# Patient Record
Sex: Male | Born: 1945 | Hispanic: No | Marital: Single | Smoking: Never smoker
Health system: Southern US, Community
[De-identification: ages and names within clinical notes are randomized; demographics above are authoritative.]

## PROBLEM LIST (undated history)

## (undated) DIAGNOSIS — D332 Benign neoplasm of brain, unspecified: Secondary | ICD-10-CM

---

## 2017-08-26 ENCOUNTER — Other Ambulatory Visit: Payer: Self-pay

## 2017-08-26 ENCOUNTER — Inpatient Hospital Stay
Admission: EM | Admit: 2017-08-26 | Discharge: 2017-09-09 | DRG: 100 | Disposition: A | Discharge status: Skilled Nursing Facility | Payer: Medicare Other | Attending: Internal Medicine | Admitting: Internal Medicine

## 2017-08-26 ENCOUNTER — Emergency Department: Payer: Medicare Other

## 2017-08-26 ENCOUNTER — Encounter: Payer: Self-pay | Admitting: *Deleted

## 2017-08-26 DIAGNOSIS — R569 Unspecified convulsions: Secondary | ICD-10-CM

## 2017-08-26 DIAGNOSIS — R0902 Hypoxemia: Secondary | ICD-10-CM

## 2017-08-26 DIAGNOSIS — G40901 Epilepsy, unspecified, not intractable, with status epilepticus: Principal | ICD-10-CM

## 2017-08-26 DIAGNOSIS — G6281 Critical illness polyneuropathy: Secondary | ICD-10-CM | POA: Diagnosis present

## 2017-08-26 DIAGNOSIS — R45851 Suicidal ideations: Secondary | ICD-10-CM | POA: Diagnosis not present

## 2017-08-26 DIAGNOSIS — F4323 Adjustment disorder with mixed anxiety and depressed mood: Secondary | ICD-10-CM

## 2017-08-26 DIAGNOSIS — G7281 Critical illness myopathy: Secondary | ICD-10-CM | POA: Diagnosis present

## 2017-08-26 DIAGNOSIS — F341 Dysthymic disorder: Secondary | ICD-10-CM

## 2017-08-26 DIAGNOSIS — R1310 Dysphagia, unspecified: Secondary | ICD-10-CM | POA: Diagnosis present

## 2017-08-26 DIAGNOSIS — I959 Hypotension, unspecified: Secondary | ICD-10-CM | POA: Diagnosis present

## 2017-08-26 DIAGNOSIS — Z931 Gastrostomy status: Secondary | ICD-10-CM

## 2017-08-26 DIAGNOSIS — N390 Urinary tract infection, site not specified: Secondary | ICD-10-CM | POA: Diagnosis present

## 2017-08-26 DIAGNOSIS — N179 Acute kidney failure, unspecified: Secondary | ICD-10-CM | POA: Diagnosis present

## 2017-08-26 DIAGNOSIS — Z4659 Encounter for fitting and adjustment of other gastrointestinal appliance and device: Secondary | ICD-10-CM

## 2017-08-26 DIAGNOSIS — Z7902 Long term (current) use of antithrombotics/antiplatelets: Secondary | ICD-10-CM

## 2017-08-26 DIAGNOSIS — G9341 Metabolic encephalopathy: Secondary | ICD-10-CM | POA: Diagnosis present

## 2017-08-26 DIAGNOSIS — Z0189 Encounter for other specified special examinations: Secondary | ICD-10-CM

## 2017-08-26 DIAGNOSIS — Z01818 Encounter for other preprocedural examination: Secondary | ICD-10-CM

## 2017-08-26 DIAGNOSIS — Z1389 Encounter for screening for other disorder: Secondary | ICD-10-CM

## 2017-08-26 DIAGNOSIS — T17908A Unspecified foreign body in respiratory tract, part unspecified causing other injury, initial encounter: Secondary | ICD-10-CM

## 2017-08-26 DIAGNOSIS — Z79899 Other long term (current) drug therapy: Secondary | ICD-10-CM

## 2017-08-26 DIAGNOSIS — Z9114 Patient's other noncompliance with medication regimen: Secondary | ICD-10-CM

## 2017-08-26 DIAGNOSIS — Z7984 Long term (current) use of oral hypoglycemic drugs: Secondary | ICD-10-CM

## 2017-08-26 DIAGNOSIS — E1165 Type 2 diabetes mellitus with hyperglycemia: Secondary | ICD-10-CM | POA: Diagnosis present

## 2017-08-26 DIAGNOSIS — R531 Weakness: Secondary | ICD-10-CM | POA: Diagnosis not present

## 2017-08-26 DIAGNOSIS — Z882 Allergy status to sulfonamides status: Secondary | ICD-10-CM

## 2017-08-26 DIAGNOSIS — F0280 Dementia in other diseases classified elsewhere without behavioral disturbance: Secondary | ICD-10-CM

## 2017-08-26 DIAGNOSIS — R0600 Dyspnea, unspecified: Secondary | ICD-10-CM

## 2017-08-26 DIAGNOSIS — J9690 Respiratory failure, unspecified, unspecified whether with hypoxia or hypercapnia: Secondary | ICD-10-CM

## 2017-08-26 DIAGNOSIS — Z452 Encounter for adjustment and management of vascular access device: Secondary | ICD-10-CM

## 2017-08-26 DIAGNOSIS — J9600 Acute respiratory failure, unspecified whether with hypoxia or hypercapnia: Secondary | ICD-10-CM

## 2017-08-26 DIAGNOSIS — R0989 Other specified symptoms and signs involving the circulatory and respiratory systems: Secondary | ICD-10-CM

## 2017-08-26 DIAGNOSIS — D496 Neoplasm of unspecified behavior of brain: Secondary | ICD-10-CM | POA: Diagnosis present

## 2017-08-26 DIAGNOSIS — I10 Essential (primary) hypertension: Secondary | ICD-10-CM | POA: Diagnosis present

## 2017-08-26 DIAGNOSIS — F05 Delirium due to known physiological condition: Secondary | ICD-10-CM | POA: Diagnosis not present

## 2017-08-26 DIAGNOSIS — R739 Hyperglycemia, unspecified: Secondary | ICD-10-CM

## 2017-08-26 DIAGNOSIS — T17908D Unspecified foreign body in respiratory tract, part unspecified causing other injury, subsequent encounter: Secondary | ICD-10-CM

## 2017-08-26 DIAGNOSIS — R0602 Shortness of breath: Secondary | ICD-10-CM

## 2017-08-26 DIAGNOSIS — B952 Enterococcus as the cause of diseases classified elsewhere: Secondary | ICD-10-CM | POA: Diagnosis present

## 2017-08-26 DIAGNOSIS — J9601 Acute respiratory failure with hypoxia: Secondary | ICD-10-CM | POA: Diagnosis present

## 2017-08-26 DIAGNOSIS — E785 Hyperlipidemia, unspecified: Secondary | ICD-10-CM | POA: Diagnosis present

## 2017-08-26 HISTORY — DX: Benign neoplasm of brain, unspecified: D33.2

## 2017-08-26 LAB — BASIC METABOLIC PANEL
ANION GAP: 20 — AB (ref 5–15)
BUN: 17 mg/dL (ref 6–20)
CO2: 16 mmol/L — AB (ref 22–32)
Calcium: 9.1 mg/dL (ref 8.9–10.3)
Chloride: 102 mmol/L (ref 101–111)
Creatinine, Ser: 1.34 mg/dL — ABNORMAL HIGH (ref 0.61–1.24)
GFR calc Af Amer: 60 mL/min — ABNORMAL LOW (ref >60)
GFR calc non Af Amer: 52 mL/min — ABNORMAL LOW (ref >60)
GLUCOSE: 445 mg/dL — AB (ref 65–99)
POTASSIUM: 3.8 mmol/L (ref 3.5–5.1)
Sodium: 138 mmol/L (ref 135–145)

## 2017-08-26 LAB — CBC WITH DIFFERENTIAL/PLATELET
Basophils Absolute: 0.1 10*3/uL (ref 0–0.1)
Basophils Relative: 1 %
EOS ABS: 0.2 10*3/uL (ref 0–0.7)
Eosinophils Relative: 2 %
HCT: 48.8 % (ref 40.0–52.0)
HEMOGLOBIN: 16.2 g/dL (ref 13.0–18.0)
LYMPHS PCT: 30 %
Lymphs Abs: 3.4 10*3/uL (ref 1.0–3.6)
MCH: 29.8 pg (ref 26.0–34.0)
MCHC: 33.2 g/dL (ref 32.0–36.0)
MCV: 89.8 fL (ref 80.0–100.0)
MONO ABS: 0.8 10*3/uL (ref 0.2–1.0)
MONOS PCT: 7 %
NEUTROS ABS: 6.8 10*3/uL — AB (ref 1.4–6.5)
Neutrophils Relative %: 60 %
Platelets: 212 10*3/uL (ref 150–440)
RBC: 5.43 MIL/uL (ref 4.40–5.90)
RDW: 13.4 % (ref 11.5–14.5)
WBC: 11.3 10*3/uL — ABNORMAL HIGH (ref 3.8–10.6)

## 2017-08-26 LAB — PROTIME-INR
INR: 1
PROTHROMBIN TIME: 13.1 s (ref 11.4–15.2)

## 2017-08-26 LAB — GLUCOSE, CAPILLARY: GLUCOSE-CAPILLARY: 297 mg/dL — AB (ref 65–99)

## 2017-08-26 LAB — APTT: aPTT: 26 seconds (ref 24–36)

## 2017-08-26 MED ORDER — LORAZEPAM 2 MG/ML IJ SOLN
INTRAMUSCULAR | Status: AC
  Administered 2017-08-26: 2 mg via INTRAVENOUS
  Filled 2017-08-26: qty 1

## 2017-08-26 MED ORDER — LORAZEPAM 2 MG/ML IJ SOLN
INTRAMUSCULAR | Status: AC
  Administered 2017-08-26: 2 mg
  Filled 2017-08-26: qty 1

## 2017-08-26 MED ORDER — SODIUM CHLORIDE 0.9 % IV SOLN
2000.0000 mg | Freq: Once | INTRAVENOUS | Status: AC
  Administered 2017-08-26: 2000 mg via INTRAVENOUS
  Filled 2017-08-26: qty 40

## 2017-08-26 MED ORDER — ROCURONIUM BROMIDE 50 MG/5ML IV SOLN: 1.0000 mg/kg | Freq: Once | INTRAVENOUS | Status: DC

## 2017-08-26 MED ORDER — PROPOFOL 10 MG/ML IV BOLUS
200.0000 mg | Freq: Once | INTRAVENOUS | Status: AC
  Administered 2017-08-26: 200 mg via INTRAVENOUS

## 2017-08-26 MED ORDER — ROCURONIUM BROMIDE 50 MG/5ML IV SOLN
100.0000 mg | Freq: Once | INTRAVENOUS | Status: AC
  Administered 2017-08-26: 100 mg via INTRAVENOUS

## 2017-08-26 MED ORDER — SODIUM CHLORIDE 0.9 % IV BOLUS (SEPSIS)
1000.0000 mL | Freq: Once | INTRAVENOUS | Status: AC
  Administered 2017-08-26: 1000 mL via INTRAVENOUS

## 2017-08-26 MED ORDER — PROPOFOL 1000 MG/100ML IV EMUL
5.0000 ug/kg/min | Freq: Once | INTRAVENOUS | Status: AC
  Administered 2017-08-26: 20 ug/kg/min via INTRAVENOUS

## 2017-08-26 MED ORDER — LORAZEPAM 2 MG/ML IJ SOLN
2.0000 mg | Freq: Once | INTRAMUSCULAR | Status: AC
  Administered 2017-08-26: 2 mg via INTRAVENOUS

## 2017-08-26 MED ORDER — PROPOFOL 1000 MG/100ML IV EMUL
INTRAVENOUS | Status: AC
Start: 2017-08-26 — End: 2017-08-26
  Administered 2017-08-26: 20 ug/kg/min via INTRAVENOUS
  Filled 2017-08-26: qty 100

## 2017-08-26 MED ORDER — SODIUM CHLORIDE 0.9 % IV SOLN
Freq: Once | INTRAVENOUS | Status: DC
Start: 2017-08-27 — End: 2017-08-27

## 2017-08-26 NOTE — ED Triage Notes (Signed)
Per EMS Pt was at [employer] in rest room and bystander heard him fall and opened stall to see pt having seizure like activity. 911 was called and they arrived to pt postictal. Pt now states no seizure or stoke history. EMS reports he is improving each minute with memory and behavior. Stroke screen in field WNL. Pt is diabetic glucose was 384.

## 2017-08-26 NOTE — ED Notes (Signed)
Rocuronium Bromide administered/given at 2332

## 2017-08-26 NOTE — ED Provider Notes (Signed)
[facility] Emergency Department Provider Note  ____________________________________________  Time seen: Approximately 10:22 PM  I have reviewed the triage vital signs and the nursing notes.   HISTORY  Chief Complaint Seizures    HPI Andrew Horton is a 71 y.o. male with a reported history of brain tumor and diabetes who was in his usual state of health when he is at a gas station bathroom and fell and was observed to be having seizure-like activity. Patient reports he's never had a seizure before. Denies recent trauma fevers chills or neck pain or stiffness.  Further history obtained by a friend who knows the patient well reports that he is often noncompliant with his medications. No family in the area.     Past Medical History:  Diagnosis Date  . Brain tumor (benign) ([place])      There are no active problems to display for this patient.    No past surgical history on file. None  Prior to Admission medications   Not on File  Not available, waiting for records faxed from [facility].   Allergies Sulfa antibiotics   History reviewed. No pertinent family history.  Social History Social History   Tobacco Use  . Smoking status: Never Smoker  . Smokeless tobacco: Never Used  Substance Use Topics  . Alcohol use: No    Frequency: Never  . Drug use: No    Review of Systems  Constitutional:   No fever or chills.  ENT:   No sore throat. No rhinorrhea. Cardiovascular:   No chest pain or syncope. Respiratory:   No dyspnea or cough. Gastrointestinal:   Negative for abdominal pain, vomiting and diarrhea.  Musculoskeletal:   Negative for focal pain or swelling All other systems reviewed and are negative except as documented above in ROS and HPI.  ____________________________________________   PHYSICAL EXAM:  VITAL SIGNS: ED Triage Vitals [08/26/17 2117]  Enc Vitals Group     BP      Pulse      Resp      Temp      Temp src      SpO2       Weight 235 lb (106.6 kg)     Height 5\' 9"  (1.753 m)     Head Circumference      Peak Flow      Pain Score      Pain Loc      Pain Edu?      Excl. in [place]?     Vital signs reviewed, nursing assessments reviewed.   Constitutional:   Alert and oriented. Well appearing and in no distress. Eyes:   No scleral icterus.  EOMI. No nystagmus. No conjunctival pallor. PERRL. ENT   Head:   Normocephalic and atraumatic.   Nose:   No congestion/rhinnorhea.    Mouth/Throat:   MMM, no pharyngeal erythema. No peritonsillar mass.    Neck:   No meningismus. Full ROM. Hematological/Lymphatic/Immunilogical:   No cervical lymphadenopathy. Cardiovascular:   RRR. Symmetric bilateral radial and DP pulses.  No murmurs.  Respiratory:   Normal respiratory effort without tachypnea/retractions. Breath sounds are clear and equal bilaterally. No wheezes/rales/rhonchi. Gastrointestinal:   Soft and nontender. Non distended. There is no CVA tenderness.  No rebound, rigidity, or guarding. Genitourinary:   deferred Musculoskeletal:   Normal range of motion in all extremities. No joint effusions.  No lower extremity tenderness.  No edema. Neurologic:   Normal speech and language.  Motor grossly intact. No gross focal  neurologic deficits are appreciated.  Skin:    Skin is warm, dry and intact. No rash noted.  No petechiae, purpura, or bullae.  ____________________________________________    LABS (pertinent positives/negatives) (all labs ordered are listed, but only abnormal results are displayed) Labs Reviewed  BASIC METABOLIC PANEL - Abnormal; Notable for the following components:      Result Value   CO2 16 (*)    Glucose, Bld 445 (*)    Creatinine, Ser 1.34 (*)    GFR calc non Af Amer 52 (*)    GFR calc Af Amer 60 (*)    Anion gap 20 (*)    All other components within normal limits  CBC WITH DIFFERENTIAL/PLATELET - Abnormal; Notable for the following components:   WBC 11.3 (*)    Neutro Abs  6.8 (*)    All other components within normal limits  GLUCOSE, CAPILLARY - Abnormal; Notable for the following components:   Glucose-Capillary 297 (*)    All other components within normal limits  BLOOD GAS, ARTERIAL - Abnormal; Notable for the following components:   pH, Arterial 7.33 (*)    pO2, Arterial 73 (*)    Acid-base deficit 3.3 (*)    All other components within normal limits  PROTIME-INR  APTT   ____________________________________________   EKG    ____________________________________________    RADIOLOGY  Ct Head Wo Contrast  Result Date: 08/26/2017 CLINICAL DATA:  Seizures. Patient was uncooperative despite sedation. History of diabetes. EXAM: CT HEAD WITHOUT CONTRAST TECHNIQUE: Contiguous axial images were obtained from the base of the skull through the vertex without intravenous contrast. COMPARISON:  None. FINDINGS: Brain: Examination is technically limited due to patient motion. There is severe limitation of visualization in some areas. As visualized, there is no gross evidence of acute intracranial hemorrhage or mass effect. There is bilateral cerebral atrophy with ventricular dilatation likely due to central atrophy. No abnormal extra-axial fluid collections are appreciated. Vascular: Vascular calcifications are present. Skull: Visualize calvarium appears intact. Sinuses/Orbits: Visualized paranasal sinuses and mastoid air cells are clear. Other: None. IMPRESSION: Examination is limited due to motion artifact. No gross evidence of acute intracranial abnormality. Chronic atrophy. Electronically Signed   By: Lucienne Capers M.D.   On: 08/26/2017 23:23    ____________________________________________   PROCEDURES Procedure Name: Intubation Date/Time: 08/27/2017 12:17 AM Performed by: Carrie Mew, MD Pre-anesthesia Checklist: Patient identified, Emergency Drugs available, Suction available, Patient being monitored and Timeout performed Oxygen Delivery  Method: Non-rebreather mask Preoxygenation: Pre-oxygenation with 100% oxygen Induction Type: IV induction and Rapid sequence Ventilation: Mask ventilation without difficulty Laryngoscope Size: Glidescope and 3 Grade View: Grade I Tube size: 7.5 mm Number of attempts: 1 Airway Equipment and Method: Video-laryngoscopy Placement Confirmation: ETT inserted through vocal cords under direct vision,  CO2 detector and Breath sounds checked- equal and bilateral Tube secured with: ETT holder Dental Injury: Teeth and Oropharynx as per pre-operative assessment       CRITICAL CARE Performed by: Joni Fears, Asami Lambright   Total critical care time: 35 minutes  Critical care time was exclusive of separately billable procedures and treating other patients.  Critical care was necessary to treat or prevent imminent or life-threatening deterioration.  Critical care was time spent personally by me on the following activities: development of treatment plan with patient and/or surrogate as well as nursing, discussions with consultants, evaluation of patient's response to treatment, examination of patient, obtaining history from patient or surrogate, ordering and performing treatments and interventions, ordering and review of laboratory studies,  ordering and review of radiographic studies, pulse oximetry and re-evaluation of patient's condition.  ____________________________________________   DIFFERENTIAL DIAGNOSIS  Seizure, intracranial mass effect, intracranial hemorrhage, cerebral edema  CLINICAL IMPRESSION / ASSESSMENT AND PLAN / ED COURSE  Pertinent labs & imaging results that were available during my care of the patient were reviewed by me and considered in my medical decision making (see chart for details).   Patient presents with a seizure at a gas station. Reports a history of brain tumor not sure of any current or prior treatment. Concern for complication of tumor, plan a CT head, check  labs.  Clinical Course as of Aug 27 21  Mon Aug 26, 2017  2146 Seizure in ED, partial in upper extremity and then generalized. Bit tongue, urinary incontinence. Hospital doesn't carry fosphenytoin, so i'll start phenytoin infusion. Ativan 2mg  iv given. So2 dropped to 83% during seizure. If he has recurrence, will need to intubate for airway protection.   [PS]  2208 Still post-ictal / unresponsive. Maintaining airway, so2 100% on nrb  [PS]  2208 Phenytoin arrived, starting infusion  [PS]  2252 Pt mental status improving, but still not back to baseline. Will proceed with CT. Defer intubation at this time.   [PS]  7106 Pt intubated for airway protection due to continued AMS, unable to obtain CT.   [PS]    Clinical Course User Index [PS] Carrie Mew, MD     ----------------------------------------- 12:22 AM on 08/27/2017 -----------------------------------------  Awaiting repeat head CT to evaluate for ICH to determine necessity of transfer versus local admission to ICU for new onset status epilepticus.  ____________________________________________   FINAL CLINICAL IMPRESSION(S) / ED DIAGNOSES    Final diagnoses:  Seizure ([place])  Status epilepticus ([place])  Hyperglycemia      This SmartLink is deprecated. Use AVSMEDLIST instead to display the medication list for a patient.   Portions of this note were generated with dragon dictation software. Dictation errors may occur despite best attempts at proofreading.    Carrie Mew, MD 08/27/17 Benancio Deeds

## 2017-08-27 ENCOUNTER — Inpatient Hospital Stay (HOSPITAL_COMMUNITY): Payer: Medicare Other

## 2017-08-27 DIAGNOSIS — G40901 Epilepsy, unspecified, not intractable, with status epilepticus: Secondary | ICD-10-CM | POA: Diagnosis present

## 2017-08-27 DIAGNOSIS — G6281 Critical illness polyneuropathy: Secondary | ICD-10-CM | POA: Diagnosis present

## 2017-08-27 DIAGNOSIS — R1310 Dysphagia, unspecified: Secondary | ICD-10-CM | POA: Diagnosis present

## 2017-08-27 DIAGNOSIS — N179 Acute kidney failure, unspecified: Secondary | ICD-10-CM | POA: Diagnosis present

## 2017-08-27 DIAGNOSIS — Z7902 Long term (current) use of antithrombotics/antiplatelets: Secondary | ICD-10-CM | POA: Diagnosis not present

## 2017-08-27 DIAGNOSIS — E785 Hyperlipidemia, unspecified: Secondary | ICD-10-CM | POA: Diagnosis present

## 2017-08-27 DIAGNOSIS — F05 Delirium due to known physiological condition: Secondary | ICD-10-CM | POA: Diagnosis not present

## 2017-08-27 DIAGNOSIS — J9601 Acute respiratory failure with hypoxia: Secondary | ICD-10-CM | POA: Diagnosis present

## 2017-08-27 DIAGNOSIS — E1165 Type 2 diabetes mellitus with hyperglycemia: Secondary | ICD-10-CM | POA: Diagnosis present

## 2017-08-27 DIAGNOSIS — I959 Hypotension, unspecified: Secondary | ICD-10-CM | POA: Diagnosis present

## 2017-08-27 DIAGNOSIS — J9600 Acute respiratory failure, unspecified whether with hypoxia or hypercapnia: Secondary | ICD-10-CM

## 2017-08-27 DIAGNOSIS — F329 Major depressive disorder, single episode, unspecified: Secondary | ICD-10-CM | POA: Diagnosis present

## 2017-08-27 DIAGNOSIS — G9341 Metabolic encephalopathy: Secondary | ICD-10-CM | POA: Diagnosis present

## 2017-08-27 DIAGNOSIS — B952 Enterococcus as the cause of diseases classified elsewhere: Secondary | ICD-10-CM | POA: Diagnosis present

## 2017-08-27 DIAGNOSIS — Z7984 Long term (current) use of oral hypoglycemic drugs: Secondary | ICD-10-CM | POA: Diagnosis not present

## 2017-08-27 DIAGNOSIS — Z9114 Patient's other noncompliance with medication regimen: Secondary | ICD-10-CM | POA: Diagnosis not present

## 2017-08-27 DIAGNOSIS — N390 Urinary tract infection, site not specified: Secondary | ICD-10-CM | POA: Diagnosis present

## 2017-08-27 DIAGNOSIS — R569 Unspecified convulsions: Secondary | ICD-10-CM | POA: Diagnosis not present

## 2017-08-27 DIAGNOSIS — I10 Essential (primary) hypertension: Secondary | ICD-10-CM | POA: Diagnosis present

## 2017-08-27 DIAGNOSIS — R45851 Suicidal ideations: Secondary | ICD-10-CM | POA: Diagnosis not present

## 2017-08-27 DIAGNOSIS — R531 Weakness: Secondary | ICD-10-CM | POA: Diagnosis not present

## 2017-08-27 DIAGNOSIS — Z79899 Other long term (current) drug therapy: Secondary | ICD-10-CM | POA: Diagnosis not present

## 2017-08-27 DIAGNOSIS — F4323 Adjustment disorder with mixed anxiety and depressed mood: Secondary | ICD-10-CM | POA: Diagnosis present

## 2017-08-27 DIAGNOSIS — G9340 Encephalopathy, unspecified: Secondary | ICD-10-CM

## 2017-08-27 DIAGNOSIS — Z882 Allergy status to sulfonamides status: Secondary | ICD-10-CM | POA: Diagnosis not present

## 2017-08-27 DIAGNOSIS — D496 Neoplasm of unspecified behavior of brain: Secondary | ICD-10-CM | POA: Diagnosis present

## 2017-08-27 DIAGNOSIS — G7281 Critical illness myopathy: Secondary | ICD-10-CM | POA: Diagnosis present

## 2017-08-27 LAB — BLOOD GAS, ARTERIAL
Acid-base deficit: 3.3 mmol/L — ABNORMAL HIGH (ref 0.0–2.0)
BICARBONATE: 22.7 mmol/L (ref 20.0–28.0)
FIO2: 0.5
LHR: 14 {breaths}/min
O2 SAT: 93.3 %
PATIENT TEMPERATURE: 37
PEEP/CPAP: 5 cmH2O
PO2 ART: 73 mmHg — AB (ref 83.0–108.0)
VT: 500 mL
pCO2 arterial: 43 mmHg (ref 32.0–48.0)
pH, Arterial: 7.33 — ABNORMAL LOW (ref 7.350–7.450)

## 2017-08-27 LAB — HEPATIC FUNCTION PANEL
ALT: 25 U/L (ref 17–63)
AST: 27 U/L (ref 15–41)
Albumin: 3.7 g/dL (ref 3.5–5.0)
Alkaline Phosphatase: 71 U/L (ref 38–126)
BILIRUBIN INDIRECT: 1 mg/dL — AB (ref 0.3–0.9)
Bilirubin, Direct: 0.2 mg/dL (ref 0.1–0.5)
TOTAL PROTEIN: 7 g/dL (ref 6.5–8.1)
Total Bilirubin: 1.2 mg/dL (ref 0.3–1.2)

## 2017-08-27 LAB — GLUCOSE, CAPILLARY
Glucose-Capillary: 128 mg/dL — ABNORMAL HIGH (ref 65–99)
Glucose-Capillary: 135 mg/dL — ABNORMAL HIGH (ref 65–99)
Glucose-Capillary: 143 mg/dL — ABNORMAL HIGH (ref 65–99)
Glucose-Capillary: 148 mg/dL — ABNORMAL HIGH (ref 65–99)
Glucose-Capillary: 151 mg/dL — ABNORMAL HIGH (ref 65–99)
Glucose-Capillary: 160 mg/dL — ABNORMAL HIGH (ref 65–99)
Glucose-Capillary: 165 mg/dL — ABNORMAL HIGH (ref 65–99)
Glucose-Capillary: 170 mg/dL — ABNORMAL HIGH (ref 65–99)
Glucose-Capillary: 174 mg/dL — ABNORMAL HIGH (ref 65–99)
Glucose-Capillary: 205 mg/dL — ABNORMAL HIGH (ref 65–99)
Glucose-Capillary: 249 mg/dL — ABNORMAL HIGH (ref 65–99)
Glucose-Capillary: 358 mg/dL — ABNORMAL HIGH (ref 65–99)
Glucose-Capillary: 434 mg/dL — ABNORMAL HIGH (ref 65–99)
Glucose-Capillary: 438 mg/dL — ABNORMAL HIGH (ref 65–99)
Glucose-Capillary: 460 mg/dL — ABNORMAL HIGH (ref 65–99)
Glucose-Capillary: 469 mg/dL — ABNORMAL HIGH (ref 65–99)
Glucose-Capillary: 486 mg/dL — ABNORMAL HIGH (ref 65–99)
Glucose-Capillary: 523 mg/dL (ref 65–99)
Glucose-Capillary: 533 mg/dL (ref 65–99)

## 2017-08-27 LAB — BASIC METABOLIC PANEL
ANION GAP: 13 (ref 5–15)
BUN: 19 mg/dL (ref 6–20)
CHLORIDE: 104 mmol/L (ref 101–111)
CO2: 18 mmol/L — AB (ref 22–32)
Calcium: 8.5 mg/dL — ABNORMAL LOW (ref 8.9–10.3)
Creatinine, Ser: 1.88 mg/dL — ABNORMAL HIGH (ref 0.61–1.24)
GFR calc Af Amer: 40 mL/min — ABNORMAL LOW (ref >60)
GFR calc non Af Amer: 34 mL/min — ABNORMAL LOW (ref >60)
GLUCOSE: 506 mg/dL — AB (ref 65–99)
POTASSIUM: 3.4 mmol/L — AB (ref 3.5–5.1)
Sodium: 135 mmol/L (ref 135–145)

## 2017-08-27 LAB — MRSA PCR SCREENING: MRSA by PCR: NEGATIVE

## 2017-08-27 LAB — PROCALCITONIN: Procalcitonin: 0.16 ng/mL

## 2017-08-27 LAB — PHOSPHORUS: PHOSPHORUS: 2.8 mg/dL (ref 2.5–4.6)

## 2017-08-27 LAB — TSH: TSH: 6.838 u[IU]/mL — ABNORMAL HIGH (ref 0.350–4.500)

## 2017-08-27 LAB — MAGNESIUM: MAGNESIUM: 2.3 mg/dL (ref 1.7–2.4)

## 2017-08-27 LAB — HEMOGLOBIN A1C
Hgb A1c MFr Bld: 8.4 % — ABNORMAL HIGH (ref 4.8–5.6)
Mean Plasma Glucose: 194.38 mg/dL

## 2017-08-27 MED ORDER — LISINOPRIL 5 MG PO TABS: 5.0000 mg | ORAL_TABLET | Freq: Every day | ORAL | Status: DC

## 2017-08-27 MED ORDER — CHLORHEXIDINE GLUCONATE 0.12 % MT SOLN
15.0000 mL | Freq: Two times a day (BID) | OROMUCOSAL | Status: DC
  Administered 2017-08-27 – 2017-08-28 (×2): 15 mL via OROMUCOSAL
  Filled 2017-08-27: qty 15

## 2017-08-27 MED ORDER — LACTATED RINGERS IV SOLN
INTRAVENOUS | Status: DC
  Administered 2017-08-27: 04:00:00 via INTRAVENOUS

## 2017-08-27 MED ORDER — SODIUM CHLORIDE 0.9 % IV SOLN
100.0000 mg | Freq: Three times a day (TID) | INTRAVENOUS | Status: DC
  Administered 2017-08-27: 100 mg via INTRAVENOUS
  Filled 2017-08-27 (×3): qty 50

## 2017-08-27 MED ORDER — FAMOTIDINE IN NACL 20-0.9 MG/50ML-% IV SOLN: 20.0000 mg | Freq: Two times a day (BID) | INTRAVENOUS | Status: DC

## 2017-08-27 MED ORDER — HYDRALAZINE HCL 20 MG/ML IJ SOLN
10.0000 mg | INTRAMUSCULAR | Status: DC | PRN
  Administered 2017-08-27: 20 mg via INTRAVENOUS
  Filled 2017-08-27: qty 1

## 2017-08-27 MED ORDER — SODIUM CHLORIDE 0.9 % IV SOLN
INTRAVENOUS | Status: DC
  Administered 2017-08-27: 4.7 [IU]/h via INTRAVENOUS
  Administered 2017-08-27: 8.7 [IU]/h via INTRAVENOUS
  Filled 2017-08-27 (×2): qty 1

## 2017-08-27 MED ORDER — ACETAMINOPHEN 325 MG PO TABS: 650.0000 mg | ORAL_TABLET | Freq: Four times a day (QID) | ORAL | Status: DC | PRN

## 2017-08-27 MED ORDER — INSULIN GLARGINE 100 UNIT/ML ~~LOC~~ SOLN
20.0000 [IU] | Freq: Every day | SUBCUTANEOUS | Status: DC
  Administered 2017-08-27: 20 [IU] via SUBCUTANEOUS
  Filled 2017-08-27 (×2): qty 0.2

## 2017-08-27 MED ORDER — LINAGLIPTIN 5 MG PO TABS: 5.0000 mg | ORAL_TABLET | Freq: Every day | ORAL | Status: DC

## 2017-08-27 MED ORDER — ATORVASTATIN CALCIUM 20 MG PO TABS: 80.0000 mg | ORAL_TABLET | Freq: Every day | ORAL | Status: DC

## 2017-08-27 MED ORDER — CLOPIDOGREL BISULFATE 75 MG PO TABS: 75.0000 mg | ORAL_TABLET | Freq: Every day | ORAL | Status: DC

## 2017-08-27 MED ORDER — PHENYTOIN SODIUM 50 MG/ML IJ SOLN
100.0000 mg | Freq: Three times a day (TID) | INTRAMUSCULAR | Status: DC
Start: 2017-08-27 — End: 2017-08-27

## 2017-08-27 MED ORDER — BROMOCRIPTINE MESYLATE 2.5 MG PO TABS
12.5000 mg | ORAL_TABLET | Freq: Every day | ORAL | Status: DC
  Filled 2017-08-27: qty 5

## 2017-08-27 MED ORDER — DOCUSATE SODIUM 100 MG PO CAPS: 100.0000 mg | ORAL_CAPSULE | Freq: Two times a day (BID) | ORAL | Status: DC

## 2017-08-27 MED ORDER — SODIUM CHLORIDE 0.9 % IV SOLN
1000.0000 mg | Freq: Two times a day (BID) | INTRAVENOUS | Status: DC
  Administered 2017-08-27 – 2017-08-30 (×7): 1000 mg via INTRAVENOUS
  Filled 2017-08-27 (×9): qty 10

## 2017-08-27 MED ORDER — ORAL CARE MOUTH RINSE
15.0000 mL | OROMUCOSAL | Status: DC
  Administered 2017-08-27 (×2): 15 mL via OROMUCOSAL

## 2017-08-27 MED ORDER — PROPOFOL 1000 MG/100ML IV EMUL
5.0000 ug/kg/min | INTRAVENOUS | Status: DC
  Administered 2017-08-27: 75 ug/kg/min via INTRAVENOUS
  Administered 2017-08-27: 65 ug/kg/min via INTRAVENOUS
  Filled 2017-08-27 (×2): qty 100

## 2017-08-27 MED ORDER — METOPROLOL TARTRATE 5 MG/5ML IV SOLN
5.0000 mg | Freq: Four times a day (QID) | INTRAVENOUS | Status: DC | PRN
  Administered 2017-08-27: 5 mg via INTRAVENOUS
  Filled 2017-08-27: qty 5

## 2017-08-27 MED ORDER — NITROGLYCERIN 2 % TD OINT
0.5000 [in_us] | TOPICAL_OINTMENT | Freq: Once | TRANSDERMAL | Status: AC
  Administered 2017-08-27: 0.5 [in_us] via TOPICAL
  Filled 2017-08-27: qty 1

## 2017-08-27 MED ORDER — FENTANYL CITRATE (PF) 100 MCG/2ML IJ SOLN
100.0000 ug | Freq: Once | INTRAMUSCULAR | Status: AC
  Administered 2017-08-27: 100 ug via INTRAVENOUS

## 2017-08-27 MED ORDER — PROPOFOL 1000 MG/100ML IV EMUL
INTRAVENOUS | Status: AC
  Filled 2017-08-27: qty 100

## 2017-08-27 MED ORDER — FAMOTIDINE 20 MG PO TABS: 20.0000 mg | ORAL_TABLET | Freq: Two times a day (BID) | ORAL | Status: DC

## 2017-08-27 MED ORDER — CHLORHEXIDINE GLUCONATE 0.12% ORAL RINSE (MEDLINE KIT)
15.0000 mL | Freq: Two times a day (BID) | OROMUCOSAL | Status: DC
  Administered 2017-08-27: 15 mL via OROMUCOSAL

## 2017-08-27 MED ORDER — FAMOTIDINE IN NACL 20-0.9 MG/50ML-% IV SOLN
20.0000 mg | Freq: Two times a day (BID) | INTRAVENOUS | Status: DC
  Administered 2017-08-27: 20 mg via INTRAVENOUS
  Filled 2017-08-27: qty 50

## 2017-08-27 MED ORDER — SODIUM CHLORIDE 0.9 % IV SOLN: INTRAVENOUS | Status: DC

## 2017-08-27 MED ORDER — DEXTROSE 10 % IV SOLN: INTRAVENOUS | Status: DC | PRN

## 2017-08-27 MED ORDER — INSULIN GLARGINE 100 UNIT/ML ~~LOC~~ SOLN
20.0000 [IU] | Freq: Every day | SUBCUTANEOUS | Status: DC
  Filled 2017-08-27: qty 0.2

## 2017-08-27 MED ORDER — LISINOPRIL 5 MG PO TABS
5.0000 mg | ORAL_TABLET | Freq: Every day | ORAL | Status: DC
  Administered 2017-08-27: 5 mg
  Filled 2017-08-27: qty 1

## 2017-08-27 MED ORDER — ORAL CARE MOUTH RINSE
15.0000 mL | Freq: Two times a day (BID) | OROMUCOSAL | Status: DC
  Administered 2017-08-27: 15 mL via OROMUCOSAL

## 2017-08-27 MED ORDER — ONDANSETRON HCL 4 MG/2ML IJ SOLN: 4.0000 mg | Freq: Four times a day (QID) | INTRAMUSCULAR | Status: DC | PRN

## 2017-08-27 MED ORDER — LORAZEPAM 2 MG/ML IJ SOLN
2.0000 mg | INTRAMUSCULAR | Status: DC | PRN
  Administered 2017-09-04: 2 mg via INTRAVENOUS
  Filled 2017-08-27: qty 1

## 2017-08-27 MED ORDER — HEPARIN SODIUM (PORCINE) 5000 UNIT/ML IJ SOLN
5000.0000 [IU] | Freq: Three times a day (TID) | INTRAMUSCULAR | Status: DC
  Administered 2017-08-27 – 2017-08-28 (×4): 5000 [IU] via SUBCUTANEOUS
  Filled 2017-08-27 (×4): qty 1

## 2017-08-27 MED ORDER — INSULIN ASPART 100 UNIT/ML ~~LOC~~ SOLN: 0.0000 [IU] | Freq: Every day | SUBCUTANEOUS | Status: DC

## 2017-08-27 MED ORDER — DEXMEDETOMIDINE HCL IN NACL 400 MCG/100ML IV SOLN
0.4000 ug/kg/h | INTRAVENOUS | Status: DC
  Administered 2017-08-27: 1.2 ug/kg/h via INTRAVENOUS
  Administered 2017-08-27 (×2): 0.6 ug/kg/h via INTRAVENOUS
  Filled 2017-08-27 (×3): qty 100

## 2017-08-27 MED ORDER — ONDANSETRON HCL 4 MG PO TABS: 4.0000 mg | ORAL_TABLET | Freq: Four times a day (QID) | ORAL | Status: DC | PRN

## 2017-08-27 MED ORDER — INSULIN ASPART 100 UNIT/ML ~~LOC~~ SOLN
2.0000 [IU] | SUBCUTANEOUS | Status: DC
  Administered 2017-08-28: 4 [IU] via SUBCUTANEOUS
  Filled 2017-08-27: qty 1

## 2017-08-27 MED ORDER — LACTATED RINGERS IV SOLN
INTRAVENOUS | Status: DC
  Administered 2017-08-27 – 2017-08-28 (×2): via INTRAVENOUS

## 2017-08-27 MED ORDER — VECURONIUM BROMIDE 10 MG IV SOLR
10.0000 mg | Freq: Once | INTRAVENOUS | Status: AC
  Administered 2017-08-27: 10 mg via INTRAVENOUS

## 2017-08-27 MED ORDER — ACETAMINOPHEN 650 MG RE SUPP
650.0000 mg | Freq: Four times a day (QID) | RECTAL | Status: DC | PRN
  Administered 2017-08-30 – 2017-09-05 (×5): 650 mg via RECTAL
  Filled 2017-08-27 (×5): qty 1

## 2017-08-27 MED ORDER — INSULIN ASPART 100 UNIT/ML ~~LOC~~ SOLN: 0.0000 [IU] | Freq: Three times a day (TID) | SUBCUTANEOUS | Status: DC

## 2017-08-27 MED ORDER — LORAZEPAM 2 MG/ML IJ SOLN
1.0000 mg | INTRAMUSCULAR | Status: DC | PRN
  Administered 2017-08-27: 2 mg via INTRAVENOUS
  Filled 2017-08-27: qty 1

## 2017-08-27 NOTE — Progress Notes (Signed)
Pt doesn't have any close family members, has sister that lives in another country have not spoken in over 39 years. Pt has close friend Earmon Phoenix [phone]) for many years who is his closest friend/family. Pt lives in [place], [place], [place], [address]. Remo Lipps states that pt is non complaint with his meds and that he has a heart condition (unknown), brain tumor, diabetes and etc. No kids, spouse. All information came from [place].

## 2017-08-27 NOTE — ED Notes (Signed)
Pt still unable to remain still for scan. Charge nurse notified.

## 2017-08-27 NOTE — ED Provider Notes (Signed)
-----------------------------------------   2:02 AM on 08/27/2017 -----------------------------------------   Blood pressure (!) 145/62, pulse (!) 119, resp. rate (!) 24, height 5\' 9"  (1.753 m), weight 106.6 kg (235 lb), SpO2 100 %.  Assuming care from Dr. Joni Fears.  In short, Andrew Horton is a 71 y.o. male with a chief complaint of Seizures .  Refer to the original H&P for additional details.  The current plan of care is to follow up the results of the repeat CT scan.  The patient's repeat CT scan did not show any acute intracranial abnormalities.  He will be admitted to the hospitalist service in the ICU   Clinical Course as of Aug 27 201  Molli Knock Aug 26, 2017  2146 Seizure in ED, partial in upper extremity and then generalized. Bit tongue, urinary incontinence. Hospital doesn't carry fosphenytoin, so i'll start phenytoin infusion. Ativan 2mg  iv given. So2 dropped to 83% during seizure. If he has recurrence, will need to intubate for airway protection.   [PS]  2208 Still post-ictal / unresponsive. Maintaining airway, so2 100% on nrb  [PS]  2208 Phenytoin arrived, starting infusion  [PS]  2252 Pt mental status improving, but still not back to baseline. Will proceed with CT. Defer intubation at this time.   [PS]  4403 Pt intubated for airway protection due to continued AMS, unable to obtain CT.   [PS]    Clinical Course User Index [PS] Carrie Mew, MD      Loney Hering, MD 08/27/17 [phone]

## 2017-08-27 NOTE — Progress Notes (Signed)
CONCERNING: IV to Oral Route Change Policy  RECOMMENDATION: This patient is receiving famotidine by the intravenous route.  Based on criteria approved by the Pharmacy and Therapeutics Committee, the intravenous medication(s) is/are being converted to the equivalent oral dose form(s).   DESCRIPTION: These criteria include:  The patient is eating (either orally or via tube) and/or has been taking other orally administered medications for a least 24 hours  The patient has no evidence of active gastrointestinal bleeding or impaired GI absorption (gastrectomy, short bowel, patient on TNA or NPO).  If you have questions about this conversion, please contact the Pharmacy Department  []   [phone] )  [facility] [x]   [phone] )  [facility] []   [phone] )  [facility] []   [phone] )  [facility] []   [phone] )  [place], [facility] 08/27/2017 9:00 AM

## 2017-08-27 NOTE — ED Notes (Signed)
Pt continues to show signs of posturing, MD decided to intubate to protect/maintain airway. Pt tossing and turning placing arms straight out in front of him or over head to the side.

## 2017-08-27 NOTE — ED Notes (Signed)
Pt starting having a seizure. Ativan ordered by MD

## 2017-08-27 NOTE — ED Notes (Signed)
Pt still moving arms, with  legs and body  twitching

## 2017-08-27 NOTE — ED Notes (Signed)
Pt moving arms and twitching increased propofol to 50 mcg/kg/min

## 2017-08-27 NOTE — ED Notes (Signed)
Pt returned from CT °

## 2017-08-27 NOTE — Progress Notes (Signed)
Notified that patient unable to have contrast with MRI d/t kidney function. Dr. Doy Mince notified, stated that patient could have MRI without contrast, and MRI could be postponed to tomorrow d/t patient's AMS. Also notified Dr. Alva Garnet that patient needs ABD and pelvic xray (per MRI) before MRI can be completed because no family is available for history. Patient extubated this morning, no respiratory distress during shift, however mental status has not improved, unable to follow commands and lethargic throughout shift, will withdraw from pain.  Wilnette Kales

## 2017-08-27 NOTE — Consult Note (Signed)
Reason for Consult:Status epilepticus Referring Physician: Posey Pronto  CC: Seizures  HPI: Andrew Horton is an 71 y.o. male who is altered and unable to provide any history today.  No family available therefore all history obtained from the chart.  Patient with a history of DM and prolactinoma, noncompliant with all medications who was found in the bathroom of a gas station in a postictal state.  Upon arrival to the emergency department the patient was verbal but again seized.  He received Ativan but seized yet again. Patient was intubated for airway protection.   Patient was loaded with Dilantin.  Has been changed to [place] today and successfully extubated.  Has not returned to baseline mental status.  Consult called for further recommendations.    Past Medical History:  Diagnosis Date  . Brain tumor (benign) ([place])   PTSD, HLD, DM, prolactinoma, hypogonadism, HTN  Family history: Maternal grandmother with glaucoma and DM.  Paternal GF with DM  Social History:  reports that  has never smoked. he has never used smokeless tobacco. He reports that he does not drink alcohol or use drugs.  Allergies  Allergen Reactions  . Sulfa Antibiotics Hives    Medications:  I have reviewed the patient's current medications. Prior to Admission:  Medications Prior to Admission  Medication Sig Dispense Refill Last Dose  . atorvastatin (LIPITOR) 80 MG tablet Take 80 mg daily by mouth.   unknown at unknown  . bromocriptine (PARLODEL) 2.5 MG tablet Take 12.5 mg daily by mouth.   unknown at unknown  . clopidogrel (PLAVIX) 75 MG tablet Take 75 mg daily by mouth.   unknown at unknown  . linagliptin (TRADJENTA) 5 MG TABS tablet Take 5 mg daily by mouth.   unknown at unknown  . lisinopril (PRINIVIL,ZESTRIL) 5 MG tablet Take 5 mg daily by mouth.   unknown at unknown  . metFORMIN (GLUCOPHAGE-XR) 500 MG 24 hr tablet Take 1,000 mg 2 (two) times daily by mouth.   unknown at unknown  . metoprolol succinate (TOPROL-XL)  25 MG 24 hr tablet Take 25 mg daily by mouth.   unknown at unknown  . nitroGLYCERIN (NITROSTAT) 0.4 MG SL tablet Place 0.4 mg every 5 (five) minutes as needed under the tongue for chest pain.   prn at prn   Scheduled: . [START ON 08/28/2017] atorvastatin  80 mg Oral Daily  . [START ON 08/28/2017] bromocriptine  12.5 mg Oral Daily  . chlorhexidine  15 mL Mouth Rinse BID  . [START ON 08/28/2017] clopidogrel  75 mg Oral Daily  . heparin  5,000 Units Subcutaneous Q8H  . mouth rinse  15 mL Mouth Rinse q12n4p    ROS: Unable to provide due to mental status  Physical Examination: Blood pressure 96/75, pulse 98, temperature 98.2 F (36.8 C), temperature source Oral, resp. rate 13, height 5\' 9"  (1.753 m), weight 104.3 kg (229 lb 15 oz), SpO2 99 %.  HEENT-  Normocephalic, no lesions, without obvious abnormality.  Normal external eye and conjunctiva.  Normal TM's bilaterally.  Normal auditory canals and external ears. Normal external nose, mucus membranes and septum.  Normal pharynx. Cardiovascular- S1, S2 normal, pulses palpable throughout   Lungs- chest clear, no wheezing, rales, normal symmetric air entry Abdomen- soft, non-tender; bowel sounds normal; no masses,  no organomegaly Extremities- no edema Lymph-no adenopathy palpable Musculoskeletal-no joint tenderness, deformity or swelling Skin-warm and dry, no hyperpigmentation, vitiligo, or suspicious lesions  Neurological Examination   Mental Status: Eyes closed.  Agitated.  Does not  follow commands.  No speech. Cranial Nerves: II: Discs flat bilaterally; Does not blink to confrontationl, pupils equal, round, reactive to light and accommodation III,IV, VI: intact oculocephalic maneuvers V,VII: weak corneals bilaterally VIII: unable to test IX,X: gag reflex present XI: unable to test XII: unable to test Motor: Moves all extremities against gravity with no focal weakness noted.  Unable to perform formal testing due to mental  status Sensory: Responds to noxious stimuli throughout Deep Tendon Reflexes: 2+ in the upper extremities and absent in the lower extremities Plantars: Right: upgoing   Left: upgoing Cerebellar: Unable to perform due to mental status Gait: unable to perform due to mental status    Laboratory Studies:   Basic Metabolic Panel: Recent Labs  Lab 08/26/17 Jan 14, 2200 08/27/17 1022  NA 138 135  K 3.8 3.4*  CL 102 104  CO2 16* 18*  GLUCOSE 445* 506*  BUN 17 19  CREATININE 1.34* 1.88*  CALCIUM 9.1 8.5*    Liver Function Tests: No results for input(s): AST, ALT, ALKPHOS, BILITOT, PROT, ALBUMIN in the last 168 hours. No results for input(s): LIPASE, AMYLASE in the last 168 hours. No results for input(s): AMMONIA in the last 168 hours.  CBC: Recent Labs  Lab 08/26/17 2201  WBC 11.3*  NEUTROABS 6.8*  HGB 16.2  HCT 48.8  MCV 89.8  PLT 212    Cardiac Enzymes: No results for input(s): CKTOTAL, CKMB, CKMBINDEX, TROPONINI in the last 168 hours.  BNP: Invalid input(s): POCBNP  CBG: Recent Labs  Lab 08/27/17 0911 08/27/17 1010 08/27/17 1113 08/27/17 1208 08/27/17 1312  GLUCAP 469* 486* 434* 70* 249*    Microbiology: Results for orders placed or performed during the hospital encounter of 08/26/17  MRSA PCR Screening     Status: None   Collection Time: 08/27/17  3:24 AM  Result Value Ref Range Status   MRSA by PCR NEGATIVE NEGATIVE Final    Comment:        The GeneXpert MRSA Assay (FDA approved for NASAL specimens only), is one component of a comprehensive MRSA colonization surveillance program. It is not intended to diagnose MRSA infection nor to guide or monitor treatment for MRSA infections.     Coagulation Studies: Recent Labs    08/26/17 January 14, 2200  LABPROT 13.1  INR 1.00    Urinalysis: No results for input(s): COLORURINE, LABSPEC, PHURINE, GLUCOSEU, HGBUR, BILIRUBINUR, KETONESUR, PROTEINUR, UROBILINOGEN, NITRITE, LEUKOCYTESUR in the last 168  hours.  Invalid input(s): APPERANCEUR  Lipid Panel:  No results found for: CHOL, TRIG, HDL, CHOLHDL, VLDL, LDLCALC  HgbA1C:  Lab Results  Component Value Date   HGBA1C 8.4 (H) 08/26/2017    Urine Drug Screen:  No results found for: LABOPIA, COCAINSCRNUR, LABBENZ, AMPHETMU, THCU, LABBARB  Alcohol Level: No results for input(s): ETH in the last 168 hours.   Imaging: Dg Chest 1 View  Addendum Date: 08/27/2017   ADDENDUM REPORT: 08/27/2017 01:21 ADDENDUM: The patient was returned for additional axial CT images of the brain with coronal and sagittal multiplanar reformats. These additional images were added to the order for the current study. Intracranial contents demonstrate mild cerebral atrophy. Ventricular dilatation consistent with central atrophy. No mass effect or midline shift. No abnormal extra-axial fluid collections. Gray-white matter junctions are distinct. Basal cisterns are not effaced. No evidence of acute intracranial hemorrhage internal carotid artery and vertebrobasilar artery calcifications are present. Calvarium appears intact. Mucosal thickening in the paranasal sinuses likely inflammatory. No acute air-fluid levels. Mastoid air cells are not opacified. IMPRESSION: No  acute intracranial abnormalities. Chronic atrophy and small vessel ischemic changes. Electronically Signed   By: Lucienne Capers M.D.   On: 08/27/2017 01:21   Result Date: 08/27/2017 CLINICAL DATA:  Post intubation.  Seizure and fall. EXAM: CHEST 1 VIEW COMPARISON:  None. FINDINGS: An endotracheal tube has been placed with tip measuring 4.5 cm above the carina. An enteric tube was placed. The tip is off the field of view but below the left hemidiaphragm. Shallow inspiration with atelectasis in the lung bases. Heart size and pulmonary vascularity are normal for technique. No focal consolidation in the lungs. No blunting of costophrenic angles. No pneumothorax. IMPRESSION: Appliances appear in satisfactory  position. Shallow inspiration with atelectasis in the lung bases. Electronically Signed: By: Lucienne Capers M.D. On: 08/27/2017 00:27   Ct Head Wo Contrast  Addendum Date: 08/27/2017   ADDENDUM REPORT: 08/27/2017 02:04 ADDENDUM: The patient was returned for additional axial CT images of the brain with coronal and sagittal multiplanar reformatted images. These additional images were added to the order for the current study. Intracranial contents demonstrate mild cerebral atrophy. Ventricular dilatation consistent with central atrophy. No mass effect or midline shift. No abnormal extra-axial fluid collections. Gray-white matter junctions are distinct. Basal cisterns are not effaced. No evidence of acute intracranial hemorrhage internal carotid artery and vertebrobasilar artery calcifications are present. Calvarium appears intact. Mucosal thickening in the paranasal sinuses likely inflammatory. No acute air-fluid levels. Mastoid air cells are not opacified. IMPRESSION: No acute intracranial abnormalities. Chronic atrophy and small vessel ischemic changes. Electronically Signed   By: Lucienne Capers M.D.   On: 08/27/2017 02:04   Result Date: 08/27/2017 CLINICAL DATA:  Seizures. Patient was uncooperative despite sedation. History of diabetes. EXAM: CT HEAD WITHOUT CONTRAST TECHNIQUE: Contiguous axial images were obtained from the base of the skull through the vertex without intravenous contrast. COMPARISON:  None. FINDINGS: Brain: Examination is technically limited due to patient motion. There is severe limitation of visualization in some areas. As visualized, there is no gross evidence of acute intracranial hemorrhage or mass effect. There is bilateral cerebral atrophy with ventricular dilatation likely due to central atrophy. No abnormal extra-axial fluid collections are appreciated. Vascular: Vascular calcifications are present. Skull: Visualize calvarium appears intact. Sinuses/Orbits: Visualized paranasal  sinuses and mastoid air cells are clear. Other: None. IMPRESSION: Examination is limited due to motion artifact. No gross evidence of acute intracranial abnormality. Chronic atrophy. Electronically Signed: By: Lucienne Capers M.D. On: 08/26/2017 23:23     Assessment/Plan: 71 year old male presenting in status epilepticus.  Although able to be extubated today has not returned to baseline mental status.  Patient postures at times.  Unclear if there is continued seizure activity.  Keppra started today.  Head CT reviewed and movement artifact obscures viewing but there does not appear to be any significant abnormalities.  Further testing recommended.   TSH elevated.  A1c 8.4.    Recommendations: 1.  Serum prolactin, magnesium, phosphorus, hepatic function  2.  MRI of the brain with and without contrast 3.  EEG today.  May need transfer for continuous monitoring 4.  Seizure precautions  Alexis Goodell, MD Neurology [phone] 08/27/2017, 2:01 PM

## 2017-08-27 NOTE — ED Notes (Signed)
Pt taken back to CT for scan.

## 2017-08-27 NOTE — Progress Notes (Signed)
eLink Physician-Brief Progress Note Patient Name: Belen Zwahlen DOB: 1946-03-30 MRN: [id number]   Date of Service  08/27/2017  HPI/Events of Note  Blood glucose = 438.   eICU Interventions  Will start on an Novolog Insulin IV infusion.      Intervention Category Major Interventions: Hyperglycemia - active titration of insulin therapy  Lena Fieldhouse Cornelia Copa 08/27/2017, 5:47 AM

## 2017-08-27 NOTE — ED Notes (Addendum)
Pt taken to ct, unable to remain still for scan. Second dose of ativan ordered by MD

## 2017-08-27 NOTE — Progress Notes (Signed)
MEDICATION RELATED CONSULT NOTE - INITIAL   Pharmacy Consult for phenytoin  Indication: seizure activity  Allergies  Allergen Reactions  . Sulfa Antibiotics Hives    Patient Measurements: Height: 5\' 9"  (175.3 cm) Weight: 229 lb 15 oz (104.3 kg) IBW/kg (Calculated) : 70.7 Adjusted Body Weight: 106.5 kg  Vital Signs: Temp: 100.6 F (38.1 C) (11/20 0330) Temp Source: Axillary (11/20 0330) BP: 178/99 (11/20 0330) Pulse Rate: 130 (11/20 0330) Intake/Output from previous day: 11/19 0701 - 11/20 0700 In: 1250 [IV Piggyback:1250] Out: 1900 [Urine:1900] Intake/Output from this shift: Total I/O In: 1250 [IV Piggyback:1250] Out: 1900 [Urine:1900]  Labs: Recent Labs    08/26/17 2201  WBC 11.3*  HGB 16.2  HCT 48.8  PLT 212  APTT 26  CREATININE 1.34*   Estimated Creatinine Clearance: 60.1 mL/min (A) (by C-G formula based on SCr of 1.34 mg/dL (H)).   Microbiology: No results found for this or any previous visit (from the past 720 hour(s)).  Medical History: Past Medical History:  Diagnosis Date  . Brain tumor (benign) ([place])     Medications:  Scheduled:  . atorvastatin  80 mg Per Tube Daily  . bromocriptine  12.5 mg Per Tube Daily  . clopidogrel  75 mg Per Tube Daily  . docusate sodium  100 mg Oral BID  . heparin  5,000 Units Subcutaneous Q8H  . insulin aspart  0-5 Units Subcutaneous QHS  . insulin aspart  0-9 Units Subcutaneous TID WC  . lisinopril  5 mg Per Tube Daily    Assessment: Patient admitted for seizure activity after being found unresponsive in gas station bathroom. Per report patient has never had seizures. Patient was loaded w/ phenytoin 20 mg/kg in ED.  Goal of Therapy:  Resolution of seizure activity Phenytoin total goal: 10 - 20 mcg/mL Phenytoin free: 1 - 2 mcg/mL  Plan:  Will start patient on a maintenance dose of phenytoin 100 mg IV q8h via piggyback to avoid extravasation. Will draw phenytoin total and free prior to 4th maintenance dose  on 11/21 @ 0500. Will monitor levels and adjust as appropriate. No significant or contraindicating drug-drug interactions exist between phenytoin and patient's current meds.  Tobie Lords, PharmD, BCPS Clinical Pharmacist 08/27/2017

## 2017-08-27 NOTE — Progress Notes (Signed)
Inpatient Diabetes Program Recommendations  AACE/ADA: New Consensus Statement on Inpatient Glycemic Control (2015)  Target Ranges:  Prepandial:   less than 140 mg/dL      Peak postprandial:   less than 180 mg/dL (1-2 hours)      Critically ill patients:  140 - 180 mg/dL   Lab Results  Component Value Date   GLUCAP 469 (H) 08/27/2017    Review of Glycemic ControlResults for SONIA, STICKELS (MRN [id number]) as of 08/27/2017 09:50  Ref. Range 08/27/2017 03:28 08/27/2017 06:46 08/27/2017 07:20 08/27/2017 07:53 08/27/2017 09:11  Glucose-Capillary Latest Ref Range: 65 - 99 mg/dL 438 (H) 533 (HH) 523 (HH) 460 (H) 469 (H)   Diabetes history: Type 2 DM Outpatient Diabetes medications: Tradjenta 5 mg daily, Metformin 1000 mg bid Current orders for Inpatient glycemic control:  IV insulin-ICU order set  Inpatient Diabetes Program Recommendations:   Agree with current orders.  He likely will need basal/bolus insulin regimen when ready to transition off insulin drip.    Thanks, Adah Perl, RN, BC-ADM Inpatient Diabetes Coordinator Pager [phone] (8a-5p)

## 2017-08-27 NOTE — Progress Notes (Signed)
[place] at [facility]                                                                                                                                                                                  Patient Demographics   Rachid Parham, is a 71 y.o. male, DOB - 1945-12-31, NGE:[id number]  Admit date - 08/26/2017   Admitting Physician Harrie Foreman, MD  Outpatient Primary MD for the patient is System, Pcp Not In   LOS - 0  Subjective: Patient admitted with a seizure currently intubated No further seizures   Review of Systems:   CONSTITUTIONAL: Remains on the ventilator  Vitals:   Vitals:   08/27/17 1200 08/27/17 1215 08/27/17 1300 08/27/17 1400  BP: (!) 146/119 (!) 108/46 114/74 125/74  Pulse: 97 97 (!) 101 100  Resp: 15 14 12 18   Temp:      TempSrc:      SpO2: 99% 98% 98% 99%  Weight:      Height:        Wt Readings from Last 3 Encounters:  08/27/17 229 lb 15 oz (104.3 kg)     Intake/Output Summary (Last 24 hours) at 08/27/2017 1551 Last data filed at 08/27/2017 1400 Gross per 24 hour  Intake 2343.15 ml  Output 2345 ml  Net -1.85 ml    Physical Exam:   GENERAL: Patient intubated HEAD, EYES, EARS, NOSE AND THROAT: Atraumatic, normocephalic. . Sclerae anicteric. No conjunctival injection. No oro-pharyngeal erythema.  NECK: Supple. There is no jugular venous distention. No bruits, no lymphadenopathy, no thyromegaly.  HEART: Regular rate and rhythm,. No murmurs, no rubs, no clicks.  LUNGS: Clear to auscultation bilaterally. No rales or rhonchi. No wheezes.  ABDOMEN: Soft, flat, nontender, nondistended. Has good bowel sounds. No hepatosplenomegaly appreciated.  EXTREMITIES: No evidence of any cyanosis, clubbing, or peripheral edema.  +2 pedal and radial pulses bilaterally.  NEUROLOGIC: Intubated SKIN: Moist and warm with no rashes appreciated.  Psych: Intubated LN: No inguinal LN enlargement    Antibiotics   Anti-infectives  (From admission, onward)   None      Medications   Scheduled Meds: . [START ON 08/28/2017] atorvastatin  80 mg Oral Daily  . [START ON 08/28/2017] bromocriptine  12.5 mg Oral Daily  . chlorhexidine  15 mL Mouth Rinse BID  . [START ON 08/28/2017] clopidogrel  75 mg Oral Daily  . heparin  5,000 Units Subcutaneous Q8H  . mouth rinse  15 mL Mouth Rinse q12n4p   Continuous Infusions: . dexmedetomidine (PRECEDEX) IV infusion 1.2 mcg/kg/hr (08/27/17 1453)  . insulin (NOVOLIN-R) infusion 6.8 Units/hr (08/27/17 1520)  . lactated ringers 50 mL/hr at 08/27/17  1149  . levETIRAcetam Stopped (08/27/17 1203)   PRN Meds:.acetaminophen **OR** acetaminophen, LORazepam, [DISCONTINUED] ondansetron **OR** ondansetron (ZOFRAN) IV   Data Review:   Micro Results Recent Results (from the past 240 hour(s))  MRSA PCR Screening     Status: None   Collection Time: 08/27/17  3:24 AM  Result Value Ref Range Status   MRSA by PCR NEGATIVE NEGATIVE Final    Comment:        The GeneXpert MRSA Assay (FDA approved for NASAL specimens only), is one component of a comprehensive MRSA colonization surveillance program. It is not intended to diagnose MRSA infection nor to guide or monitor treatment for MRSA infections.     Radiology Reports Dg Chest 1 View  Addendum Date: 08/27/2017   ADDENDUM REPORT: 08/27/2017 01:21 ADDENDUM: The patient was returned for additional axial CT images of the brain with coronal and sagittal multiplanar reformats. These additional images were added to the order for the current study. Intracranial contents demonstrate mild cerebral atrophy. Ventricular dilatation consistent with central atrophy. No mass effect or midline shift. No abnormal extra-axial fluid collections. Gray-white matter junctions are distinct. Basal cisterns are not effaced. No evidence of acute intracranial hemorrhage internal carotid artery and vertebrobasilar artery calcifications are present. Calvarium  appears intact. Mucosal thickening in the paranasal sinuses likely inflammatory. No acute air-fluid levels. Mastoid air cells are not opacified. IMPRESSION: No acute intracranial abnormalities. Chronic atrophy and small vessel ischemic changes. Electronically Signed   By: Lucienne Capers M.D.   On: 08/27/2017 01:21   Result Date: 08/27/2017 CLINICAL DATA:  Post intubation.  Seizure and fall. EXAM: CHEST 1 VIEW COMPARISON:  None. FINDINGS: An endotracheal tube has been placed with tip measuring 4.5 cm above the carina. An enteric tube was placed. The tip is off the field of view but below the left hemidiaphragm. Shallow inspiration with atelectasis in the lung bases. Heart size and pulmonary vascularity are normal for technique. No focal consolidation in the lungs. No blunting of costophrenic angles. No pneumothorax. IMPRESSION: Appliances appear in satisfactory position. Shallow inspiration with atelectasis in the lung bases. Electronically Signed: By: Lucienne Capers M.D. On: 08/27/2017 00:27   Ct Head Wo Contrast  Addendum Date: 08/27/2017   ADDENDUM REPORT: 08/27/2017 02:04 ADDENDUM: The patient was returned for additional axial CT images of the brain with coronal and sagittal multiplanar reformatted images. These additional images were added to the order for the current study. Intracranial contents demonstrate mild cerebral atrophy. Ventricular dilatation consistent with central atrophy. No mass effect or midline shift. No abnormal extra-axial fluid collections. Gray-white matter junctions are distinct. Basal cisterns are not effaced. No evidence of acute intracranial hemorrhage internal carotid artery and vertebrobasilar artery calcifications are present. Calvarium appears intact. Mucosal thickening in the paranasal sinuses likely inflammatory. No acute air-fluid levels. Mastoid air cells are not opacified. IMPRESSION: No acute intracranial abnormalities. Chronic atrophy and small vessel ischemic  changes. Electronically Signed   By: Lucienne Capers M.D.   On: 08/27/2017 02:04   Result Date: 08/27/2017 CLINICAL DATA:  Seizures. Patient was uncooperative despite sedation. History of diabetes. EXAM: CT HEAD WITHOUT CONTRAST TECHNIQUE: Contiguous axial images were obtained from the base of the skull through the vertex without intravenous contrast. COMPARISON:  None. FINDINGS: Brain: Examination is technically limited due to patient motion. There is severe limitation of visualization in some areas. As visualized, there is no gross evidence of acute intracranial hemorrhage or mass effect. There is bilateral cerebral atrophy with ventricular dilatation likely due  to central atrophy. No abnormal extra-axial fluid collections are appreciated. Vascular: Vascular calcifications are present. Skull: Visualize calvarium appears intact. Sinuses/Orbits: Visualized paranasal sinuses and mastoid air cells are clear. Other: None. IMPRESSION: Examination is limited due to motion artifact. No gross evidence of acute intracranial abnormality. Chronic atrophy. Electronically Signed: By: Lucienne Capers M.D. On: 08/26/2017 23:23     CBC Recent Labs  Lab 08/26/17 2201  WBC 11.3*  HGB 16.2  HCT 48.8  PLT 212  MCV 89.8  MCH 29.8  MCHC 33.2  RDW 13.4  LYMPHSABS 3.4  MONOABS 0.8  EOSABS 0.2  BASOSABS 0.1    Chemistries  Recent Labs  Lab 08/26/17 2201 08/27/17 1022 08/27/17 1446  NA 138 135  --   K 3.8 3.4*  --   CL 102 104  --   CO2 16* 18*  --   GLUCOSE 445* 506*  --   BUN 17 19  --   CREATININE 1.34* 1.88*  --   CALCIUM 9.1 8.5*  --   MG  --   --  2.3  AST  --   --  27  ALT  --   --  25  ALKPHOS  --   --  71  BILITOT  --   --  1.2   ------------------------------------------------------------------------------------------------------------------ estimated creatinine clearance is 42.9 mL/min (A) (by C-G formula based on SCr of 1.88 mg/dL  (H)). ------------------------------------------------------------------------------------------------------------------ Recent Labs    08/26/17 2207  HGBA1C 8.4*   ------------------------------------------------------------------------------------------------------------------ No results for input(s): CHOL, HDL, LDLCALC, TRIG, CHOLHDL, LDLDIRECT in the last 72 hours. ------------------------------------------------------------------------------------------------------------------ Recent Labs    08/26/17 2207  TSH 6.838*   ------------------------------------------------------------------------------------------------------------------ No results for input(s): VITAMINB12, FOLATE, FERRITIN, TIBC, IRON, RETICCTPCT in the last 72 hours.  Coagulation profile Recent Labs  Lab 08/26/17 2201  INR 1.00    No results for input(s): DDIMER in the last 72 hours.  Cardiac Enzymes No results for input(s): CKMB, TROPONINI, MYOGLOBIN in the last 168 hours.  Invalid input(s): CK ------------------------------------------------------------------------------------------------------------------ Invalid input(s): [place]   This is a 71 year old male admitted for status epilepticus. 1.  Status epilepticus: New onset seizures.  Patient with history of macro prolactinemia, MRI of the brain EEG neurology consult pending, continue IV Keppra 2.  Acute respiratory failure: With hypoxemia; intubated for airway protection.    Possible extubation per the intensive 3.  Acute kidney injury: Hydrate with intravenous fluid.  Avoid nephrotoxic agents.  Repeat BMP in the morning 4.  Hypertension: Continue current medications 5.  Diabetes mellitus type 2: Hypoglycemia protocol while in the ICU 6.  Hyperlipidemia: Continue statin therapy 7.  Brain tumor: Plan per oncology.  Continue bromocriptine MRI of the brain 8.  DVT prophylaxis: Heparin 9.  GI prophylaxis: None until intubated 24  hours.         Code Status Orders  (From admission, onward)        Start     Ordered   08/27/17 0250  Full code  Continuous     08/27/17 0249    Code Status History    Date Active Date Inactive Code Status Order ID Comments User Context   This patient has a current code status but no historical code status.           Consults intensivist  DVT Prophylaxis  Lovenox   Lab Results  Component Value Date   PLT 212 08/26/2017     Time Spent in minutes  85min Greater  than 50% of time spent in care coordination and counseling patient regarding the condition and plan of care.   Dustin Flock M.D on 08/27/2017 at 3:51 PM  Between 7am to 6pm - Pager - [phone]  After 6pm go to [email or web address] - password [email or web address] [facility]   Office  [phone]

## 2017-08-27 NOTE — H&P (Signed)
Andrew Horton is an 71 y.o. male.   Chief Complaint: Seizure HPI: The patient with past medical history of brain tumor presents to the emergency department after a new onset seizure.  He was found bathroom of a gas station in a postictal state.  Upon arrival to the emergency department the patient was verbal but again seized.  He received Ativan but sees yet again.  CT of the head show no acute intracranial problem.  However, the patient continued to exhibit posturing and there was concern for airway protection which prompted the emergency department staff to intubate.  Hospitalist service was called for further management.  Past Medical History:  Diagnosis Date  . Brain tumor (benign) ([place])     No past surgical history on file.  History reviewed. No pertinent family history. Social History:  reports that  has never smoked. he has never used smokeless tobacco. He reports that he does not drink alcohol or use drugs.  Allergies:  Allergies  Allergen Reactions  . Sulfa Antibiotics Hives    Medications Prior to Admission  Medication Sig Dispense Refill  . atorvastatin (LIPITOR) 80 MG tablet Take 80 mg daily by mouth.    . bromocriptine (PARLODEL) 2.5 MG tablet Take 12.5 mg daily by mouth.    . clopidogrel (PLAVIX) 75 MG tablet Take 75 mg daily by mouth.    . linagliptin (TRADJENTA) 5 MG TABS tablet Take 5 mg daily by mouth.    Marland Kitchen lisinopril (PRINIVIL,ZESTRIL) 5 MG tablet Take 5 mg daily by mouth.    . metFORMIN (GLUCOPHAGE-XR) 500 MG 24 hr tablet Take 1,000 mg 2 (two) times daily by mouth.    . metoprolol succinate (TOPROL-XL) 25 MG 24 hr tablet Take 25 mg daily by mouth.    . nitroGLYCERIN (NITROSTAT) 0.4 MG SL tablet Place 0.4 mg every 5 (five) minutes as needed under the tongue for chest pain.      Results for orders placed or performed during the hospital encounter of 08/26/17 (from the past 48 hour(s))  Basic metabolic panel     Status: Abnormal   Collection Time: 08/26/17  10:01 PM  Result Value Ref Range   Sodium 138 135 - 145 mmol/L   Potassium 3.8 3.5 - 5.1 mmol/L   Chloride 102 101 - 111 mmol/L   CO2 16 (L) 22 - 32 mmol/L   Glucose, Bld 445 (H) 65 - 99 mg/dL   BUN 17 6 - 20 mg/dL   Creatinine, Ser 1.34 (H) 0.61 - 1.24 mg/dL   Calcium 9.1 8.9 - 10.3 mg/dL   GFR calc non Af Amer 52 (L) >60 mL/min   GFR calc Af Amer 60 (L) >60 mL/min    Comment: (NOTE) The eGFR has been calculated using the CKD EPI equation. This calculation has not been validated in all clinical situations. eGFR's persistently <60 mL/min signify possible Chronic Kidney Disease.    Anion gap 20 (H) 5 - 15  CBC with Differential     Status: Abnormal   Collection Time: 08/26/17 10:01 PM  Result Value Ref Range   WBC 11.3 (H) 3.8 - 10.6 K/uL   RBC 5.43 4.40 - 5.90 MIL/uL   Hemoglobin 16.2 13.0 - 18.0 g/dL   HCT 48.8 40.0 - 52.0 %   MCV 89.8 80.0 - 100.0 fL   MCH 29.8 26.0 - 34.0 pg   MCHC 33.2 32.0 - 36.0 g/dL   RDW 13.4 11.5 - 14.5 %   Platelets 212 150 - 440 K/uL  Neutrophils Relative % 60 %   Neutro Abs 6.8 (H) 1.4 - 6.5 K/uL   Lymphocytes Relative 30 %   Lymphs Abs 3.4 1.0 - 3.6 K/uL   Monocytes Relative 7 %   Monocytes Absolute 0.8 0.2 - 1.0 K/uL   Eosinophils Relative 2 %   Eosinophils Absolute 0.2 0 - 0.7 K/uL   Basophils Relative 1 %   Basophils Absolute 0.1 0 - 0.1 K/uL  Protime-INR     Status: None   Collection Time: 08/26/17 10:01 PM  Result Value Ref Range   Prothrombin Time 13.1 11.4 - 15.2 seconds   INR 1.00   APTT     Status: None   Collection Time: 08/26/17 10:01 PM  Result Value Ref Range   aPTT 26 24 - 36 seconds  TSH     Status: Abnormal   Collection Time: 08/26/17 10:07 PM  Result Value Ref Range   TSH 6.838 (H) 0.350 - 4.500 uIU/mL    Comment: Performed by a 3rd Generation assay with a functional sensitivity of <=0.01 uIU/mL.  Glucose, capillary     Status: Abnormal   Collection Time: 08/26/17 10:45 PM  Result Value Ref Range    Glucose-Capillary 297 (H) 65 - 99 mg/dL  Blood gas, arterial     Status: Abnormal   Collection Time: 08/27/17 12:01 AM  Result Value Ref Range   FIO2 0.50    Delivery systems VENTILATOR    Mode ASSIST CONTROL    VT 500 mL   LHR 14 resp/min   Peep/cpap 5.0 cm H20   pH, Arterial 7.33 (L) 7.350 - 7.450   pCO2 arterial 43 32.0 - 48.0 mmHg   pO2, Arterial 73 (L) 83.0 - 108.0 mmHg   Bicarbonate 22.7 20.0 - 28.0 mmol/L   Acid-base deficit 3.3 (H) 0.0 - 2.0 mmol/L   O2 Saturation 93.3 %   Patient temperature 37.0    Collection site RIGHT RADIAL    Sample type ARTERIAL DRAW    Allens test (pass/fail) PASS PASS  MRSA PCR Screening     Status: None   Collection Time: 08/27/17  3:24 AM  Result Value Ref Range   MRSA by PCR NEGATIVE NEGATIVE    Comment:        The GeneXpert MRSA Assay (FDA approved for NASAL specimens only), is one component of a comprehensive MRSA colonization surveillance program. It is not intended to diagnose MRSA infection nor to guide or monitor treatment for MRSA infections.   Glucose, capillary     Status: Abnormal   Collection Time: 08/27/17  3:28 AM  Result Value Ref Range   Glucose-Capillary 438 (H) 65 - 99 mg/dL   Dg Chest 1 View  Addendum Date: 08/27/2017   ADDENDUM REPORT: 08/27/2017 01:21 ADDENDUM: The patient was returned for additional axial CT images of the brain with coronal and sagittal multiplanar reformats. These additional images were added to the order for the current study. Intracranial contents demonstrate mild cerebral atrophy. Ventricular dilatation consistent with central atrophy. No mass effect or midline shift. No abnormal extra-axial fluid collections. Gray-white matter junctions are distinct. Basal cisterns are not effaced. No evidence of acute intracranial hemorrhage internal carotid artery and vertebrobasilar artery calcifications are present. Calvarium appears intact. Mucosal thickening in the paranasal sinuses likely inflammatory. No  acute air-fluid levels. Mastoid air cells are not opacified. IMPRESSION: No acute intracranial abnormalities. Chronic atrophy and small vessel ischemic changes. Electronically Signed   By: Lucienne Capers M.D.   On: 08/27/2017  01:21   Result Date: 08/27/2017 CLINICAL DATA:  Post intubation.  Seizure and fall. EXAM: CHEST 1 VIEW COMPARISON:  None. FINDINGS: An endotracheal tube has been placed with tip measuring 4.5 cm above the carina. An enteric tube was placed. The tip is off the field of view but below the left hemidiaphragm. Shallow inspiration with atelectasis in the lung bases. Heart size and pulmonary vascularity are normal for technique. No focal consolidation in the lungs. No blunting of costophrenic angles. No pneumothorax. IMPRESSION: Appliances appear in satisfactory position. Shallow inspiration with atelectasis in the lung bases. Electronically Signed: By: Lucienne Capers M.D. On: 08/27/2017 00:27   Ct Head Wo Contrast  Addendum Date: 08/27/2017   ADDENDUM REPORT: 08/27/2017 02:04 ADDENDUM: The patient was returned for additional axial CT images of the brain with coronal and sagittal multiplanar reformatted images. These additional images were added to the order for the current study. Intracranial contents demonstrate mild cerebral atrophy. Ventricular dilatation consistent with central atrophy. No mass effect or midline shift. No abnormal extra-axial fluid collections. Gray-white matter junctions are distinct. Basal cisterns are not effaced. No evidence of acute intracranial hemorrhage internal carotid artery and vertebrobasilar artery calcifications are present. Calvarium appears intact. Mucosal thickening in the paranasal sinuses likely inflammatory. No acute air-fluid levels. Mastoid air cells are not opacified. IMPRESSION: No acute intracranial abnormalities. Chronic atrophy and small vessel ischemic changes. Electronically Signed   By: Lucienne Capers M.D.   On: 08/27/2017 02:04    Result Date: 08/27/2017 CLINICAL DATA:  Seizures. Patient was uncooperative despite sedation. History of diabetes. EXAM: CT HEAD WITHOUT CONTRAST TECHNIQUE: Contiguous axial images were obtained from the base of the skull through the vertex without intravenous contrast. COMPARISON:  None. FINDINGS: Brain: Examination is technically limited due to patient motion. There is severe limitation of visualization in some areas. As visualized, there is no gross evidence of acute intracranial hemorrhage or mass effect. There is bilateral cerebral atrophy with ventricular dilatation likely due to central atrophy. No abnormal extra-axial fluid collections are appreciated. Vascular: Vascular calcifications are present. Skull: Visualize calvarium appears intact. Sinuses/Orbits: Visualized paranasal sinuses and mastoid air cells are clear. Other: None. IMPRESSION: Examination is limited due to motion artifact. No gross evidence of acute intracranial abnormality. Chronic atrophy. Electronically Signed: By: Lucienne Capers M.D. On: 08/26/2017 23:23    Review of Systems  Unable to perform ROS: Critical illness    Blood pressure (!) 150/73, pulse (!) 110, temperature (!) 100.6 F (38.1 C), temperature source Axillary, resp. rate 17, height _0  (1.753 m), weight 104.3 kg (229 lb 15 oz), SpO2 99 %. Physical Exam  Vitals reviewed. Constitutional: He appears well-developed and well-nourished. No distress.  HENT:  Head: Normocephalic and atraumatic.  Mouth/Throat: Oropharynx is clear and moist.  Eyes: Conjunctivae and EOM are normal. Pupils are equal, round, and reactive to light. No scleral icterus.  Neck: Normal range of motion. Neck supple. No JVD present. No tracheal deviation present. No thyromegaly present.  Cardiovascular: Normal rate, regular rhythm and normal heart sounds. Exam reveals no gallop and no friction rub.  No murmur heard. Respiratory: Breath sounds normal.  Intubated on mechanical  ventilation  GI: Soft. Bowel sounds are normal. He exhibits no distension. There is no tenderness.  Genitourinary:  Genitourinary Comments: Deferred  Musculoskeletal: Normal range of motion. He exhibits no edema.  Lymphadenopathy:    He has no cervical adenopathy.  Neurological:  Sedated and intubated  Skin: Skin is warm and dry. No rash noted.  No erythema.     Assessment/Plan This is a 71 year old male admitted for status epilepticus. 1.  Status epilepticus: New onset seizures.  Secondary to brain tumor.  Continue antiepileptic medications. 2.  Acute respiratory failure: With hypoxemia; intubated for airway protection.  Continue sedation and pain management while intubated. 3.  Acute kidney injury: Hydrate with intravenous fluid.  Avoid nephrotoxic agents. 4.  Hypertension: Acceptable for age, continue hydralazine, lisinopril and metoprolol/labetalol if pressure allows. 5.  Diabetes mellitus type 2: Hold oral hypoglycemic agents.  Sliding scale insulin while hospitalized.  May continue Tradjenta.  6.  Hyperlipidemia: Continue statin therapy 7.  Brain tumor: Plan per oncology.  Continue bromocriptine 8.  DVT prophylaxis: Heparin 9.  GI prophylaxis: None until intubated 24 hours. Patient is a full code.  Time spent on admission orders and critical care proximally 45 minutes.  Discussed with [facility] telemedicine  Harrie Foreman, MD 08/27/2017, 6:53 AM

## 2017-08-27 NOTE — Progress Notes (Signed)
eLink Physician-Brief Progress Note Patient Name: Andrew Horton DOB: 17-Aug-1946 MRN: [id number]   Date of Service  08/27/2017  HPI/Events of Note  71 yo male with PMH of Brain  Tumor and medication non compliance admitted to ICU mechanically intubated for airway protection due to seizure activity. VSS. PCCM managing the patient in ICU.    eICU Interventions  No new orders.     Intervention Category Evaluation Type: New Patient Evaluation  Lysle Dingwall 08/27/2017, 3:55 AM

## 2017-08-27 NOTE — ED Notes (Signed)
Andrew Horton  Closest Contact [phone]  Car keys taken by Andrew Horton to move car, to prevent car from being towed

## 2017-08-27 NOTE — ED Notes (Signed)
MD ordered vecuronium 10 mg and charge nurse brought medication to CT for administration.

## 2017-08-27 NOTE — Progress Notes (Signed)
Patient is placed on high fowlers position, cuff deflated, suctioned orally and endotracheally and then extubated to 2 lpm O2 

## 2017-08-27 NOTE — Consult Note (Signed)
PULMONARY / CRITICAL CARE MEDICINE   Name: Kary Colaizzi MRN: [id number] DOB: 10-Jul-1946    ADMISSION DATE:  08/26/2017 CONSULTATION DATE: 08/26/2017  REFERRING MD: Dr. Marcille Blanco   CHIEF COMPLAINT: Seizure Activity   HISTORY OF PRESENT ILLNESS:   This is a 71 yo male with a PMH of Brain Tumor and Medication Noncompliance.  He presented to [facility] ER 11/19 after a bystander at a gas station restroom heard the pt fall and witnessed the pt having seizure like activity EMS notified.  Upon EMS arrival the pt was postictal and CBG 384.  In the ER pts mentation improved and he was able to answer questions initially.  However, the pt had another seizure in the ER, during seizure he bit his tongue and had urinary incontinence.  He received 2 mg iv ativan and O2 sats dropped to 83%, therefore due to inability to protect his airway he required mechanical intubation.  He also received phenytoin and CT head negative for acute intracranial abnormality.  He was subsequently admitted to ICU for further workup and treatment PCCM consulted for vent management.  PAST MEDICAL HISTORY :  He  has a past medical history of Brain tumor (benign) ([place]).  PAST SURGICAL HISTORY: He  has no past surgical history on file.  Allergies  Allergen Reactions  . Sulfa Antibiotics Hives    No current facility-administered medications on file prior to encounter.    Current Outpatient Medications on File Prior to Encounter  Medication Sig  . atorvastatin (LIPITOR) 80 MG tablet Take 80 mg daily by mouth.  . bromocriptine (PARLODEL) 2.5 MG tablet Take 12.5 mg daily by mouth.  . clopidogrel (PLAVIX) 75 MG tablet Take 75 mg daily by mouth.  . linagliptin (TRADJENTA) 5 MG TABS tablet Take 5 mg daily by mouth.  Marland Kitchen lisinopril (PRINIVIL,ZESTRIL) 5 MG tablet Take 5 mg daily by mouth.  . metFORMIN (GLUCOPHAGE-XR) 500 MG 24 hr tablet Take 1,000 mg 2 (two) times daily by mouth.  . metoprolol succinate (TOPROL-XL) 25 MG 24 hr tablet  Take 25 mg daily by mouth.  . nitroGLYCERIN (NITROSTAT) 0.4 MG SL tablet Place 0.4 mg every 5 (five) minutes as needed under the tongue for chest pain.    FAMILY HISTORY:  His has no family status information on file.    SOCIAL HISTORY: He  reports that  has never smoked. he has never used smokeless tobacco. He reports that he does not drink alcohol or use drugs.  REVIEW OF SYSTEMS:   Pt mechanically intubated  SUBJECTIVE:  Pt mechanically intubated   VITAL SIGNS: BP (!) 206/107   Pulse (!) 125   Resp (!) 25   Ht _0  (1.753 m)   Wt 106.6 kg (235 lb)   SpO2 98%   BMI 34.70 kg/m   HEMODYNAMICS:    VENTILATOR SETTINGS: Vent Mode: AC FiO2 (%):  [50 %] 50 % Set Rate:  [14 bmp-16 bmp] 16 bmp Vt Set:  [500 mL] 500 mL PEEP:  [5 cmH20] 5 cmH20  INTAKE / OUTPUT: No intake/output data recorded.  PHYSICAL EXAMINATION: General: mechanically intubated male, NAD  Neuro: sedated not following commands, PERRL HEENT: supple, no JVD  Cardiovascular: sinus tach, s1s2, no M/R/G Lungs: faint rhonchi throughout, even, non labored  Abdomen: +BS x4, obese, soft, non distended  Musculoskeletal: normal bulk and tone, no edema  Skin: intact no rashes or lesions   LABS:  BMET Recent Labs  Lab 08/26/17 2201  NA 138  K 3.8  CL 102  CO2 16*  BUN 17  CREATININE 1.34*  GLUCOSE 445*    Electrolytes Recent Labs  Lab 08/26/17 2201  CALCIUM 9.1    CBC Recent Labs  Lab 08/26/17 2201  WBC 11.3*  HGB 16.2  HCT 48.8  PLT 212    Coag's Recent Labs  Lab 08/26/17 2201  APTT 26  INR 1.00    Sepsis Markers No results for input(s): LATICACIDVEN, PROCALCITON, O2SATVEN in the last 168 hours.  ABG Recent Labs  Lab 08/27/17 0001  PHART 7.33*  PCO2ART 43  PO2ART 73*    Liver Enzymes No results for input(s): AST, ALT, ALKPHOS, BILITOT, ALBUMIN in the last 168 hours.  Cardiac Enzymes No results for input(s): TROPONINI, PROBNP in the last 168  hours.  Glucose Recent Labs  Lab 08/26/17 2245  GLUCAP 297*    Imaging Dg Chest 1 View  Result Date: 08/27/2017 CLINICAL DATA:  Post intubation.  Seizure and fall. EXAM: CHEST 1 VIEW COMPARISON:  None. FINDINGS: An endotracheal tube has been placed with tip measuring 4.5 cm above the carina. An enteric tube was placed. The tip is off the field of view but below the left hemidiaphragm. Shallow inspiration with atelectasis in the lung bases. Heart size and pulmonary vascularity are normal for technique. No focal consolidation in the lungs. No blunting of costophrenic angles. No pneumothorax. IMPRESSION: Appliances appear in satisfactory position. Shallow inspiration with atelectasis in the lung bases. Electronically Signed   By: Lucienne Capers M.D.   On: 08/27/2017 00:27   Ct Head Wo Contrast  Result Date: 08/26/2017 CLINICAL DATA:  Seizures. Patient was uncooperative despite sedation. History of diabetes. EXAM: CT HEAD WITHOUT CONTRAST TECHNIQUE: Contiguous axial images were obtained from the base of the skull through the vertex without intravenous contrast. COMPARISON:  None. FINDINGS: Brain: Examination is technically limited due to patient motion. There is severe limitation of visualization in some areas. As visualized, there is no gross evidence of acute intracranial hemorrhage or mass effect. There is bilateral cerebral atrophy with ventricular dilatation likely due to central atrophy. No abnormal extra-axial fluid collections are appreciated. Vascular: Vascular calcifications are present. Skull: Visualize calvarium appears intact. Sinuses/Orbits: Visualized paranasal sinuses and mastoid air cells are clear. Other: None. IMPRESSION: Examination is limited due to motion artifact. No gross evidence of acute intracranial abnormality. Chronic atrophy. Electronically Signed   By: Lucienne Capers M.D.   On: 08/26/2017 23:23   SIGNIFICANT EVENTS/TEST RESULTS:  11/20-Pt admitted to ICU  mechanically intubated for airway protection due to seizure activity  CT Head 11/19-Examination is limited due to motion artifact. No gross evidence of acute intracranial abnormality. Chronic atrophy.  CULTURES: None   ANTIBIOTICS: None   LINES/TUBES: ETT 11/20>>  ASSESSMENT / PLAN:  PULMONARY A: Mechanical Intubation for Airway Protection  P:   Vent settings established VAP bundle implemented  SBT once all parameters met   CARDIOVASCULAR A:  Hypertension Tachycardia  P:  Continuous telemetry monitoring  Continue outpatient cardiac medications  Prn iv hydralazine for bp management Prn metoprolol for hr >120  RENAL A:   Acute renal failure P:   Trend BMP Replace electrolytes as indicated  Monitor UOP  GASTROINTESTINAL A:   No acute issues  P:   Keep NPO for now  SUP: IV Pepcid   HEMATOLOGIC A:   No acute issues  P:  Subq heparin for VTE prophylaxis Trend CBC Monitor for s/sx of bleeding Transfuse per usual guidelines   INFECTIOUS A:  Mild leukocytosis no indication of infection P:   Trend WBC and monitor fever curve   ENDOCRINE A:   Hyperglycemia P:   CBG's q4hrs  SSI   NEUROLOGIC A:   Seizure Activity  P:   RASS goal: 0 to -1  Propofol to maintain RASS goal  Prn ativan for seizure activity Continue dilantin  EEG pending Neurology consulted appreciate input  WUA daily   FAMILY  - Updates: No family at bedside to update 08/27/17  Marda Stalker, Connell Pager [phone] (please enter 7 digits) PCCM Consult Pager [phone] (please enter 7 digits)   PCCM ATTENDING ATTESTATION:  I have evaluated patient with the APP [place], reviewed database in its entirety and discussed care plan in detail. In addition, this patient was discussed on multidisciplinary rounds.   This morning, he passed an SBT.  However, he remained encephalopathic and was not following commands consistently.  He had spontaneous cough  on suctioning.  Therefore he was extubated and has tolerated this well.  Off of all sedatives (propofol) he remains encephalopathic with periods of agitation.  He is unable to follow commands and cooperate.  Precedex infusion was initiated to allow for EEG performance.  Important exam findings: Encephalopathic No respiratory distress Moves all extremities vigorously Chest clear anteriorly Regular, no M NABS, soft Extremities warm, no edema  CXR: Low lung volumes, no acute findings CT head: No acute intracranial pathology noted  Major problems addressed by PCCM team: History of prolactinoma Apparently new onset seizure Intubated for airway protection -now extubated and tolerating Acute encephalopathy  - likely post ictal state   PLAN/REC: Keppra ordered EEG today Neurology consultation Continue Precedex infusion for agitation MRI brain ordered Maintenance fluids ordered Monitor BMET intermittently Monitor I/Os Correct electrolytes as indicated  Monitor and ICU/SDU post extubation    Merton Border, MD PCCM service Mobile [phone] Pager [phone] 08/27/2017 2:37 PM

## 2017-08-27 NOTE — Progress Notes (Signed)
Pt. Transported to CT on vent.

## 2017-08-28 ENCOUNTER — Inpatient Hospital Stay: Payer: Medicare Other

## 2017-08-28 DIAGNOSIS — J9600 Acute respiratory failure, unspecified whether with hypoxia or hypercapnia: Secondary | ICD-10-CM

## 2017-08-28 LAB — BLOOD GAS, ARTERIAL
Acid-base deficit: 3.9 mmol/L — ABNORMAL HIGH (ref 0.0–2.0)
Bicarbonate: 22.2 mmol/L (ref 20.0–28.0)
FIO2: 0.7
LHR: 18 {breaths}/min
MECHVT: 550 mL
O2 SAT: 99.2 %
PATIENT TEMPERATURE: 37
PCO2 ART: 43 mmHg (ref 32.0–48.0)
PEEP: 5 cmH2O
PO2 ART: 151 mmHg — AB (ref 83.0–108.0)
pH, Arterial: 7.32 — ABNORMAL LOW (ref 7.350–7.450)

## 2017-08-28 LAB — TROPONIN I: TROPONIN I: 0.11 ng/mL — AB (ref <0.03)

## 2017-08-28 LAB — BASIC METABOLIC PANEL
Anion gap: 11 (ref 5–15)
BUN: 16 mg/dL (ref 6–20)
CHLORIDE: 108 mmol/L (ref 101–111)
CO2: 23 mmol/L (ref 22–32)
Calcium: 8.4 mg/dL — ABNORMAL LOW (ref 8.9–10.3)
Creatinine, Ser: 1.3 mg/dL — ABNORMAL HIGH (ref 0.61–1.24)
GFR calc Af Amer: >60 mL/min (ref >60)
GFR calc non Af Amer: 54 mL/min — ABNORMAL LOW (ref >60)
GLUCOSE: 328 mg/dL — AB (ref 65–99)
Potassium: 3.4 mmol/L — ABNORMAL LOW (ref 3.5–5.1)
Sodium: 142 mmol/L (ref 135–145)

## 2017-08-28 LAB — MAGNESIUM: Magnesium: 2.3 mg/dL (ref 1.7–2.4)

## 2017-08-28 LAB — CBC WITH DIFFERENTIAL/PLATELET
Basophils Absolute: 0.1 10*3/uL (ref 0–0.1)
Basophils Relative: 1 %
EOS PCT: 0 %
Eosinophils Absolute: 0 10*3/uL (ref 0–0.7)
HCT: 49.3 % (ref 40.0–52.0)
Hemoglobin: 16.2 g/dL (ref 13.0–18.0)
LYMPHS ABS: 1.3 10*3/uL (ref 1.0–3.6)
LYMPHS PCT: 8 %
MCH: 29 pg (ref 26.0–34.0)
MCHC: 32.9 g/dL (ref 32.0–36.0)
MCV: 88.2 fL (ref 80.0–100.0)
MONO ABS: 0.8 10*3/uL (ref 0.2–1.0)
MONOS PCT: 5 %
Neutro Abs: 13.5 10*3/uL — ABNORMAL HIGH (ref 1.4–6.5)
Neutrophils Relative %: 86 %
PLATELETS: 191 10*3/uL (ref 150–440)
RBC: 5.59 MIL/uL (ref 4.40–5.90)
RDW: 13.5 % (ref 11.5–14.5)
WBC: 15.7 10*3/uL — AB (ref 3.8–10.6)

## 2017-08-28 LAB — TRIGLYCERIDES: Triglycerides: 205 mg/dL — ABNORMAL HIGH (ref <150)

## 2017-08-28 LAB — GLUCOSE, CAPILLARY
Glucose-Capillary: 164 mg/dL — ABNORMAL HIGH (ref 65–99)
Glucose-Capillary: 200 mg/dL — ABNORMAL HIGH (ref 65–99)
Glucose-Capillary: 203 mg/dL — ABNORMAL HIGH (ref 65–99)
Glucose-Capillary: 218 mg/dL — ABNORMAL HIGH (ref 65–99)
Glucose-Capillary: 251 mg/dL — ABNORMAL HIGH (ref 65–99)
Glucose-Capillary: 259 mg/dL — ABNORMAL HIGH (ref 65–99)
Glucose-Capillary: 262 mg/dL — ABNORMAL HIGH (ref 65–99)
Glucose-Capillary: 269 mg/dL — ABNORMAL HIGH (ref 65–99)
Glucose-Capillary: 270 mg/dL — ABNORMAL HIGH (ref 65–99)
Glucose-Capillary: 271 mg/dL — ABNORMAL HIGH (ref 65–99)
Glucose-Capillary: 275 mg/dL — ABNORMAL HIGH (ref 65–99)
Glucose-Capillary: 288 mg/dL — ABNORMAL HIGH (ref 65–99)
Glucose-Capillary: 313 mg/dL — ABNORMAL HIGH (ref 65–99)

## 2017-08-28 LAB — PROCALCITONIN: Procalcitonin: 0.13 ng/mL

## 2017-08-28 LAB — PROLACTIN: Prolactin: 112.8 ng/mL — ABNORMAL HIGH (ref 4.0–15.2)

## 2017-08-28 LAB — RPR: RPR Ser Ql: NONREACTIVE

## 2017-08-28 MED ORDER — METOPROLOL TARTRATE 5 MG/5ML IV SOLN: 2.5000 mg | INTRAVENOUS | Status: DC | PRN

## 2017-08-28 MED ORDER — POTASSIUM CHLORIDE 10 MEQ/100ML IV SOLN
10.0000 meq | INTRAVENOUS | Status: AC
  Administered 2017-08-28 (×3): 10 meq via INTRAVENOUS
  Filled 2017-08-28 (×3): qty 100

## 2017-08-28 MED ORDER — ROCURONIUM BROMIDE 50 MG/5ML IV SOLN
INTRAVENOUS | Status: AC
  Administered 2017-08-28: 50 mg
  Filled 2017-08-28: qty 1

## 2017-08-28 MED ORDER — INSULIN GLARGINE 100 UNIT/ML ~~LOC~~ SOLN
20.0000 [IU] | Freq: Every day | SUBCUTANEOUS | Status: DC
  Administered 2017-08-28: 20 [IU] via SUBCUTANEOUS
  Filled 2017-08-28 (×2): qty 0.2

## 2017-08-28 MED ORDER — FAMOTIDINE IN NACL 20-0.9 MG/50ML-% IV SOLN
20.0000 mg | INTRAVENOUS | Status: DC
  Administered 2017-08-28: 20 mg via INTRAVENOUS
  Filled 2017-08-28: qty 50

## 2017-08-28 MED ORDER — ETOMIDATE 2 MG/ML IV SOLN
INTRAVENOUS | Status: AC
  Administered 2017-08-28: 20 mg
  Filled 2017-08-28: qty 10

## 2017-08-28 MED ORDER — PROPOFOL 1000 MG/100ML IV EMUL
0.0000 ug/kg/min | INTRAVENOUS | Status: DC
  Administered 2017-08-28: 35 ug/kg/min via INTRAVENOUS
  Administered 2017-08-28: 15 ug/kg/min via INTRAVENOUS
  Administered 2017-08-28: 30 ug/kg/min via INTRAVENOUS
  Administered 2017-08-29 (×3): 35 ug/kg/min via INTRAVENOUS
  Administered 2017-08-30: 30 ug/kg/min via INTRAVENOUS
  Filled 2017-08-28 (×7): qty 100

## 2017-08-28 MED ORDER — FENTANYL CITRATE (PF) 100 MCG/2ML IJ SOLN
25.0000 ug | INTRAMUSCULAR | Status: DC | PRN
  Administered 2017-08-29 (×2): 50 ug via INTRAVENOUS
  Filled 2017-08-28 (×3): qty 2

## 2017-08-28 MED ORDER — PRO-STAT SUGAR FREE PO LIQD: 30.0000 mL | Freq: Two times a day (BID) | ORAL | Status: DC

## 2017-08-28 MED ORDER — FAMOTIDINE IN NACL 20-0.9 MG/50ML-% IV SOLN
20.0000 mg | Freq: Two times a day (BID) | INTRAVENOUS | Status: DC
  Administered 2017-08-28 – 2017-08-29 (×3): 20 mg via INTRAVENOUS
  Filled 2017-08-28 (×3): qty 50

## 2017-08-28 MED ORDER — NOREPINEPHRINE BITARTRATE 1 MG/ML IV SOLN
0.0000 ug/min | INTRAVENOUS | Status: DC
  Administered 2017-08-28: 10 ug/min via INTRAVENOUS
  Filled 2017-08-28 (×2): qty 16

## 2017-08-28 MED ORDER — GADOBENATE DIMEGLUMINE 529 MG/ML IV SOLN
20.0000 mL | Freq: Once | INTRAVENOUS | Status: AC | PRN
  Administered 2017-08-28: 20 mL via INTRAVENOUS

## 2017-08-28 MED ORDER — SODIUM CHLORIDE 0.9 % IV BOLUS (SEPSIS)
500.0000 mL | Freq: Once | INTRAVENOUS | Status: AC
  Administered 2017-08-28: 500 mL via INTRAVENOUS

## 2017-08-28 MED ORDER — BROMOCRIPTINE MESYLATE 2.5 MG PO TABS
12.5000 mg | ORAL_TABLET | Freq: Every day | ORAL | Status: DC
  Filled 2017-08-28 (×7): qty 5

## 2017-08-28 MED ORDER — CHLORHEXIDINE GLUCONATE 0.12% ORAL RINSE (MEDLINE KIT)
15.0000 mL | Freq: Two times a day (BID) | OROMUCOSAL | Status: DC
  Administered 2017-08-28 – 2017-09-01 (×9): 15 mL via OROMUCOSAL

## 2017-08-28 MED ORDER — FREE WATER: 200.0000 mL | Freq: Three times a day (TID) | Status: DC

## 2017-08-28 MED ORDER — ENOXAPARIN SODIUM 40 MG/0.4ML ~~LOC~~ SOLN
40.0000 mg | SUBCUTANEOUS | Status: DC
  Administered 2017-08-28 – 2017-08-30 (×3): 40 mg via SUBCUTANEOUS
  Filled 2017-08-28 (×3): qty 0.4

## 2017-08-28 MED ORDER — SODIUM CHLORIDE 0.9 % IV SOLN
INTRAVENOUS | Status: DC
  Administered 2017-08-28: 2.2 [IU]/h via INTRAVENOUS
  Administered 2017-08-29: 5.2 [IU]/h via INTRAVENOUS
  Filled 2017-08-28 (×2): qty 1

## 2017-08-28 MED ORDER — PROPOFOL 1000 MG/100ML IV EMUL
0.0000 ug/kg/min | INTRAVENOUS | Status: DC
  Administered 2017-08-28: 25 ug/kg/min via INTRAVENOUS
  Administered 2017-08-28: 10 ug/kg/min via INTRAVENOUS
  Filled 2017-08-28 (×2): qty 100

## 2017-08-28 MED ORDER — HYDRALAZINE HCL 20 MG/ML IJ SOLN
10.0000 mg | INTRAMUSCULAR | Status: DC | PRN
  Administered 2017-08-28: 10 mg via INTRAVENOUS
  Administered 2017-08-31: 5 mg via INTRAVENOUS
  Administered 2017-08-31: 10 mg via INTRAVENOUS
  Filled 2017-08-28 (×3): qty 1

## 2017-08-28 MED ORDER — VITAL HIGH PROTEIN PO LIQD: 1000.0000 mL | ORAL | Status: DC

## 2017-08-28 MED ORDER — INSULIN ASPART 100 UNIT/ML ~~LOC~~ SOLN
0.0000 [IU] | SUBCUTANEOUS | Status: DC
  Administered 2017-08-28 (×2): 8 [IU] via SUBCUTANEOUS
  Administered 2017-08-28: 11 [IU] via SUBCUTANEOUS
  Administered 2017-08-28: 8 [IU] via SUBCUTANEOUS
  Filled 2017-08-28 (×4): qty 1

## 2017-08-28 MED ORDER — DEXTROSE 5 % IV SOLN
10.0000 mg/kg | Freq: Three times a day (TID) | INTRAVENOUS | Status: DC
  Administered 2017-08-28 – 2017-09-03 (×19): 840 mg via INTRAVENOUS
  Filled 2017-08-28 (×22): qty 16.8

## 2017-08-28 MED ORDER — ORAL CARE MOUTH RINSE
15.0000 mL | OROMUCOSAL | Status: DC
  Administered 2017-08-28 – 2017-09-01 (×37): 15 mL via OROMUCOSAL

## 2017-08-28 MED ORDER — ASPIRIN 81 MG PO CHEW
81.0000 mg | CHEWABLE_TABLET | Freq: Every day | ORAL | Status: DC
  Administered 2017-09-04: 81 mg
  Filled 2017-08-28 (×2): qty 1

## 2017-08-28 MED ORDER — METOPROLOL TARTRATE 5 MG/5ML IV SOLN: 5.0000 mg | INTRAVENOUS | Status: DC | PRN

## 2017-08-28 MED ORDER — FENTANYL CITRATE (PF) 100 MCG/2ML IJ SOLN: 25.0000 ug | INTRAMUSCULAR | Status: DC | PRN

## 2017-08-28 MED FILL — Medication: Qty: 1 | Status: AC

## 2017-08-28 NOTE — Progress Notes (Signed)
[place] for electrolytes   Labs: Sodium (mmol/L)  Date Value  08/28/2017 142   Potassium (mmol/L)  Date Value  08/28/2017 3.4 (L)   Magnesium (mg/dL)  Date Value  08/28/2017 2.3   Phosphorus (mg/dL)  Date Value  08/27/2017 2.8   Calcium (mg/dL)  Date Value  08/28/2017 8.4 (L)   Albumin (g/dL)  Date Value  08/27/2017 3.7    Estimated Creatinine Clearance: 62 mL/min (A) (by C-G formula based on SCr of 1.3 mg/dL (H)).  Assessment: 71 yo male admitted with seizure activity. Pharmacy has been consulted to manage electrolytes.   Goal of Therapy:  K = 3.5 - 5  Plan:  K = 3.4. Will replace with potassium IV 48mEq x3 doses. Will follow with AM labs. Pharmacy will continue to monitor and adjust as needed.   Lendon Ka, PharmD Pharmacy Resident 08/28/2017,5:38 PM

## 2017-08-28 NOTE — Progress Notes (Signed)
[facility] Progress Note Patient Name: Andrew Horton DOB: December 01, 1945 MRN: [id number]   Date of Service  08/28/2017  HPI/Events of Note  Pt developed respiratory distress, extubated yesterday AM.   eICU Interventions  Pt reintubated by NP.  Will check CXR.         Iseah Plouff 08/28/2017, 1:50 AM

## 2017-08-28 NOTE — Progress Notes (Signed)
Inpatient Diabetes Program Recommendations  AACE/ADA: New Consensus Statement on Inpatient Glycemic Control (2015)  Target Ranges:  Prepandial:   less than 140 mg/dL      Peak postprandial:   less than 180 mg/dL (1-2 hours)      Critically ill patients:  140 - 180 mg/dL   Lab Results  Component Value Date   GLUCAP 270 (H) 08/28/2017   HGBA1C 8.4 (H) 08/26/2017    Review of Glycemic ControlResults for JANI, PLOEGER (MRN [id number]) as of 08/28/2017 13:32  Ref. Range 08/28/2017 01:36 08/28/2017 03:30 08/28/2017 07:25 08/28/2017 11:35 08/28/2017 13:13  Glucose-Capillary Latest Ref Range: 65 - 99 mg/dL 218 (H) 313 (H) 259 (H) 288 (H) 270 (H)  Diabetes history: Type 2 DM Outpatient Diabetes medications: Tradjenta 5 mg daily, Metformin 1000 mg bid Current orders for Inpatient glycemic control:  Lantus 20 units daily, Novolog moderate q 4 hours  Inpatient Diabetes Program Recommendations:   Note that CBG's increased after transition off insulin drip.  Patient now re-intubated.  Consider resumption of ICU glycemic control order set- IV insulin.   Thanks,  Adah Perl, RN, BC-ADM Inpatient Diabetes Coordinator Pager [phone] (8a-5p)

## 2017-08-28 NOTE — Procedures (Signed)
Intubation Procedure Note Andrew Horton [id number] [birth year]  Procedure: Intubation Indications: Respiratory insufficiency  Procedure Details Consent: Unable to obtain consent because of emergent medical necessity. Time Out: Verified patient identification, verified procedure, site/side was marked, verified correct patient position, special equipment/implants available, medications/allergies/relevent history reviewed, required imaging and test results available.  Performed  20mg  Etomidate, 50 mg Rocuronium. DL x 7.5with # *3 blade. Grade 2 view. ET tube visualized passing through vocal cords. Following intubation:  positive color change on ETCO2, condensation seen in endotracheal tube, equal breath sounds bilaterally.  Evaluation Hemodynamic Status: BP stable throughout; O2 sats: transiently fell during during procedure Patient's Current Condition: stable Complications: No apparent complications Patient did tolerate procedure well. Chest X-ray ordered to verify placement.  CXR: pending.   [facility]

## 2017-08-28 NOTE — Progress Notes (Signed)
Dobhoff removed, found to be in lung by CXR verification by MD Simonds. Order placed for IR to place tube. Will continue to monitor patient.

## 2017-08-28 NOTE — Consult Note (Signed)
PULMONARY / CRITICAL CARE MEDICINE   Name: Andrew Horton MRN: [id number] DOB: July 05, 1946    ADMISSION DATE:  08/26/2017 CONSULTATION DATE: 08/26/2017  REFERRING MD: Dr. Marcille Blanco   CHIEF COMPLAINT: Seizure Activity   PT PROFILE: 82 M with hx or prolactinoma, no prior seizure history, admitted with status epilepticus and acute encephalopathy. Intubated in ED. Extubated 11/20 AM. Persistent encephalopathy requiring re-intubation 11/21 early AM.   MAJOR EVENTS/TEST RESULTS: 11/19 admission via EMS> ED with status epilepticus. Intubated in ED. Treated with phenytoin, then Keprpa 11/19 CTH: No acute findings 11/20 Extubated. Persistent encephalopathy > at times agitated. Dexmedetomidine ordered.  11/20 Neurology consultation: serum prolactin ordered. MRI brain, EEG ordered. Keppra continued 11/20 EEG:  general background slowing.  This finding may be seen with a diffuse disturbance that is etiologically nonspecific, but may include a post-ictal state, among other possibilities.  No epileptiform activity was noted 11/21 early AM: re-intubated for bradypnea and persistent encephalopathy 11/21 Empiric acyclovir initiated 11/21 MRI brain  INDWELLING DEVICES:: ETT 11/19 >> 11/20, 11/21 >>  R IJ CVL 11/21 >>   MICRO DATA: MRSA PCR 11/20 >> NEG   ANTIMICROBIALS:  Acyclovir 11/21 >>      SUBJECTIVE:  Intubated, sedated on propofol, occasionally agitated appearing.  Not F/C  VITAL SIGNS: BP (!) 94/49   Pulse 95   Temp 100.1 F (37.8 C) (Oral)   Resp 20   Ht 5\' 9"  (1.753 m)   Wt 104.2 kg (229 lb 11.5 oz)   SpO2 98%   BMI 33.92 kg/m   HEMODYNAMICS:    VENTILATOR SETTINGS: Vent Mode: PRVC FiO2 (%):  [28 %-70 %] 28 % Set Rate:  [16 bmp-18 bmp] 16 bmp Vt Set:  [550 mL] 550 mL PEEP:  [5 cmH20] 5 cmH20 Plateau Pressure:  [8 cmH20-16 cmH20] 8 cmH20  INTAKE / OUTPUT: I/O last 3 completed shifts: In: 3442.8 [I.V.:2082.8; IV Piggyback:1360] Out: 6378  [HYIFO:2774]  PHYSICAL EXAMINATION: General: Intubated, sedated on propofol, RASS -3 not F/C Neuro: PERRL, EOMI, MAEs HEENT: NCAT, sclerae white Neck: no JVD Cardiovascular: reg, no M Lungs: few rhonchi, no wheezes Abdomen: NABS, soft Ext: warm, no edema    LABS:  BMET Recent Labs  Lab 08/26/17 2201 08/27/17 1022 08/28/17 0220  NA 138 135 142  K 3.8 3.4* 3.4*  CL 102 104 108  CO2 16* 18* 23  BUN 17 19 16   CREATININE 1.34* 1.88* 1.30*  GLUCOSE 445* 506* 328*    Electrolytes Recent Labs  Lab 08/26/17 2201 08/27/17 1022 08/27/17 1446 08/28/17 0220  CALCIUM 9.1 8.5*  --  8.4*  MG  --   --  2.3 2.3  PHOS  --   --  2.8  --     CBC Recent Labs  Lab 08/26/17 2201 08/28/17 0220  WBC 11.3* 15.7*  HGB 16.2 16.2  HCT 48.8 49.3  PLT 212 191    Coag's Recent Labs  Lab 08/26/17 2201  APTT 26  INR 1.00    Sepsis Markers Recent Labs  Lab 08/27/17 1022 08/28/17 0220  PROCALCITON 0.16 0.13    ABG Recent Labs  Lab 08/27/17 0001 08/28/17 0300  PHART 7.33* 7.32*  PCO2ART 43 43  PO2ART 73* 151*    Liver Enzymes Recent Labs  Lab 08/27/17 1446  AST 27  ALT 25  ALKPHOS 71  BILITOT 1.2  ALBUMIN 3.7    Cardiac Enzymes Recent Labs  Lab 08/28/17 0220  TROPONINI 0.11*    Glucose Recent Labs  Lab  08/28/17 0037 08/28/17 0136 08/28/17 0330 08/28/17 0725 08/28/17 1135 08/28/17 1313  GLUCAP 164* 218* 313* 259* 288* 270*    CXR: No acute findings   ASSESSMENT / PLAN:  PULMONARY A: Acute ventilator dependent respiratory failure due to AMS P:   Cont full vent support - settings reviewed and/or adjusted Cont vent bundle Daily SBT if/when meets criteria  CARDIOVASCULAR A:  Hypotension - likely due to medications P:  Monitor rhythm and BP Consider vasopressors as needed to maintain MAP > 65 mmHg  RENAL A:   AKI, nonoliguric P:   Monitor BMET intermittently Monitor I/Os Correct electrolytes as indicated    GASTROINTESTINAL A:   No acute issues  P:    SUP: IV famotidine TF protocol initiated 11/21  HEMATOLOGIC A:   No acute issues  P:  DVT px: SQ [place] Monitor CBC intermittently Transfuse per usual guidelines    INFECTIOUS A:   Possible viral encephalitis P:   Monitor temp, WBC count Micro and abx as above    ENDOCRINE A:   Hyperglycemia On known whether he has prior history of diabetes P:   Continue moderate scale SSI  NEUROLOGIC A:   Status epilepticus New onset seizures Acute encephalopathy ICU/ventilator associated discomfort P:   RASS goal: -1, -2  PAD protocol - propofol, PRN fentanyl Prn lorazepam for seizure Continue Keppra MRI ordered for today Neurology following  FAMILY  He has no family and no designated HCPOA  CCM time: 45 mins The above time includes time spent in consultation with patient and/or family members and reviewing care plan on multidisciplinary rounds  Merton Border, MD PCCM service Mobile [phone] Pager [phone] 08/28/2017 2:38 PM

## 2017-08-28 NOTE — Procedures (Signed)
Central Venous Catheter Insertion Procedure Note Andrew Horton [id number] 01-02-1946  Procedure: Insertion of Central Venous Catheter Indications: Assessment of intravascular volume, Drug and/or fluid administration and Frequent blood sampling  Procedure Details Consent: Unable to obtain consent because of emergent medical necessity. Time Out: Verified patient identification, verified procedure, site/side was marked, verified correct patient position, special equipment/implants available, medications/allergies/relevent history reviewed, required imaging and test results available.  Performed  Maximum sterile technique was used including antiseptics, cap, gloves, gown, hand hygiene, mask and sheet. Skin prep: Chlorhexidine; local anesthetic administered A antimicrobial bonded/coated triple lumen catheter was placed in the right internal jugular vein using the Seldinger technique.  Evaluation Blood flow good Complications: No apparent complications Patient did tolerate procedure well. Chest X-ray ordered to verify placement.  CXR: pending.  Right internal jugular central line placed utilizing ultrasound no complications noted during or following procedure.  Marda Stalker, [place] Pager [phone] (please enter 7 digits) PCCM Consult Pager [phone] (please enter 7 digits)

## 2017-08-28 NOTE — Progress Notes (Signed)
Notified MD Simonds that patients BP had dropped SBP in 80's. 500cc bolus ordered and administered, see MAR. Will continue to monitor patient.

## 2017-08-28 NOTE — Progress Notes (Signed)
Multiple attempts to insert OG/NG for patient by different nurses. Unsuccessful at getting OG/NG placement at this time. Will reassess later.

## 2017-08-28 NOTE — Progress Notes (Signed)
Initial Nutrition Assessment  DOCUMENTATION CODES:   Obesity unspecified  INTERVENTION:  Plan was for initiation of Vital High Protein at 20 mL/hr today, but enteral access cannot be obtained at this time.  Once NGT/OGT placed and confirmed recommend initiating Vital High Protein at 20 mL/hr. Once patient is hemodynamically stable, recommend advancing to Vital High Protein at 30 mL/hr + Pro-Stat 60 mL TID. Goal regimen provides 1568 kcal, 153 grams of protein, 605 mL H2O daily.  When tube feeds are initiated recommend liquid multivitamin with minerals daily per tube as goal tube feed regimen does not meet 100% RDIs for vitamins/minerals.  NUTRITION DIAGNOSIS:   Inadequate oral intake related to inability to eat as evidenced by NPO status(patient currently intubated).  GOAL:   Provide needs based on ASPEN/SCCM guidelines  MONITOR:   Vent status, Labs, Weight trends, TF tolerance, I & O's  REASON FOR ASSESSMENT:   Ventilator, Consult Enteral/tube feeding initiation and management  ASSESSMENT:   71 year old male with PMHx of benign brain tumor presents with new onset seizure after being found in postictal state in bathroom of a gas station who was emergently intubated late on 11/19 and then extubated AM of 11/20. Patient had persistent encephalopathy following extubation which required re-intubation early AM of 11/21.   -Plan is for MRI of brain today.  No family members at bedside during RD assessment this morning. Weight appears stable in chart.  Access: NO enteral access at this time as OG/NG could not be placed at bedside; plan was for patient to go to IR for placement but those attempts were unsuccessful as well  MAP: 52-92 mmHg  Patient is currently intubated on ventilator support MV: 10 L/min Temp (24hrs), Avg:99.3 F (37.4 C), Min:98.3 F (36.8 C), Max:100.6 F (38.1 C)  Propofol: 9.4 ml/hr ( kcal daily)  Medications reviewed and include: Novolog 0-15 units  Q4hrs, Lantus 20 units daily, acyclovir, famotidine, Keppra, Levophed gtt, propofol gtt.  Labs reviewed: CBG 262-313, Potassium 3.4, Creatinine 1.3.  Patient does not meet criteria for malnutrition at this time.  Discussed with RN. Also discussed on rounds.  NUTRITION - FOCUSED PHYSICAL EXAM:    Most Recent Value  Orbital Region  No depletion  Upper Arm Region  No depletion  Thoracic and Lumbar Region  No depletion  Buccal Region  Unable to assess  Temple Region  No depletion  Clavicle Bone Region  No depletion  Clavicle and Acromion Bone Region  No depletion  Scapular Bone Region  Unable to assess  Dorsal Hand  No depletion  Patellar Region  No depletion  Anterior Thigh Region  No depletion  Posterior Calf Region  No depletion  Edema (RD Assessment)  Mild  Hair  Reviewed  Eyes  Unable to assess  Mouth  Unable to assess  Skin  Reviewed  Nails  Reviewed     Diet Order:  No diet orders on file  EDUCATION NEEDS:   No education needs have been identified at this time  Skin:  Skin Assessment: Reviewed RN Assessment  Last BM:  Unknown  Height:   Ht Readings from Last 1 Encounters:  08/27/17 5\' 9"  (1.753 m)    Weight:   Wt Readings from Last 1 Encounters:  08/28/17 229 lb 11.5 oz (104.2 kg)    Ideal Body Weight:  72.7 kg  BMI:  Body mass index is 33.92 kg/m.  Estimated Nutritional Needs:   Kcal:  8416-6063 (11-14 kcal/kg)  Protein:  >/= 145 grams (>/=  2 grams/kg)  Fluid:  1.8-2.2 L/day (25-30 mL/kg IBW)  Willey Blade, MS, RD, LDN Office: [phone] Pager: [phone] After Hours/Weekend Pager: [phone]

## 2017-08-28 NOTE — Care Management (Signed)
Andrew Horton   [phone]   RNCM attempted to reach out to emergency contact to see if there are any family members available to assist with patient progression. I was not able to leave a message at listed number.

## 2017-08-28 NOTE — Progress Notes (Signed)
Subjective: Patient became hypoxic and hypotensive overnight.  Now intubated and sedated.  No clinical seizure activity noted.    Objective: Current vital signs: BP (!) 94/49   Pulse 95   Temp 100.1 F (37.8 C) (Oral)   Resp 20   Ht '5\' 9"'$  (1.753 m)   Wt 104.2 kg (229 lb 11.5 oz)   SpO2 98%   BMI 33.92 kg/m  Vital signs in last 24 hours: Temp:  [98.3 F (36.8 C)-100.6 F (38.1 C)] 100.1 F (37.8 C) (11/21 1118) Pulse Rate:  [91-120] 95 (11/21 1300) Resp:  [16-25] 20 (11/21 1300) BP: (71-175)/(42-81) 94/49 (11/21 1300) SpO2:  [91 %-100 %] 98 % (11/21 1300) FiO2 (%):  [28 %-70 %] 28 % (11/21 1134) Weight:  [104.2 kg (229 lb 11.5 oz)] 104.2 kg (229 lb 11.5 oz) (11/21 0331)  Intake/Output from previous day: 11/20 0701 - 11/21 0700 In: 2192.8 [I.V.:2082.8; IV Piggyback:110] Out: 9767 [Urine:1525] Intake/Output this shift: Total I/O In: 1126.8 [I.V.:123.4; IV Piggyback:1003.5] Out: 120 [Urine:120] Nutritional status: No diet orders on file  Neurologic Exam: Mental Status: Patient does not respond to verbal stimuli.  Does not respond to deep sternal rub.  Does not follow commands.  No verbalizations noted.  Cranial Nerves: II: patient does not respond confrontation bilaterally, pupils right 3 mm, left 3 mm,and reactive bilaterally III,IV,VI: doll's response absent bilaterally.  V,VII: corneal reflex absent bilaterally  VIII: patient does not respond to verbal stimuli IX,X: gag reflex reduced, XI: trapezius strength unable to test bilaterally XII: tongue strength unable to test Motor: Extremities flaccid throughout.  No spontaneous movement noted.  No purposeful movements noted. Sensory: Does not respond to noxious stimuli in any extremity.   Lab Results: Basic Metabolic Panel: Recent Labs  Lab 08/26/17 2201 08/27/17 1022 08/27/17 1446 08/28/17 0220  NA 138 135  --  142  K 3.8 3.4*  --  3.4*  CL 102 104  --  108  CO2 16* 18*  --  23  GLUCOSE 445* 506*  --  328*   BUN 17 19  --  16  CREATININE 1.34* 1.88*  --  1.30*  CALCIUM 9.1 8.5*  --  8.4*  MG  --   --  2.3 2.3  PHOS  --   --  2.8  --     Liver Function Tests: Recent Labs  Lab 08/27/17 1446  AST 27  ALT 25  ALKPHOS 71  BILITOT 1.2  PROT 7.0  ALBUMIN 3.7   No results for input(s): LIPASE, AMYLASE in the last 168 hours. No results for input(s): AMMONIA in the last 168 hours.  CBC: Recent Labs  Lab 08/26/17 2201 08/28/17 0220  WBC 11.3* 15.7*  NEUTROABS 6.8* 13.5*  HGB 16.2 16.2  HCT 48.8 49.3  MCV 89.8 88.2  PLT 212 191    Cardiac Enzymes: Recent Labs  Lab 08/28/17 0220  TROPONINI 0.11*    Lipid Panel: Recent Labs  Lab 08/28/17 0220  TRIG 205*    CBG: Recent Labs  Lab 08/28/17 0136 08/28/17 0330 08/28/17 0725 08/28/17 1135 08/28/17 1313  GLUCAP 218* 313* 259* 288* 270*    Microbiology: Results for orders placed or performed during the hospital encounter of 08/26/17  MRSA PCR Screening     Status: None   Collection Time: 08/27/17  3:24 AM  Result Value Ref Range Status   MRSA by PCR NEGATIVE NEGATIVE Final    Comment:        The GeneXpert MRSA Assay (  FDA approved for NASAL specimens only), is one component of a comprehensive MRSA colonization surveillance program. It is not intended to diagnose MRSA infection nor to guide or monitor treatment for MRSA infections.     Coagulation Studies: Recent Labs    08/26/17 01/05/00  LABPROT 13.1  INR 1.00    Imaging: Dg Chest 1 View  Result Date: 08/28/2017 CLINICAL DATA:  Endotracheal tube placement EXAM: CHEST 1 VIEW COMPARISON:  08/26/2017 FINDINGS: Endotracheal tube with tip measuring 5.1 cm above the carina. Shallow inspiration with linear atelectasis in the lung bases. Heart size and pulmonary vascularity are normal. No blunting of costophrenic angles. No pneumothorax. IMPRESSION: Endotracheal tube appears in satisfactory position. Shallow inspiration with linear atelectasis in the lung bases.  Electronically Signed   By: Lucienne Capers M.D.   On: 08/28/2017 02:26   Dg Chest 1 View  Addendum Date: 08/27/2017   ADDENDUM REPORT: 08/27/2017 01:21 ADDENDUM: The patient was returned for additional axial CT images of the brain with coronal and sagittal multiplanar reformats. These additional images were added to the order for the current study. Intracranial contents demonstrate mild cerebral atrophy. Ventricular dilatation consistent with central atrophy. No mass effect or midline shift. No abnormal extra-axial fluid collections. Gray-white matter junctions are distinct. Basal cisterns are not effaced. No evidence of acute intracranial hemorrhage internal carotid artery and vertebrobasilar artery calcifications are present. Calvarium appears intact. Mucosal thickening in the paranasal sinuses likely inflammatory. No acute air-fluid levels. Mastoid air cells are not opacified. IMPRESSION: No acute intracranial abnormalities. Chronic atrophy and small vessel ischemic changes. Electronically Signed   By: Lucienne Capers M.D.   On: 08/27/2017 01:21   Result Date: 08/27/2017 CLINICAL DATA:  Post intubation.  Seizure and fall. EXAM: CHEST 1 VIEW COMPARISON:  None. FINDINGS: An endotracheal tube has been placed with tip measuring 4.5 cm above the carina. An enteric tube was placed. The tip is off the field of view but below the left hemidiaphragm. Shallow inspiration with atelectasis in the lung bases. Heart size and pulmonary vascularity are normal for technique. No focal consolidation in the lungs. No blunting of costophrenic angles. No pneumothorax. IMPRESSION: Appliances appear in satisfactory position. Shallow inspiration with atelectasis in the lung bases. Electronically Signed: By: Lucienne Capers M.D. On: 08/27/2017 00:27   Dg Abd 1 View  Result Date: 08/28/2017 CLINICAL DATA:  71 year old male with a history of nasogastric tube placement EXAM: ABDOMEN - 1 VIEW COMPARISON:  08/28/2017 FINDINGS:  Limited plain film of the upper abdomen for placement of enteric tube. Metallic tip enteric tube projects over the base the right lung, likely within the airway. Defibrillator pads project over the upper abdomen. Unremarkable gas pattern of the upper abdomen. Cholecystectomy IMPRESSION: Plain film of the upper abdomen demonstrates metallic tip enteric tube with coaxial wire projecting over the base of the right lung. These results were called by telephone at the time of interpretation on 08/28/2017 at 9:46 am to the nurse caring for the patient, Ms Casey Burkitt, who verbally acknowledged these results, and reported that the enteric tube has already been removed. Electronically Signed   By: Corrie Mckusick D.O.   On: 08/28/2017 09:48   Ct Head Wo Contrast  Addendum Date: 08/27/2017   ADDENDUM REPORT: 08/27/2017 02:04 ADDENDUM: The patient was returned for additional axial CT images of the brain with coronal and sagittal multiplanar reformatted images. These additional images were added to the order for the current study. Intracranial contents demonstrate mild cerebral atrophy. Ventricular dilatation  consistent with central atrophy. No mass effect or midline shift. No abnormal extra-axial fluid collections. Gray-white matter junctions are distinct. Basal cisterns are not effaced. No evidence of acute intracranial hemorrhage internal carotid artery and vertebrobasilar artery calcifications are present. Calvarium appears intact. Mucosal thickening in the paranasal sinuses likely inflammatory. No acute air-fluid levels. Mastoid air cells are not opacified. IMPRESSION: No acute intracranial abnormalities. Chronic atrophy and small vessel ischemic changes. Electronically Signed   By: Lucienne Capers M.D.   On: 08/27/2017 02:04   Result Date: 08/27/2017 CLINICAL DATA:  Seizures. Patient was uncooperative despite sedation. History of diabetes. EXAM: CT HEAD WITHOUT CONTRAST TECHNIQUE: Contiguous axial images were  obtained from the base of the skull through the vertex without intravenous contrast. COMPARISON:  None. FINDINGS: Brain: Examination is technically limited due to patient motion. There is severe limitation of visualization in some areas. As visualized, there is no gross evidence of acute intracranial hemorrhage or mass effect. There is bilateral cerebral atrophy with ventricular dilatation likely due to central atrophy. No abnormal extra-axial fluid collections are appreciated. Vascular: Vascular calcifications are present. Skull: Visualize calvarium appears intact. Sinuses/Orbits: Visualized paranasal sinuses and mastoid air cells are clear. Other: None. IMPRESSION: Examination is limited due to motion artifact. No gross evidence of acute intracranial abnormality. Chronic atrophy. Electronically Signed: By: Lucienne Capers M.D. On: 08/26/2017 23:23   Dg Pelvis Portable  Result Date: 08/28/2017 CLINICAL DATA:  Patient for MRI.  Clearance examination. EXAM: PORTABLE PELVIS 1-2 VIEWS COMPARISON:  None. FINDINGS: No radiopaque foreign body is identified. No acute abnormality is seen. There is some degenerative disease about the right hip. IMPRESSION: Negative for foreign body.  Patient may proceed to MRI. Electronically Signed   By: Inge Rise M.D.   On: 08/28/2017 08:41   Dg Chest Port 1 View  Result Date: 08/28/2017 CLINICAL DATA:  Status post central line placement today. EXAM: PORTABLE CHEST 1 VIEW COMPARISON:  Single-view of the chest earlier today. FINDINGS: New right IJ catheter tip projects in the mid to lower superior vena cava. No pneumothorax. Endotracheal tube tip is just below the clavicular heads in good position. Basilar atelectasis is unchanged. IMPRESSION: Right IJ catheter tip projects in the mid to lower superior vena cava. ETT in good position. No change in basilar atelectasis. Electronically Signed   By: Inge Rise M.D.   On: 08/28/2017 13:35   Dg Abd Portable 1v  Result  Date: 08/28/2017 CLINICAL DATA:  MRI clearance. EXAM: PORTABLE ABDOMEN - 1 VIEW COMPARISON:  None. FINDINGS: Defibrillator pad is seen in the epigastric region. Surgical clips in the right upper quadrant. No unexpected metallic foreign body. Mild gaseous distention of small bowel and colon. IMPRESSION: 1. No unexpected metallic foreign body. 2. Mild gaseous distention of small bowel and colon can be seen with an ileus. Electronically Signed   By: Lorin Picket M.D.   On: 08/28/2017 08:41    Medications:  I have reviewed the patient's current medications. Scheduled: . aspirin  81 mg Per Tube Daily  . bromocriptine  12.5 mg Per Tube Daily  . chlorhexidine gluconate (MEDLINE KIT)  15 mL Mouth Rinse BID  . enoxaparin (LOVENOX) injection  40 mg Subcutaneous Q24H  . feeding supplement (VITAL HIGH PROTEIN)  1,000 mL Per Tube Q24H  . free water  200 mL Per Tube Q8H  . insulin aspart  0-15 Units Subcutaneous Q4H  . insulin glargine  20 Units Subcutaneous Daily  . mouth rinse  15 mL Mouth Rinse  10 times per day    Assessment/Plan: Patient now intubated and sedated.  EEG from yesterday with general background slowing but no evidence of electrographic seizure activity.  On Keppra at '1000mg'$  q12 hours.  Mag, phosphorus and hepatic function normal.  Prolactin 112.8.  Bromocryptine started.  Patient on Acyclovir as well.    Recommendations: 1.  MRI of the brain with and without contrast 2.  Continue Keppra 3.  Continue seizure precautions    LOS: 1 day   Alexis Goodell, MD Neurology [phone] 08/28/2017  2:12 PM

## 2017-08-28 NOTE — Progress Notes (Signed)
ANTIBIOTIC CONSULT NOTE - INITIAL  Pharmacy Consult for acyclovir Indication: virus  Allergies  Allergen Reactions  . Sulfa Antibiotics Hives    Patient Measurements: Height: 5\' 9"  (175.3 cm) Weight: 229 lb 11.5 oz (104.2 kg) IBW/kg (Calculated) : 70.7 Adjusted Body Weight: 84.1 kg  Vital Signs: Temp: 98.3 F (36.8 C) (11/21 0400) Temp Source: Oral (11/21 0400) BP: 106/56 (11/21 0700) Pulse Rate: 97 (11/21 0700) Intake/Output from previous day: 11/20 0701 - 11/21 0700 In: 2192.8 [I.V.:2082.8; IV Piggyback:110] Out: 2993 [Urine:1525] Intake/Output from this shift: No intake/output data recorded.  Labs: Recent Labs    08/26/17 2201 08/27/17 1022 08/28/17 0220  WBC 11.3*  --  15.7*  HGB 16.2  --  16.2  PLT 212  --  191  CREATININE 1.34* 1.88* 1.30*   Estimated Creatinine Clearance: 62 mL/min (A) (by C-G formula based on SCr of 1.3 mg/dL (H)). No results for input(s): VANCOTROUGH, VANCOPEAK, VANCORANDOM, GENTTROUGH, GENTPEAK, GENTRANDOM, TOBRATROUGH, TOBRAPEAK, TOBRARND, AMIKACINPEAK, AMIKACINTROU, AMIKACIN in the last 72 hours.   Microbiology: Recent Results (from the past 720 hour(s))  MRSA PCR Screening     Status: None   Collection Time: 08/27/17  3:24 AM  Result Value Ref Range Status   MRSA by PCR NEGATIVE NEGATIVE Final    Comment:        The GeneXpert MRSA Assay (FDA approved for NASAL specimens only), is one component of a comprehensive MRSA colonization surveillance program. It is not intended to diagnose MRSA infection nor to guide or monitor treatment for MRSA infections.     Medical History: Past Medical History:  Diagnosis Date  . Brain tumor (benign) ([place])     Medications:  Infusions:  . acyclovir    . dexmedetomidine (PRECEDEX) IV infusion Stopped (08/28/17 0145)  . lactated ringers 50 mL/hr at 08/28/17 0600  . levETIRAcetam Stopped (08/27/17 2210)  . propofol (DIPRIVAN) infusion 25 mcg/kg/min (08/28/17 0704)   Assessment: 54  yom with respiratory distress, now intubated. Pharmacy consulted to dose acyclovir.  Goal of Therapy:  Resolve infection, prevent ADE  Plan:  Acyclovir 840 mg IV Q8H. Dose is 10 mg/kg using adjusted body weight due to BMI > 30. Frequency Q8H for CrCl > 50 mL/min.    Laural Benes, Pharm.D., BCPS Clinical Pharmacist 08/28/2017,7:49 AM

## 2017-08-28 NOTE — Progress Notes (Signed)
Notified MD Simonds that BP was not responding to 500 cc bolus. SBP now in 70's, MAP 52. Repeat 500cc bolus ordered and administered. Simonds to round on patient next. Will continue to monitor patient.

## 2017-08-28 NOTE — Progress Notes (Signed)
[facility] [place] NAME: Andrew Horton    MR#:  [id number]  DATE OF BIRTH:  08-01-1946  SUBJECTIVE:  Patient reintubated this morning due to unable to protect airways and agitation.  Currently sedated on the ventilator.  Off Levophed now.  REVIEW OF SYSTEMS:   Review of Systems  Unable to perform ROS: Intubated    DRUG ALLERGIES:   Allergies  Allergen Reactions  . Sulfa Antibiotics Hives    VITALS:  Blood pressure (!) 95/48, pulse 86, temperature 99 F (37.2 C), temperature source Oral, resp. rate 16, height 5\' 9"  (1.753 m), weight 104.2 kg (229 lb 11.5 oz), SpO2 99 %.  PHYSICAL EXAMINATION:   Physical Exam  GENERAL:  71 y.o.-year-old patient lying in the bed with no acute distress.  Critically illl EYES: Pupils equal, round, reactive to light and accommodation. No scleral icterus. Extraocular muscles intact.  HEENT: Head atraumatic, normocephalic. Oropharynx and nasopharynx clear.  Intubated and on the ventilator  nECK:  Supple, no jugular venous distention. No thyroid enlargement, no tenderness.  LUNGS: Normal breath sounds bilaterally, no wheezing, rales, rhonchi. No use of accessory muscles of respiration.  CARDIOVASCULAR: S1, S2 normal. No murmurs, rubs, or gallops.  ABDOMEN: Soft, nontender, nondistended.  No organomegaly or mass.  EXTREMITIES: No cyanosis, clubbing or edema b/l.    NEUROLOGIC: Intubated PSYCHIATRIC: On the vent SKIN: No obvious rash, lesion, or ulcer.   LABORATORY PANEL:  CBC Recent Labs  Lab 08/28/17 0220  WBC 15.7*  HGB 16.2  HCT 49.3  PLT 191    Chemistries  Recent Labs  Lab 08/27/17 1446 08/28/17 0220  NA  --  142  K  --  3.4*  CL  --  108  CO2  --  23  GLUCOSE  --  328*  BUN  --  16  CREATININE  --  1.30*  CALCIUM  --  8.4*  MG 2.3 2.3  AST 27  --   ALT 25  --   ALKPHOS 71  --   BILITOT 1.2  --    Cardiac Enzymes Recent Labs  Lab 08/28/17 0220  TROPONINI 0.11*    RADIOLOGY:  Dg Chest 1 View  Result Date: 08/28/2017 CLINICAL DATA:  Endotracheal tube placement EXAM: CHEST 1 VIEW COMPARISON:  08/26/2017 FINDINGS: Endotracheal tube with tip measuring 5.1 cm above the carina. Shallow inspiration with linear atelectasis in the lung bases. Heart size and pulmonary vascularity are normal. No blunting of costophrenic angles. No pneumothorax. IMPRESSION: Endotracheal tube appears in satisfactory position. Shallow inspiration with linear atelectasis in the lung bases. Electronically Signed   By: Lucienne Capers M.D.   On: 08/28/2017 02:26   Dg Chest 1 View  Addendum Date: 08/27/2017   ADDENDUM REPORT: 08/27/2017 01:21 ADDENDUM: The patient was returned for additional axial CT images of the brain with coronal and sagittal multiplanar reformats. These additional images were added to the order for the current study. Intracranial contents demonstrate mild cerebral atrophy. Ventricular dilatation consistent with central atrophy. No mass effect or midline shift. No abnormal extra-axial fluid collections. Gray-white matter junctions are distinct. Basal cisterns are not effaced. No evidence of acute intracranial hemorrhage internal carotid artery and vertebrobasilar artery calcifications are present. Calvarium appears intact. Mucosal thickening in the paranasal sinuses likely inflammatory. No acute air-fluid levels. Mastoid air cells are not opacified. IMPRESSION: No acute intracranial abnormalities. Chronic atrophy and small vessel ischemic changes. Electronically Signed   By: Lucienne Capers  M.D.   On: 08/27/2017 01:21   Result Date: 08/27/2017 CLINICAL DATA:  Post intubation.  Seizure and fall. EXAM: CHEST 1 VIEW COMPARISON:  None. FINDINGS: An endotracheal tube has been placed with tip measuring 4.5 cm above the carina. An enteric tube was placed. The tip is off the field of view but below the left hemidiaphragm. Shallow inspiration with atelectasis in the lung bases.  Heart size and pulmonary vascularity are normal for technique. No focal consolidation in the lungs. No blunting of costophrenic angles. No pneumothorax. IMPRESSION: Appliances appear in satisfactory position. Shallow inspiration with atelectasis in the lung bases. Electronically Signed: By: Lucienne Capers M.D. On: 08/27/2017 00:27   Dg Abd 1 View  Result Date: 08/28/2017 CLINICAL DATA:  71 year old male with a history of nasogastric tube placement EXAM: ABDOMEN - 1 VIEW COMPARISON:  08/28/2017 FINDINGS: Limited plain film of the upper abdomen for placement of enteric tube. Metallic tip enteric tube projects over the base the right lung, likely within the airway. Defibrillator pads project over the upper abdomen. Unremarkable gas pattern of the upper abdomen. Cholecystectomy IMPRESSION: Plain film of the upper abdomen demonstrates metallic tip enteric tube with coaxial wire projecting over the base of the right lung. These results were called by telephone at the time of interpretation on 08/28/2017 at 9:46 am to the nurse caring for the patient, Ms Casey Burkitt, who verbally acknowledged these results, and reported that the enteric tube has already been removed. Electronically Signed   By: Corrie Mckusick D.O.   On: 08/28/2017 09:48   Ct Head Wo Contrast  Addendum Date: 08/27/2017   ADDENDUM REPORT: 08/27/2017 02:04 ADDENDUM: The patient was returned for additional axial CT images of the brain with coronal and sagittal multiplanar reformatted images. These additional images were added to the order for the current study. Intracranial contents demonstrate mild cerebral atrophy. Ventricular dilatation consistent with central atrophy. No mass effect or midline shift. No abnormal extra-axial fluid collections. Gray-white matter junctions are distinct. Basal cisterns are not effaced. No evidence of acute intracranial hemorrhage internal carotid artery and vertebrobasilar artery calcifications are present.  Calvarium appears intact. Mucosal thickening in the paranasal sinuses likely inflammatory. No acute air-fluid levels. Mastoid air cells are not opacified. IMPRESSION: No acute intracranial abnormalities. Chronic atrophy and small vessel ischemic changes. Electronically Signed   By: Lucienne Capers M.D.   On: 08/27/2017 02:04   Result Date: 08/27/2017 CLINICAL DATA:  Seizures. Patient was uncooperative despite sedation. History of diabetes. EXAM: CT HEAD WITHOUT CONTRAST TECHNIQUE: Contiguous axial images were obtained from the base of the skull through the vertex without intravenous contrast. COMPARISON:  None. FINDINGS: Brain: Examination is technically limited due to patient motion. There is severe limitation of visualization in some areas. As visualized, there is no gross evidence of acute intracranial hemorrhage or mass effect. There is bilateral cerebral atrophy with ventricular dilatation likely due to central atrophy. No abnormal extra-axial fluid collections are appreciated. Vascular: Vascular calcifications are present. Skull: Visualize calvarium appears intact. Sinuses/Orbits: Visualized paranasal sinuses and mastoid air cells are clear. Other: None. IMPRESSION: Examination is limited due to motion artifact. No gross evidence of acute intracranial abnormality. Chronic atrophy. Electronically Signed: By: Lucienne Capers M.D. On: 08/26/2017 23:23   Mr Jeri Cos YI Contrast  Result Date: 08/28/2017 CLINICAL DATA:  71 year old male found in the postictal type, and then witnessed seizure activity in the emergency department. EXAM: MRI HEAD WITHOUT AND WITH CONTRAST TECHNIQUE: Multiplanar, multiecho pulse sequences of the brain  and surrounding structures were obtained without and with intravenous contrast. CONTRAST:  73mL MULTIHANCE GADOBENATE DIMEGLUMINE 529 MG/ML IV SOLN COMPARISON:  Head CT without contrast 08/26/2017. FINDINGS: Brain: Ventricular prominence is felt to be ex vacuo in nature. No  transependymal edema. No restricted diffusion to suggest acute infarction. No midline shift, mass effect, extra-axial collection or acute intracranial hemorrhage. Cervicomedullary junction and pituitary are within normal limits. The hippocampal formations appear symmetric and within normal limits. Pearline Cables and white matter signal is within normal limits for age throughout the brain. No cortical encephalomalacia or chronic cerebral blood products identified. There is asymmetric enhancing soft tissue in the left cavernous sinus which appears distinct from the pituitary (on series 12, image 18). The estimated lesion size is 18 x 16 x 15 mm (AP by transverse by CC). The right cavernous sinus has a more normal appearance. No associated abnormality of the left ICA siphon flow void. Meckel cave is uninvolved. No definite regional dural thickening or extension into the left middle cranial fossa. No other abnormal enhancement identified. No other intracranial mass lesion identified. Vascular: Major intracranial vascular flow voids are preserved with dominant appearing distal left vertebral artery and mild generalized intracranial artery dolichoectasia. Skull and upper cervical spine: Skull and skullbase bone marrow signal is preserved. Negative visualized cervical spine and spinal cord. Sinuses/Orbits: Orbits soft tissues appear normal. The left orbital apex is not clearly involved by the left cavernous sinus lesion. New line trace paranasal sinus fluid or mucosal thickening. Other: Fluid layering in the pharynx due to intubation. Endotracheal tube visible in the right oral cavity. Left oral tongue swelling and edema (series 12 image 26. Bilateral nasal cavity mucosal edema and hyperenhancement. Visible internal auditory structures appear normal. Mastoids are clear. Scalp soft tissues appear negative. IMPRESSION: 1. 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  No definite extension outside of the left cavernous sinus. The left ICA siphon remains patent. 2. No other intracranial mass identified and otherwise no acute intracranial abnormality. 3. Intubated.  Likely posttraumatic edema in the left oral tongue. Electronically Signed   By: Genevie Ann M.D.   On: 08/28/2017 15:16   Dg Pelvis Portable  Result Date: 08/28/2017 CLINICAL DATA:  Patient for MRI.  Clearance examination. EXAM: PORTABLE PELVIS 1-2 VIEWS COMPARISON:  None. FINDINGS: No radiopaque foreign body is identified. No acute abnormality is seen. There is some degenerative disease about the right hip. IMPRESSION: Negative for foreign body.  Patient may proceed to MRI. Electronically Signed   By: Inge Rise M.D.   On: 08/28/2017 08:41   Dg Chest Port 1 View  Result Date: 08/28/2017 CLINICAL DATA:  Status post central line placement today. EXAM: PORTABLE CHEST 1 VIEW COMPARISON:  Single-view of the chest earlier today. FINDINGS: New right IJ catheter tip projects in the mid to lower superior vena cava. No pneumothorax. Endotracheal tube tip is just below the clavicular heads in good position. Basilar atelectasis is unchanged. IMPRESSION: Right IJ catheter tip projects in the mid to lower superior vena cava. ETT in good position. No change in basilar atelectasis. Electronically Signed   By: Inge Rise M.D.   On: 08/28/2017 13:35   Dg Abd Portable 1v  Result Date: 08/28/2017 CLINICAL DATA:  MRI clearance. EXAM: PORTABLE ABDOMEN - 1 VIEW COMPARISON:  None. FINDINGS: Defibrillator pad is seen in the epigastric region. Surgical clips in the right upper quadrant. No unexpected metallic foreign body. Mild gaseous  distention of small bowel and colon. IMPRESSION: 1. No unexpected metallic foreign body. 2. Mild gaseous distention of small bowel and colon can be seen with an ileus. Electronically Signed   By: Lorin Picket M.D.   On: 08/28/2017 08:41   Dg Addison Bailey G Tube Plc W/fl W/rad  Result Date:  08/28/2017 CLINICAL DATA:  Attempt at nasogastric tube placement. EXAM: NASO G TUBE PLACEMENT WITH FL AND WITH RAD CONTRAST:  None FLUOROSCOPY TIME:  Fluoroscopy Time:  3 minutes 6 seconds Radiation Exposure Index (if provided by the fluoroscopic device): 870 micro Gy per meters square Number of Acquired Spot Images: 1 screen save COMPARISON:  None FINDINGS: The patient is intubated and sedated. An initial attempt was made to place a standard nasogastric tube. However, this would coil in the patient's mouth as the tip was stopped at the level of the endotracheal tube cuff. Multiple attempts were made to pass the tube into the proximal esophagus but these were unsuccessful. Attempts were then made to place a Dobhoff tube. The tip passed repeatedly into the proximal trachea and would pass the ET tube cuff and inter the right mainstem bronchus. The table position was raised approximately 15 degrees an additional attempt was made but this time the tube tip passed into the left mainstem bronchus. The study was then discontinued. IMPRESSION: Unsuccessful attempts to place a nasogastric and subsequently a Dobhoff feeding tube under fluoroscopic guidance. The patient tolerated the procedure reasonably well. Electronically Signed   By: David  Martinique M.D.   On: 08/28/2017 15:53   ASSESSMENT AND PLAN:  71 year old male admitted for status epilepticus. 1. Status epilepticus: New onset seizures.  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 This is nonspecific but meningioma is favored - EEG with general background slowing but no evidence of electrographic seizure activity - neurology consult appreciated. - continue IV Keppra  2. Acute respiratory failure: With hypoxemia; intubated for airway protection.  -Patient reintubated today   3. Acute kidney injury: Hydrate with intravenous fluid. Avoid nephrotoxic agents.  Repeat BMP in the  morning  4. Hypertension: Continue current medications  5. Diabetes mellitus type 2: -Sliding scale insulin  6. Hyperlipidemia: Continue statin therapy  7. DVT prophylaxis: Heparin  Case discussed with Care Management/Social Worker. Management plans discussed with the patient, family and they are in agreement.  CODE STATUS: full  DVT Prophylaxis: Heparin  TOTAL TIME TAKING CARE OF THIS PATIENT: *30* minutes.  >50% time spent on counselling and coordination of care  POSSIBLE D/C IN several DAYS, DEPENDING ON CLINICAL CONDITION.  Note: This dictation was prepared with Dragon dictation along with smaller phrase technology. Any transcriptional errors that result from this process are unintentional.  Fritzi Mandes M.D on 08/28/2017 at 8:06 PM  Between 7am to 6pm - Pager - [phone]  After 6pm go to [email or web address] - password [email or web address] [facility]  Office  [phone]  CC: Primary care physician; System, Pcp Not In

## 2017-08-28 NOTE — Progress Notes (Signed)
   I was called into the patient room.  Patient was bradycardic, diaphoretic and breathing only 6-8 times per minute.  Gave 1mg  of atropine Patient was emergently intubated. Patient did not loose his pulse. BG was 218mg /dl. Now patient is intubated and on ventilator.  Vent and sedation orders placed.   [facility]

## 2017-08-29 ENCOUNTER — Inpatient Hospital Stay: Payer: Medicare Other

## 2017-08-29 DIAGNOSIS — J9601 Acute respiratory failure with hypoxia: Secondary | ICD-10-CM

## 2017-08-29 LAB — PROCALCITONIN: Procalcitonin: 0.49 ng/mL

## 2017-08-29 LAB — GLUCOSE, CAPILLARY
Glucose-Capillary: 129 mg/dL — ABNORMAL HIGH (ref 65–99)
Glucose-Capillary: 131 mg/dL — ABNORMAL HIGH (ref 65–99)
Glucose-Capillary: 131 mg/dL — ABNORMAL HIGH (ref 65–99)
Glucose-Capillary: 132 mg/dL — ABNORMAL HIGH (ref 65–99)
Glucose-Capillary: 134 mg/dL — ABNORMAL HIGH (ref 65–99)
Glucose-Capillary: 136 mg/dL — ABNORMAL HIGH (ref 65–99)
Glucose-Capillary: 140 mg/dL — ABNORMAL HIGH (ref 65–99)
Glucose-Capillary: 143 mg/dL — ABNORMAL HIGH (ref 65–99)
Glucose-Capillary: 144 mg/dL — ABNORMAL HIGH (ref 65–99)
Glucose-Capillary: 145 mg/dL — ABNORMAL HIGH (ref 65–99)
Glucose-Capillary: 146 mg/dL — ABNORMAL HIGH (ref 65–99)
Glucose-Capillary: 148 mg/dL — ABNORMAL HIGH (ref 65–99)
Glucose-Capillary: 148 mg/dL — ABNORMAL HIGH (ref 65–99)
Glucose-Capillary: 150 mg/dL — ABNORMAL HIGH (ref 65–99)
Glucose-Capillary: 152 mg/dL — ABNORMAL HIGH (ref 65–99)
Glucose-Capillary: 158 mg/dL — ABNORMAL HIGH (ref 65–99)
Glucose-Capillary: 160 mg/dL — ABNORMAL HIGH (ref 65–99)
Glucose-Capillary: 164 mg/dL — ABNORMAL HIGH (ref 65–99)
Glucose-Capillary: 174 mg/dL — ABNORMAL HIGH (ref 65–99)
Glucose-Capillary: 180 mg/dL — ABNORMAL HIGH (ref 65–99)
Glucose-Capillary: 188 mg/dL — ABNORMAL HIGH (ref 65–99)
Glucose-Capillary: 197 mg/dL — ABNORMAL HIGH (ref 65–99)

## 2017-08-29 LAB — CBC
HEMATOCRIT: 40.1 % (ref 40.0–52.0)
HEMOGLOBIN: 13.8 g/dL (ref 13.0–18.0)
MCH: 30.3 pg (ref 26.0–34.0)
MCHC: 34.5 g/dL (ref 32.0–36.0)
MCV: 88 fL (ref 80.0–100.0)
PLATELETS: 160 10*3/uL (ref 150–440)
RBC: 4.56 MIL/uL (ref 4.40–5.90)
RDW: 13.6 % (ref 11.5–14.5)
WBC: 10.9 10*3/uL — AB (ref 3.8–10.6)

## 2017-08-29 LAB — COMPREHENSIVE METABOLIC PANEL
ALK PHOS: 54 U/L (ref 38–126)
ALT: 20 U/L (ref 17–63)
AST: 22 U/L (ref 15–41)
Albumin: 3 g/dL — ABNORMAL LOW (ref 3.5–5.0)
Anion gap: 8 (ref 5–15)
BUN: 25 mg/dL — AB (ref 6–20)
CALCIUM: 8 mg/dL — AB (ref 8.9–10.3)
CHLORIDE: 109 mmol/L (ref 101–111)
CO2: 24 mmol/L (ref 22–32)
CREATININE: 1.55 mg/dL — AB (ref 0.61–1.24)
GFR calc Af Amer: 50 mL/min — ABNORMAL LOW (ref >60)
GFR, EST NON AFRICAN AMERICAN: 43 mL/min — AB (ref >60)
Glucose, Bld: 163 mg/dL — ABNORMAL HIGH (ref 65–99)
Potassium: 3.4 mmol/L — ABNORMAL LOW (ref 3.5–5.1)
Sodium: 141 mmol/L (ref 135–145)
Total Bilirubin: 0.7 mg/dL (ref 0.3–1.2)
Total Protein: 6 g/dL — ABNORMAL LOW (ref 6.5–8.1)

## 2017-08-29 LAB — MAGNESIUM: MAGNESIUM: 2 mg/dL (ref 1.7–2.4)

## 2017-08-29 MED ORDER — SODIUM CHLORIDE 0.9 % IV BOLUS (SEPSIS)
500.0000 mL | Freq: Once | INTRAVENOUS | Status: AC
  Administered 2017-08-29: 500 mL via INTRAVENOUS

## 2017-08-29 MED ORDER — DEXMEDETOMIDINE HCL IN NACL 400 MCG/100ML IV SOLN
0.4000 ug/kg/h | INTRAVENOUS | Status: DC
  Administered 2017-08-29: 0.4 ug/kg/h via INTRAVENOUS
  Administered 2017-08-29: 1 ug/kg/h via INTRAVENOUS
  Administered 2017-08-29: 0.8 ug/kg/h via INTRAVENOUS
  Administered 2017-08-30: 1.2 ug/kg/h via INTRAVENOUS
  Filled 2017-08-29 (×4): qty 100

## 2017-08-29 MED ORDER — FENTANYL CITRATE (PF) 100 MCG/2ML IJ SOLN
50.0000 ug | INTRAMUSCULAR | Status: DC | PRN
  Administered 2017-08-30 – 2017-08-31 (×5): 100 ug via INTRAVENOUS
  Filled 2017-08-29 (×3): qty 2

## 2017-08-29 MED ORDER — POTASSIUM CHLORIDE 10 MEQ/100ML IV SOLN
10.0000 meq | INTRAVENOUS | Status: AC
Start: 2017-08-29 — End: 2017-08-29
  Administered 2017-08-29 (×3): 10 meq via INTRAVENOUS
  Filled 2017-08-29 (×3): qty 100

## 2017-08-29 NOTE — Progress Notes (Signed)
Subjective: Patient remains intubated and sedated.  When sedation decreased patient becomes agitated.  No seizures noted.    Objective: Current vital signs: BP (!) 102/53   Pulse 67   Temp 99.1 F (37.3 C) (Axillary)   Resp 17   Ht _0  (1.753 m)   Wt 107 kg (235 lb 14.3 oz)   SpO2 99%   BMI 34.84 kg/m  Vital signs in last 24 hours: Temp:  [98.7 F (37.1 C)-100 F (37.8 C)] 99.1 F (37.3 C) (11/22 1600) Pulse Rate:  [66-99] 67 (11/22 1700) Resp:  [15-32] 17 (11/22 1700) BP: (71-136)/(45-87) 102/53 (11/22 1700) SpO2:  [96 %-100 %] 99 % (11/22 1700) FiO2 (%):  [28 %] 28 % (11/22 1600) Weight:  [107 kg (235 lb 14.3 oz)] 107 kg (235 lb 14.3 oz) (11/22 0434)  Intake/Output from previous day: 11/21 0701 - 11/22 0700 In: 1874.2 [I.V.:604; IV Piggyback:1270.3] Out: 720 [Urine:720] Intake/Output this shift: Total I/O In: 914.3 [I.V.:363.9; IV Piggyback:550.4] Out: 350 [Urine:350] Nutritional status: No diet orders on file  Neurologic Exam: Mental Status: Patient does not respond to verbal stimuli.  Does not respond to deep sternal rub.  Does not follow commands.  No verbalizations noted.  Cranial Nerves: II: patient does not respond confrontation bilaterally, pupils right 3 mm, left 3 mm,and reactive bilaterally III,IV,VI: doll's response absent bilaterally.  V,VII: corneal reflex absent bilaterally  VIII: patient does not respond to verbal stimuli IX,X: gag reflex reduced, XI: trapezius strength unable to test bilaterally XII: tongue strength unable to test Motor: Moves all extremities spontaneously Sensory: Does not respond to noxious stimuli in any extremity.  Lab Results: Basic Metabolic Panel: Recent Labs  Lab 08/26/17 2201 08/27/17 1022 08/27/17 1446 08/28/17 0220 08/29/17 0420  NA 138 135  --  142 141  K 3.8 3.4*  --  3.4* 3.4*  CL 102 104  --  108 109  CO2 16* 18*  --  23 24  GLUCOSE 445* 506*  --  328* 163*  BUN 17 19  --  16 25*  CREATININE 1.34*  1.88*  --  1.30* 1.55*  CALCIUM 9.1 8.5*  --  8.4* 8.0*  MG  --   --  2.3 2.3 2.0  PHOS  --   --  2.8  --   --     Liver Function Tests: Recent Labs  Lab 08/27/17 1446 08/29/17 0420  AST 27 22  ALT 25 20  ALKPHOS 71 54  BILITOT 1.2 0.7  PROT 7.0 6.0*  ALBUMIN 3.7 3.0*   No results for input(s): LIPASE, AMYLASE in the last 168 hours. No results for input(s): AMMONIA in the last 168 hours.  CBC: Recent Labs  Lab 08/26/17 2201 08/28/17 0220 08/29/17 0420  WBC 11.3* 15.7* 10.9*  NEUTROABS 6.8* 13.5*  --   HGB 16.2 16.2 13.8  HCT 48.8 49.3 40.1  MCV 89.8 88.2 88.0  PLT 212 191 160    Cardiac Enzymes: Recent Labs  Lab 08/28/17 0220  TROPONINI 0.11*    Lipid Panel: Recent Labs  Lab 08/28/17 0220  TRIG 205*    CBG: Recent Labs  Lab 08/29/17 1239 08/29/17 1342 08/29/17 1446 08/29/17 1550 08/29/17 1654  GLUCAP 180* 164* 131* 136* 148*    Microbiology: Results for orders placed or performed during the hospital encounter of 08/26/17  MRSA PCR Screening     Status: None   Collection Time: 08/27/17  3:24 AM  Result Value Ref Range Status   MRSA  by PCR NEGATIVE NEGATIVE Final    Comment:        The GeneXpert MRSA Assay (FDA approved for NASAL specimens only), is one component of a comprehensive MRSA colonization surveillance program. It is not intended to diagnose MRSA infection nor to guide or monitor treatment for MRSA infections.     Coagulation Studies: Recent Labs    08/26/17 2199/12/18  LABPROT 13.1  INR 1.00    Imaging: Dg Chest 1 View  Result Date: 08/28/2017 CLINICAL DATA:  Endotracheal tube placement EXAM: CHEST 1 VIEW COMPARISON:  08/26/2017 FINDINGS: Endotracheal tube with tip measuring 5.1 cm above the carina. Shallow inspiration with linear atelectasis in the lung bases. Heart size and pulmonary vascularity are normal. No blunting of costophrenic angles. No pneumothorax. IMPRESSION: Endotracheal tube appears in satisfactory position.  Shallow inspiration with linear atelectasis in the lung bases. Electronically Signed   By: Lucienne Capers M.D.   On: 08/28/2017 02:26   Dg Abd 1 View  Result Date: 08/28/2017 CLINICAL DATA:  71 year old male with a history of nasogastric tube placement EXAM: ABDOMEN - 1 VIEW COMPARISON:  08/28/2017 FINDINGS: Limited plain film of the upper abdomen for placement of enteric tube. Metallic tip enteric tube projects over the base the right lung, likely within the airway. Defibrillator pads project over the upper abdomen. Unremarkable gas pattern of the upper abdomen. Cholecystectomy IMPRESSION: Plain film of the upper abdomen demonstrates metallic tip enteric tube with coaxial wire projecting over the base of the right lung. These results were called by telephone at the time of interpretation on 08/28/2017 at 9:46 am to the nurse caring for the patient, Ms Casey Burkitt, who verbally acknowledged these results, and reported that the enteric tube has already been removed. Electronically Signed   By: Corrie Mckusick D.O.   On: 08/28/2017 09:48   Mr Jeri Cos [place] Contrast  Result Date: 08/28/2017 CLINICAL DATA:  71 year old male found in the postictal type, and then witnessed seizure activity in the emergency department. EXAM: MRI HEAD WITHOUT AND WITH CONTRAST TECHNIQUE: Multiplanar, multiecho pulse sequences of the brain and surrounding structures were obtained without and with intravenous contrast. CONTRAST:  52m MULTIHANCE GADOBENATE DIMEGLUMINE 529 MG/ML IV SOLN COMPARISON:  Head CT without contrast 08/26/2017. FINDINGS: Brain: Ventricular prominence is felt to be ex vacuo in nature. No transependymal edema. No restricted diffusion to suggest acute infarction. No midline shift, mass effect, extra-axial collection or acute intracranial hemorrhage. Cervicomedullary junction and pituitary are within normal limits. The hippocampal formations appear symmetric and within normal limits. GPearline Cablesand white matter signal  is within normal limits for age throughout the brain. No cortical encephalomalacia or chronic cerebral blood products identified. There is asymmetric enhancing soft tissue in the left cavernous sinus which appears distinct from the pituitary (on series 12, image 18). The estimated lesion size is 18 x 16 x 15 mm (AP by transverse by CC). The right cavernous sinus has a more normal appearance. No associated abnormality of the left ICA siphon flow void. Meckel cave is uninvolved. No definite regional dural thickening or extension into the left middle cranial fossa. No other abnormal enhancement identified. No other intracranial mass lesion identified. Vascular: Major intracranial vascular flow voids are preserved with dominant appearing distal left vertebral artery and mild generalized intracranial artery dolichoectasia. Skull and upper cervical spine: Skull and skullbase bone marrow signal is preserved. Negative visualized cervical spine and spinal cord. Sinuses/Orbits: Orbits soft tissues appear normal. The left orbital apex is not clearly involved by  the left cavernous sinus lesion. New line trace paranasal sinus fluid or mucosal thickening. Other: Fluid layering in the pharynx due to intubation. Endotracheal tube visible in the right oral cavity. Left oral tongue swelling and edema (series 12 image 26. Bilateral nasal cavity mucosal edema and hyperenhancement. Visible internal auditory structures appear normal. Mastoids are clear. Scalp soft tissues appear negative. IMPRESSION: 1. 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 No definite extension outside of the left cavernous sinus. The left ICA siphon remains patent. 2. No other intracranial mass identified and otherwise no acute intracranial abnormality. 3. Intubated.  Likely posttraumatic edema in the left oral tongue. Electronically Signed   By: Genevie Ann M.D.   On: 08/28/2017 15:16   Dg Pelvis  Portable  Result Date: 08/28/2017 CLINICAL DATA:  Patient for MRI.  Clearance examination. EXAM: PORTABLE PELVIS 1-2 VIEWS COMPARISON:  None. FINDINGS: No radiopaque foreign body is identified. No acute abnormality is seen. There is some degenerative disease about the right hip. IMPRESSION: Negative for foreign body.  Patient may proceed to MRI. Electronically Signed   By: Inge Rise M.D.   On: 08/28/2017 08:41   Dg Chest Port 1 View  Result Date: 08/29/2017 CLINICAL DATA:  Respiratory failure EXAM: PORTABLE CHEST 1 VIEW COMPARISON:  Chest radiograph 08/28/2017 FINDINGS: Endotracheal tube tip is in unchanged position. Right IJ central venous catheter tip remains in the lower SVC. Shallow lung inflation without focal airspace consolidation or pulmonary edema. Mild bibasilar atelectasis. No pneumothorax or sizable pleural effusion. IMPRESSION: Unchanged support apparatus.  Bibasilar atelectasis. Electronically Signed   By: Ulyses Jarred M.D.   On: 08/29/2017 06:07   Dg Chest Port 1 View  Result Date: 08/28/2017 CLINICAL DATA:  Status post central line placement today. EXAM: PORTABLE CHEST 1 VIEW COMPARISON:  Single-view of the chest earlier today. FINDINGS: New right IJ catheter tip projects in the mid to lower superior vena cava. No pneumothorax. Endotracheal tube tip is just below the clavicular heads in good position. Basilar atelectasis is unchanged. IMPRESSION: Right IJ catheter tip projects in the mid to lower superior vena cava. ETT in good position. No change in basilar atelectasis. Electronically Signed   By: Inge Rise M.D.   On: 08/28/2017 13:35   Dg Abd Portable 1v  Result Date: 08/28/2017 CLINICAL DATA:  MRI clearance. EXAM: PORTABLE ABDOMEN - 1 VIEW COMPARISON:  None. FINDINGS: Defibrillator pad is seen in the epigastric region. Surgical clips in the right upper quadrant. No unexpected metallic foreign body. Mild gaseous distention of small bowel and colon. IMPRESSION: 1. No  unexpected metallic foreign body. 2. Mild gaseous distention of small bowel and colon can be seen with an ileus. Electronically Signed   By: Lorin Picket M.D.   On: 08/28/2017 08:41   Dg Addison Bailey G Tube Plc W/fl W/rad  Result Date: 08/28/2017 CLINICAL DATA:  Attempt at nasogastric tube placement. EXAM: NASO G TUBE PLACEMENT WITH FL AND WITH RAD CONTRAST:  None FLUOROSCOPY TIME:  Fluoroscopy Time:  3 minutes 6 seconds Radiation Exposure Index (if provided by the fluoroscopic device): 870 micro Gy per meters square Number of Acquired Spot Images: 1 screen save COMPARISON:  None FINDINGS: The patient is intubated and sedated. An initial attempt was made to place a standard nasogastric tube. However, this would coil in the patient's mouth as the tip was stopped at the level of the endotracheal tube cuff. Multiple attempts were made to  pass the tube into the proximal esophagus but these were unsuccessful. Attempts were then made to place a Dobhoff tube. The tip passed repeatedly into the proximal trachea and would pass the ET tube cuff and inter the right mainstem bronchus. The table position was raised approximately 15 degrees an additional attempt was made but this time the tube tip passed into the left mainstem bronchus. The study was then discontinued. IMPRESSION: Unsuccessful attempts to place a nasogastric and subsequently a Dobhoff feeding tube under fluoroscopic guidance. The patient tolerated the procedure reasonably well. Electronically Signed   By: David  Martinique M.D.   On: 08/28/2017 15:53    Medications:  I have reviewed the patient's current medications. Scheduled: . aspirin  81 mg Per Tube Daily  . bromocriptine  12.5 mg Per Tube Daily  . chlorhexidine gluconate (MEDLINE KIT)  15 mL Mouth Rinse BID  . enoxaparin (LOVENOX) injection  40 mg Subcutaneous Q24H  . feeding supplement (VITAL HIGH PROTEIN)  1,000 mL Per Tube Q24H  . free water  200 mL Per Tube Q8H  . mouth rinse  15 mL Mouth Rinse  10 times per day    Assessment/Plan: Patient unchanged.  Remains on Keppra.  MRI of the brain reviewed and shows a globular, enhancing left cavernous sinus mass.  No surrounding edema.    Recommendations: 1.  Agree with current management and attempts to wean off sedation.     LOS: 2 days   Alexis Goodell, MD Neurology [phone] 08/29/2017  5:25 PM

## 2017-08-29 NOTE — Progress Notes (Signed)
ANTIBIOTIC CONSULT NOTE - INITIAL  Pharmacy Consult for acyclovir Indication: virus  Allergies  Allergen Reactions  . Sulfa Antibiotics Hives    Patient Measurements: Height: 5\' 9"  (175.3 cm) Weight: 235 lb 14.3 oz (107 kg) IBW/kg (Calculated) : 70.7 Adjusted Body Weight: 84.1 kg  Vital Signs: Temp: 99.4 F (37.4 C) (11/22 0400) Temp Source: Oral (11/22 0400) BP: 94/60 (11/22 0600) Pulse Rate: 82 (11/22 0600) Intake/Output from previous day: 11/21 0701 - 11/22 0700 In: 1874.2 [I.V.:604; IV Piggyback:1270.3] Out: 720 [Urine:720] Intake/Output from this shift: No intake/output data recorded.  Labs: Recent Labs    08/26/17 2201 08/27/17 1022 08/28/17 0220 08/29/17 0420  WBC 11.3*  --  15.7* 10.9*  HGB 16.2  --  16.2 13.8  PLT 212  --  191 160  CREATININE 1.34* 1.88* 1.30* 1.55*   Estimated Creatinine Clearance: 52.7 mL/min (A) (by C-G formula based on SCr of 1.55 mg/dL (H)). No results for input(s): VANCOTROUGH, VANCOPEAK, VANCORANDOM, GENTTROUGH, GENTPEAK, GENTRANDOM, TOBRATROUGH, TOBRAPEAK, TOBRARND, AMIKACINPEAK, AMIKACINTROU, AMIKACIN in the last 72 hours.   Microbiology: Recent Results (from the past 720 hour(s))  MRSA PCR Screening     Status: None   Collection Time: 08/27/17  3:24 AM  Result Value Ref Range Status   MRSA by PCR NEGATIVE NEGATIVE Final    Comment:        The GeneXpert MRSA Assay (FDA approved for NASAL specimens only), is one component of a comprehensive MRSA colonization surveillance program. It is not intended to diagnose MRSA infection nor to guide or monitor treatment for MRSA infections.     Medical History: Past Medical History:  Diagnosis Date  . Brain tumor (benign) ([place])     Medications:  Infusions:  . acyclovir Stopped (08/29/17 0146)  . famotidine (PEPCID) IV Stopped (08/28/17 2325)  . insulin (NOVOLIN-R) infusion 2.6 Units/hr (08/29/17 0733)  . levETIRAcetam Stopped (08/28/17 2252)  . norepinephrine (LEVOPHED)  Adult infusion 2.987 mcg/min (08/29/17 0600)  . potassium chloride    . propofol (DIPRIVAN) infusion 35.029 mcg/kg/min (08/29/17 0600)   Assessment: 61 yom with respiratory distress, now intubated. Pharmacy consulted to dose acyclovir.  Goal of Therapy:  Resolve infection, prevent ADE  Plan:  Acyclovir 840 mg IV Q8H. Dose is 10 mg/kg using adjusted body weight due to BMI > 30. Frequency Q8H for CrCl > 50 mL/min.    Andrew Horton D, Pharm.D., BCPS Clinical Pharmacist 08/29/2017,8:22 AM

## 2017-08-29 NOTE — Consult Note (Signed)
PULMONARY / CRITICAL CARE MEDICINE   Name: Andrew Horton MRN: [id number] DOB: Jan 21, 1946    ADMISSION DATE:  08/26/2017 CONSULTATION DATE: 08/26/2017  REFERRING MD: Dr. Marcille Blanco   CHIEF COMPLAINT: Seizure Activity   PT PROFILE: 14 M with hx or prolactinoma, no prior seizure history, admitted with status epilepticus and acute encephalopathy. Intubated in ED. Extubated 11/20 AM. Persistent encephalopathy requiring re-intubation 11/21 early AM.   MAJOR EVENTS/TEST RESULTS: 11/19 admission via EMS> ED with status epilepticus. Intubated in ED. Treated with phenytoin, then Keprpa 11/19 CTH: No acute findings 11/20 Extubated. Persistent encephalopathy > at times agitated. Dexmedetomidine ordered.  11/20 Neurology consultation: serum prolactin ordered. MRI brain, EEG ordered. Keppra continued 11/20 EEG:  general background slowing.  This finding may be seen with a diffuse disturbance that is etiologically nonspecific, but may include a post-ictal state, among other possibilities.  No epileptiform activity was noted 11/21 early AM: re-intubated for bradypnea and persistent encephalopathy 11/21 Empiric acyclovir initiated 11/21 MRI brain 11/22 Patient on pressors and propofol.  INDWELLING DEVICES:: ETT 11/19 >> 11/20, 11/21 >>  R IJ CVL 11/21 >>   MICRO DATA: MRSA PCR 11/20 >> NEG   ANTIMICROBIALS:  Acyclovir 11/21 >>    SUBJECTIVE:  Patient seen and examined at bedside.  Patient continues to be sedated.  On vasopressors which are being titrated.  On propofol drip.  VITAL SIGNS: BP (!) 102/53   Pulse 67   Temp 99.1 F (37.3 C) (Axillary)   Resp 17   Ht 5\' 9"  (1.753 m)   Wt 235 lb 14.3 oz (107 kg)   SpO2 99%   BMI 34.84 kg/m   HEMODYNAMICS:    VENTILATOR SETTINGS: Vent Mode: PRVC FiO2 (%):  [28 %] 28 % Set Rate:  [16 bmp] 16 bmp Vt Set:  [550 mL] 550 mL PEEP:  [5 cmH20] 5 cmH20 Plateau Pressure:  [13 cmH20-21 cmH20] 13 cmH20  INTAKE / OUTPUT: I/O last 3  completed shifts: In: 2578.8 [I.V.:1308.6; IV Piggyback:1270.3] Out: 1460 [Urine:1460]  PHYSICAL EXAMINATION: General: Intubated, sedated  Neuro: PERRL, EOMI, moves all extremities to painful stimuli HEENT: NCAT, sclerae white Neck: no JVD Cardiovascular: reg, no M Lungs: few rhonchi, no wheezes Abdomen: NABS, soft Ext: warm, no edema    LABS:  BMET Recent Labs  Lab 08/27/17 1022 08/28/17 0220 08/29/17 0420  NA 135 142 141  K 3.4* 3.4* 3.4*  CL 104 108 109  CO2 18* 23 24  BUN 19 16 25*  CREATININE 1.88* 1.30* 1.55*  GLUCOSE 506* 328* 163*    Electrolytes Recent Labs  Lab 08/27/17 1022 08/27/17 1446 08/28/17 0220 08/29/17 0420  CALCIUM 8.5*  --  8.4* 8.0*  MG  --  2.3 2.3 2.0  PHOS  --  2.8  --   --     CBC Recent Labs  Lab 08/26/17 2201 08/28/17 0220 08/29/17 0420  WBC 11.3* 15.7* 10.9*  HGB 16.2 16.2 13.8  HCT 48.8 49.3 40.1  PLT 212 191 160    Coag's Recent Labs  Lab 08/26/17 2201  APTT 26  INR 1.00    Sepsis Markers Recent Labs  Lab 08/27/17 1022 08/28/17 0220 08/29/17 0420  PROCALCITON 0.16 0.13 0.49    ABG Recent Labs  Lab 08/27/17 0001 08/28/17 0300  PHART 7.33* 7.32*  PCO2ART 43 43  PO2ART 73* 151*    Liver Enzymes Recent Labs  Lab 08/27/17 1446 08/29/17 0420  AST 27 22  ALT 25 20  ALKPHOS 71 54  BILITOT 1.2 0.7  ALBUMIN 3.7 3.0*    Cardiac Enzymes Recent Labs  Lab 08/28/17 0220  TROPONINI 0.11*    Glucose Recent Labs  Lab 08/29/17 1142 08/29/17 1239 08/29/17 1342 08/29/17 1446 08/29/17 1550 08/29/17 1654  GLUCAP 197* 180* 164* 131* 136* 148*    CXR: No acute findings   ASSESSMENT / PLAN:  PULMONARY A: Acute ventilator dependent respiratory failure due to AMS P:   Cont full vent support - settings reviewed and/or adjusted Cont vent bundle Daily SBT if/when meets criteria  CARDIOVASCULAR A:  Hypotension - likely due to medications P:  Weaning vasopressors to maintain mean arterial  pressure greater than 65.  Trying to titrate off propofol drip.  RENAL A:   AKI, nonoliguric P:   Monitor urine output and electrolytes closely and replace as needed.  GASTROINTESTINAL A:   No acute issues  P:    SUP: IV famotidine TF protocol initiated 11/21  HEMATOLOGIC A:   No acute issues  P:  DVT px: SQ [place] Monitor CBC intermittently Transfuse per usual guidelines    INFECTIOUS A:   Possible viral encephalitis P:   Monitor temp, WBC count Micro and abx as above    ENDOCRINE A:   Hyperglycemia On known whether he has prior history of diabetes P:   Continue moderate scale SSI  NEUROLOGIC A:   Status epilepticus New onset seizures Acute encephalopathy ICU/ventilator associated discomfort P:   On propofol drip.  Will try and initiate Precedex and wean off propofol.  Monitor neuro status closely.  FAMILY  He has no family and no designated HCPOA  CCM time: 32 mins The above time includes time spent in consultation with patient and/or family members and reviewing care plan on multidisciplinary rounds  Dimas Chyle MD PCCM

## 2017-08-29 NOTE — Progress Notes (Signed)
Nurse turned patient with nurse tech, patient woke up and was agitated. Nurse called for help. Patient was trying to pull ETT and was physically violent, kicking and hitting nurses. Patient was on precedex at 0.9 mcg, 50 of fentanyl push was given. Elink called and updated on situation. Patient fell back asleep. Precedex titrated to 1.2 mcg and fentanyl order was change to 50-100 mcg prn. Will continue to assess and monitor patient.

## 2017-08-29 NOTE — Progress Notes (Signed)
[name]    MR#:  [id number]  DATE OF BIRTH:  10/10/1945  SUBJECTIVE:  Patient reintubated this morning due to unable to protect airways and agitation.  Currently sedated on the ventilator.   REVIEW OF SYSTEMS:   Review of Systems  Unable to perform ROS: Intubated    DRUG ALLERGIES:   Allergies  Allergen Reactions  . Sulfa Antibiotics Hives    VITALS:  Blood pressure (!) 71/47, pulse 84, temperature 98.7 F (37.1 C), temperature source Axillary, resp. rate 17, height 5\' 9"  (1.753 m), weight 107 kg (235 lb 14.3 oz), SpO2 98 %.  PHYSICAL EXAMINATION:   Physical Exam  GENERAL:  71 y.o.-year-old patient lying in the bed with no acute distress.  Critically illl EYES: Pupils equal, round, reactive to light and accommodation. No scleral icterus. Extraocular muscles intact.  HEENT: Head atraumatic, normocephalic. Oropharynx and nasopharynx clear.  Intubated and on the ventilator  nECK:  Supple, no jugular venous distention. No thyroid enlargement, no tenderness.  LUNGS: Normal breath sounds bilaterally, no wheezing, rales, rhonchi. No use of accessory muscles of respiration.  CARDIOVASCULAR: S1, S2 normal. No murmurs, rubs, or gallops.  ABDOMEN: Soft, nontender, nondistended.  No organomegaly or mass.  EXTREMITIES: No cyanosis, clubbing or edema b/l.    NEUROLOGIC: Intubated PSYCHIATRIC: On the vent SKIN: No obvious rash, lesion, or ulcer.   LABORATORY PANEL:  CBC Recent Labs  Lab 08/29/17 0420  WBC 10.9*  HGB 13.8  HCT 40.1  PLT 160    Chemistries  Recent Labs  Lab 08/29/17 0420  NA 141  K 3.4*  CL 109  CO2 24  GLUCOSE 163*  BUN 25*  CREATININE 1.55*  CALCIUM 8.0*  MG 2.0  AST 22  ALT 20  ALKPHOS 54  BILITOT 0.7   Cardiac Enzymes Recent Labs  Lab 08/28/17 0220  TROPONINI 0.11*   RADIOLOGY:  Dg Chest 1 View  Result Date: 08/28/2017 CLINICAL DATA:  Endotracheal  tube placement EXAM: CHEST 1 VIEW COMPARISON:  08/26/2017 FINDINGS: Endotracheal tube with tip measuring 5.1 cm above the carina. Shallow inspiration with linear atelectasis in the lung bases. Heart size and pulmonary vascularity are normal. No blunting of costophrenic angles. No pneumothorax. IMPRESSION: Endotracheal tube appears in satisfactory position. Shallow inspiration with linear atelectasis in the lung bases. Electronically Signed   By: Lucienne Capers M.D.   On: 08/28/2017 02:26   Dg Abd 1 View  Result Date: 08/28/2017 CLINICAL DATA:  71 year old male with a history of nasogastric tube placement EXAM: ABDOMEN - 1 VIEW COMPARISON:  08/28/2017 FINDINGS: Limited plain film of the upper abdomen for placement of enteric tube. Metallic tip enteric tube projects over the base the right lung, likely within the airway. Defibrillator pads project over the upper abdomen. Unremarkable gas pattern of the upper abdomen. Cholecystectomy IMPRESSION: Plain film of the upper abdomen demonstrates metallic tip enteric tube with coaxial wire projecting over the base of the right lung. These results were called by telephone at the time of interpretation on 08/28/2017 at 9:46 am to the nurse caring for the patient, Ms Casey Burkitt, who verbally acknowledged these results, and reported that the enteric tube has already been removed. Electronically Signed   By: Corrie Mckusick D.O.   On: 08/28/2017 09:48   Mr Jeri Cos BD Contrast  Result Date: 08/28/2017 CLINICAL DATA:  71 year old male found in the postictal type, and then witnessed seizure activity in  the emergency department. EXAM: MRI HEAD WITHOUT AND WITH CONTRAST TECHNIQUE: Multiplanar, multiecho pulse sequences of the brain and surrounding structures were obtained without and with intravenous contrast. CONTRAST:  48mL MULTIHANCE GADOBENATE DIMEGLUMINE 529 MG/ML IV SOLN COMPARISON:  Head CT without contrast 08/26/2017. FINDINGS: Brain: Ventricular prominence is felt  to be ex vacuo in nature. No transependymal edema. No restricted diffusion to suggest acute infarction. No midline shift, mass effect, extra-axial collection or acute intracranial hemorrhage. Cervicomedullary junction and pituitary are within normal limits. The hippocampal formations appear symmetric and within normal limits. Pearline Cables and white matter signal is within normal limits for age throughout the brain. No cortical encephalomalacia or chronic cerebral blood products identified. There is asymmetric enhancing soft tissue in the left cavernous sinus which appears distinct from the pituitary (on series 12, image 18). The estimated lesion size is 18 x 16 x 15 mm (AP by transverse by CC). The right cavernous sinus has a more normal appearance. No associated abnormality of the left ICA siphon flow void. Meckel cave is uninvolved. No definite regional dural thickening or extension into the left middle cranial fossa. No other abnormal enhancement identified. No other intracranial mass lesion identified. Vascular: Major intracranial vascular flow voids are preserved with dominant appearing distal left vertebral artery and mild generalized intracranial artery dolichoectasia. Skull and upper cervical spine: Skull and skullbase bone marrow signal is preserved. Negative visualized cervical spine and spinal cord. Sinuses/Orbits: Orbits soft tissues appear normal. The left orbital apex is not clearly involved by the left cavernous sinus lesion. New line trace paranasal sinus fluid or mucosal thickening. Other: Fluid layering in the pharynx due to intubation. Endotracheal tube visible in the right oral cavity. Left oral tongue swelling and edema (series 12 image 26. Bilateral nasal cavity mucosal edema and hyperenhancement. Visible internal auditory structures appear normal. Mastoids are clear. Scalp soft tissues appear negative. IMPRESSION: 1. 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 This is nonspecific  but meningioma is favored. No definite extension outside of the left cavernous sinus. The left ICA siphon remains patent. 2. No other intracranial mass identified and otherwise no acute intracranial abnormality. 3. Intubated.  Likely posttraumatic edema in the left oral tongue. Electronically Signed   By: Genevie Ann M.D.   On: 08/28/2017 15:16   Dg Pelvis Portable  Result Date: 08/28/2017 CLINICAL DATA:  Patient for MRI.  Clearance examination. EXAM: PORTABLE PELVIS 1-2 VIEWS COMPARISON:  None. FINDINGS: No radiopaque foreign body is identified. No acute abnormality is seen. There is some degenerative disease about the right hip. IMPRESSION: Negative for foreign body.  Patient may proceed to MRI. Electronically Signed   By: Inge Rise M.D.   On: 08/28/2017 08:41   Dg Chest Port 1 View  Result Date: 08/29/2017 CLINICAL DATA:  Respiratory failure EXAM: PORTABLE CHEST 1 VIEW COMPARISON:  Chest radiograph 08/28/2017 FINDINGS: Endotracheal tube tip is in unchanged position. Right IJ central venous catheter tip remains in the lower SVC. Shallow lung inflation without focal airspace consolidation or pulmonary edema. Mild bibasilar atelectasis. No pneumothorax or sizable pleural effusion. IMPRESSION: Unchanged support apparatus.  Bibasilar atelectasis. Electronically Signed   By: Ulyses Jarred M.D.   On: 08/29/2017 06:07   Dg Chest Port 1 View  Result Date: 08/28/2017 CLINICAL DATA:  Status post central line placement today. EXAM: PORTABLE CHEST 1 VIEW COMPARISON:  Single-view of the chest earlier today. FINDINGS: New right IJ catheter tip projects in the mid to lower superior vena  cava. No pneumothorax. Endotracheal tube tip is just below the clavicular heads in good position. Basilar atelectasis is unchanged. IMPRESSION: Right IJ catheter tip projects in the mid to lower superior vena cava. ETT in good position. No change in basilar atelectasis. Electronically Signed   By: Inge Rise M.D.   On:  08/28/2017 13:35   Dg Abd Portable 1v  Result Date: 08/28/2017 CLINICAL DATA:  MRI clearance. EXAM: PORTABLE ABDOMEN - 1 VIEW COMPARISON:  None. FINDINGS: Defibrillator pad is seen in the epigastric region. Surgical clips in the right upper quadrant. No unexpected metallic foreign body. Mild gaseous distention of small bowel and colon. IMPRESSION: 1. No unexpected metallic foreign body. 2. Mild gaseous distention of small bowel and colon can be seen with an ileus. Electronically Signed   By: Lorin Picket M.D.   On: 08/28/2017 08:41   Dg Addison Bailey G Tube Plc W/fl W/rad  Result Date: 08/28/2017 CLINICAL DATA:  Attempt at nasogastric tube placement. EXAM: NASO G TUBE PLACEMENT WITH FL AND WITH RAD CONTRAST:  None FLUOROSCOPY TIME:  Fluoroscopy Time:  3 minutes 6 seconds Radiation Exposure Index (if provided by the fluoroscopic device): 870 micro Gy per meters square Number of Acquired Spot Images: 1 screen save COMPARISON:  None FINDINGS: The patient is intubated and sedated. An initial attempt was made to place a standard nasogastric tube. However, this would coil in the patient's mouth as the tip was stopped at the level of the endotracheal tube cuff. Multiple attempts were made to pass the tube into the proximal esophagus but these were unsuccessful. Attempts were then made to place a Dobhoff tube. The tip passed repeatedly into the proximal trachea and would pass the ET tube cuff and inter the right mainstem bronchus. The table position was raised approximately 15 degrees an additional attempt was made but this time the tube tip passed into the left mainstem bronchus. The study was then discontinued. IMPRESSION: Unsuccessful attempts to place a nasogastric and subsequently a Dobhoff feeding tube under fluoroscopic guidance. The patient tolerated the procedure reasonably well. Electronically Signed   By: David  Martinique M.D.   On: 08/28/2017 15:53   ASSESSMENT AND PLAN:  71 year old male admitted for status  epilepticus. 1. Status epilepticus: New onset seizures.  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 This is nonspecific but meningioma is favored - EEG with general background slowing but no evidence of electrographic seizure activity - neurology consult appreciated. - continue IV Keppra  2. Acute respiratory failure: With hypoxemia; intubated for airway protection.  -Patient reintubated today   3. Acute kidney injury: Hydrate with intravenous fluid. Avoid nephrotoxic agents.  Repeat BMP in the morning  4. Hypertension: Continue current medications  5. Diabetes mellitus type 2: -Sliding scale insulin  6. Hyperlipidemia: Continue statin therapy  7. DVT prophylaxis: Heparin  Case discussed with Care Management/Social Worker. Management plans discussed with the patient, family and they are in agreement.  CODE STATUS: full  DVT Prophylaxis: Heparin  TOTAL TIME TAKING CARE OF THIS PATIENT: *30* minutes.  >50% time spent on counselling and coordination of care  POSSIBLE D/C IN several DAYS, DEPENDING ON CLINICAL CONDITION.  Note: This dictation was prepared with Dragon dictation along with smaller phrase technology. Any transcriptional errors that result from this process are unintentional.  Fritzi Mandes M.D on 08/29/2017 at 1:38 PM  Between 7am to 6pm - Pager - [phone]  After 6pm  go to [email or web address] - password [email or web address] [facility]  Office  [phone]  CC: Primary care physician; System, Pcp Not In

## 2017-08-29 NOTE — Progress Notes (Signed)
[place] for electrolytes   Labs: Sodium (mmol/L)  Date Value  08/29/2017 141   Potassium (mmol/L)  Date Value  08/29/2017 3.4 (L)   Magnesium (mg/dL)  Date Value  08/29/2017 2.0   Phosphorus (mg/dL)  Date Value  08/27/2017 2.8   Calcium (mg/dL)  Date Value  08/29/2017 8.0 (L)   Albumin (g/dL)  Date Value  08/29/2017 3.0 (L)    Estimated Creatinine Clearance: 52.7 mL/min (A) (by C-G formula based on SCr of 1.55 mg/dL (H)).  Assessment: 71 yo male admitted with seizure activity. Pharmacy has been consulted to manage electrolytes.   Goal of Therapy:  K = 3.5 - 5  Plan:  K = 3.4. Will replace with potassium IV 76mEq x3 doses. Will follow with AM labs. Pharmacy will continue to monitor and adjust as needed.   Larene Beach, PharmD  08/29/2017,8:21 AM

## 2017-08-30 ENCOUNTER — Inpatient Hospital Stay: Payer: Medicare Other

## 2017-08-30 LAB — GLUCOSE, CAPILLARY
Glucose-Capillary: 124 mg/dL — ABNORMAL HIGH (ref 65–99)
Glucose-Capillary: 126 mg/dL — ABNORMAL HIGH (ref 65–99)
Glucose-Capillary: 130 mg/dL — ABNORMAL HIGH (ref 65–99)
Glucose-Capillary: 134 mg/dL — ABNORMAL HIGH (ref 65–99)
Glucose-Capillary: 139 mg/dL — ABNORMAL HIGH (ref 65–99)
Glucose-Capillary: 141 mg/dL — ABNORMAL HIGH (ref 65–99)
Glucose-Capillary: 157 mg/dL — ABNORMAL HIGH (ref 65–99)
Glucose-Capillary: 158 mg/dL — ABNORMAL HIGH (ref 65–99)
Glucose-Capillary: 158 mg/dL — ABNORMAL HIGH (ref 65–99)
Glucose-Capillary: 160 mg/dL — ABNORMAL HIGH (ref 65–99)
Glucose-Capillary: 168 mg/dL — ABNORMAL HIGH (ref 65–99)
Glucose-Capillary: 170 mg/dL — ABNORMAL HIGH (ref 65–99)
Glucose-Capillary: 172 mg/dL — ABNORMAL HIGH (ref 65–99)
Glucose-Capillary: 175 mg/dL — ABNORMAL HIGH (ref 65–99)
Glucose-Capillary: 176 mg/dL — ABNORMAL HIGH (ref 65–99)
Glucose-Capillary: 185 mg/dL — ABNORMAL HIGH (ref 65–99)
Glucose-Capillary: 189 mg/dL — ABNORMAL HIGH (ref 65–99)
Glucose-Capillary: 196 mg/dL — ABNORMAL HIGH (ref 65–99)
Glucose-Capillary: 200 mg/dL — ABNORMAL HIGH (ref 65–99)
Glucose-Capillary: 214 mg/dL — ABNORMAL HIGH (ref 65–99)
Glucose-Capillary: 229 mg/dL — ABNORMAL HIGH (ref 65–99)

## 2017-08-30 LAB — TRIGLYCERIDES: Triglycerides: 277 mg/dL — ABNORMAL HIGH (ref <150)

## 2017-08-30 LAB — POTASSIUM: Potassium: 3.6 mmol/L (ref 3.5–5.1)

## 2017-08-30 LAB — AMMONIA: AMMONIA: 18 umol/L (ref 9–35)

## 2017-08-30 LAB — MAGNESIUM: MAGNESIUM: 2 mg/dL (ref 1.7–2.4)

## 2017-08-30 MED ORDER — SODIUM CHLORIDE 0.9% FLUSH
10.0000 mL | Freq: Two times a day (BID) | INTRAVENOUS | Status: DC
  Administered 2017-08-30 – 2017-08-31 (×2): 10 mL
  Administered 2017-08-31: 20 mL
  Administered 2017-09-01: 10 mL
  Administered 2017-09-01: 40 mL
  Administered 2017-09-02 – 2017-09-04 (×4): 10 mL
  Administered 2017-09-04: 30 mL

## 2017-08-30 MED ORDER — SODIUM CHLORIDE 0.9% FLUSH: 10.0000 mL | INTRAVENOUS | Status: DC | PRN

## 2017-08-30 MED ORDER — PROPOFOL 1000 MG/100ML IV EMUL
5.0000 ug/kg/min | INTRAVENOUS | Status: DC
  Administered 2017-08-30: 30 ug/kg/min via INTRAVENOUS
  Filled 2017-08-30: qty 100

## 2017-08-30 MED ORDER — ETOMIDATE 2 MG/ML IV SOLN
INTRAVENOUS | Status: AC
  Administered 2017-08-30: 20 mg
  Filled 2017-08-30: qty 10

## 2017-08-30 MED ORDER — FAMOTIDINE 20 MG PO TABS: 20.0000 mg | ORAL_TABLET | Freq: Two times a day (BID) | ORAL | Status: DC

## 2017-08-30 MED ORDER — FENTANYL 2500MCG IN NS 250ML (10MCG/ML) PREMIX INFUSION
0.0000 ug/h | INTRAVENOUS | Status: DC
  Administered 2017-08-30: 250 ug/h via INTRAVENOUS
  Administered 2017-08-30: 50 ug/h via INTRAVENOUS
  Administered 2017-08-31: 150 ug/h via INTRAVENOUS
  Administered 2017-08-31: 300 ug/h via INTRAVENOUS
  Administered 2017-09-01: 150 ug/h via INTRAVENOUS
  Filled 2017-08-30 (×5): qty 250

## 2017-08-30 MED ORDER — SODIUM CHLORIDE 0.9 % IV SOLN
1000.0000 mg | Freq: Two times a day (BID) | INTRAVENOUS | Status: DC
  Administered 2017-08-30 – 2017-09-01 (×4): 1000 mg via INTRAVENOUS
  Filled 2017-08-30 (×5): qty 10

## 2017-08-30 MED ORDER — INSULIN ASPART 100 UNIT/ML ~~LOC~~ SOLN
2.0000 [IU] | SUBCUTANEOUS | Status: DC
  Administered 2017-08-30 (×2): 4 [IU] via SUBCUTANEOUS
  Administered 2017-08-31: 6 [IU] via SUBCUTANEOUS
  Administered 2017-08-31: 4 [IU] via SUBCUTANEOUS
  Administered 2017-08-31: 6 [IU] via SUBCUTANEOUS
  Administered 2017-08-31 – 2017-09-01 (×6): 4 [IU] via SUBCUTANEOUS
  Administered 2017-09-01 (×4): 6 [IU] via SUBCUTANEOUS
  Filled 2017-08-30 (×15): qty 1

## 2017-08-30 MED ORDER — INSULIN GLARGINE 100 UNIT/ML ~~LOC~~ SOLN
10.0000 [IU] | SUBCUTANEOUS | Status: DC
  Administered 2017-08-30 – 2017-09-01 (×3): 10 [IU] via SUBCUTANEOUS
  Filled 2017-08-30 (×3): qty 0.1

## 2017-08-30 MED ORDER — SUCCINYLCHOLINE CHLORIDE 20 MG/ML IJ SOLN
INTRAMUSCULAR | Status: AC
  Administered 2017-08-30: 100 mg
  Filled 2017-08-30: qty 1

## 2017-08-30 MED ORDER — FAMOTIDINE IN NACL 20-0.9 MG/50ML-% IV SOLN
20.0000 mg | Freq: Two times a day (BID) | INTRAVENOUS | Status: DC
  Administered 2017-08-30 – 2017-09-02 (×7): 20 mg via INTRAVENOUS
  Filled 2017-08-30 (×7): qty 50

## 2017-08-30 MED ORDER — ENOXAPARIN SODIUM 40 MG/0.4ML ~~LOC~~ SOLN
40.0000 mg | SUBCUTANEOUS | Status: DC
  Administered 2017-08-31 – 2017-09-09 (×10): 40 mg via SUBCUTANEOUS
  Filled 2017-08-30 (×10): qty 0.4

## 2017-08-30 MED ORDER — MIDAZOLAM HCL 2 MG/2ML IJ SOLN
INTRAMUSCULAR | Status: AC
  Administered 2017-08-30: 4 mg
  Filled 2017-08-30: qty 4

## 2017-08-30 NOTE — Progress Notes (Addendum)
CONCERNING: IV to Oral Route Change Policy  Patient with oral or tube access. Will resume famotidine as previously ordered.   Simpson,Michael L, [facility] 08/30/2017 8:02 AM

## 2017-08-30 NOTE — Progress Notes (Signed)
Patient self extubated on precedex at 1.2 mcg. Nurse heard ventilator alarming and found ETT laying on bed. Nurse bagged patient and called for help. RT was called to bedside and place patient's on bipap while waiting on physician. Patient was reintubated for airway protection per Dr. Marcille Blanco. Dr. Marcille Blanco at bedside during reintubation. Orders obtain for propofol and wrist restraints. Patient now stable.

## 2017-08-30 NOTE — Progress Notes (Signed)
Titrated off levophed during morning. Transitioned off insulin infusion. Insulin infusion held for most of shift per glucostabilizer orders. Patient had severe agitation in morning interspersed with periods of calm. Transitioned off propofol in rounds due to triglyceride levels to fentanyl infusion. In afternoon before bath patient more alert and calm and hadn't had agitation for hours. Was able to follow commands during bath and stay calm so restraints discontinued. Bed alarm and patient safety mitts continued. Remained calm, not pulling on medical equipment, and able to communicate throughout rest of shift.

## 2017-08-30 NOTE — Progress Notes (Signed)
RT called to room for self extubation. Pt placed on BiPAP with adequate tidal volumes, oxygen saturation and RR. Pt became extremely lethargic, pt intubated at [place] for airway protection and secretions clearance.

## 2017-08-30 NOTE — Progress Notes (Signed)
ANTIBIOTIC CONSULT NOTE - INITIAL  Pharmacy Consult for acyclovir Indication: virus  Allergies  Allergen Reactions  . Sulfa Antibiotics Hives    Patient Measurements: Height: 5\' 9"  (175.3 cm) Weight: 238 lb 12.1 oz (108.3 kg) IBW/kg (Calculated) : 70.7 Adjusted Body Weight: 84.1 kg  Vital Signs: Temp: 99.5 F (37.5 C) (11/23 1800) Temp Source: Axillary (11/23 1800) BP: 129/56 (11/23 1800) Pulse Rate: 101 (11/23 1800) Intake/Output from previous day: 11/22 0701 - 11/23 0700 In: 1592.3 [I.V.:875.1; IV Piggyback:717.2] Out: 835 [Urine:835] Intake/Output from this shift: Total I/O In: 648 [I.V.:264.4; IV Piggyback:383.6] Out: 940 [Urine:940]  Labs: Recent Labs    08/28/17 0220 08/29/17 0420  WBC 15.7* 10.9*  HGB 16.2 13.8  PLT 191 160  CREATININE 1.30* 1.55*   Estimated Creatinine Clearance: 53 mL/min (A) (by C-G formula based on SCr of 1.55 mg/dL (H)). No results for input(s): VANCOTROUGH, VANCOPEAK, VANCORANDOM, GENTTROUGH, GENTPEAK, GENTRANDOM, TOBRATROUGH, TOBRAPEAK, TOBRARND, AMIKACINPEAK, AMIKACINTROU, AMIKACIN in the last 72 hours.   Microbiology: Recent Results (from the past 720 hour(s))  MRSA PCR Screening     Status: None   Collection Time: 08/27/17  3:24 AM  Result Value Ref Range Status   MRSA by PCR NEGATIVE NEGATIVE Final    Comment:        The GeneXpert MRSA Assay (FDA approved for NASAL specimens only), is one component of a comprehensive MRSA colonization surveillance program. It is not intended to diagnose MRSA infection nor to guide or monitor treatment for MRSA infections.     Medical History: Past Medical History:  Diagnosis Date  . Brain tumor (benign) ([place])     Medications:  Infusions:  . acyclovir Stopped (08/30/17 1719)  . famotidine (PEPCID) IV Stopped (08/30/17 1125)  . fentaNYL infusion INTRAVENOUS 250 mcg/hr (08/30/17 1321)  . levETIRAcetam     Assessment: Pharmacy consulted for acyclovir dosing for 71yo male ICU  patient admitted with status epilepticus and acute respiratory failure. 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 Per rounds 11/23 some concern for HSV lesions in groin area.   Goal of Therapy:  Resolve infection, prevent ADE  Plan:  Continue acyclovir 840mg  IV Q8hr.   Collis Thede L, Pharm.D., BCPS Clinical Pharmacist 08/30/2017,6:57 PM

## 2017-08-30 NOTE — Progress Notes (Signed)
[place] for electrolytes   Labs: Sodium (mmol/L)  Date Value  08/29/2017 141   Potassium (mmol/L)  Date Value  08/30/2017 3.6   Magnesium (mg/dL)  Date Value  08/30/2017 2.0   Phosphorus (mg/dL)  Date Value  08/27/2017 2.8   Calcium (mg/dL)  Date Value  08/29/2017 8.0 (L)   Albumin (g/dL)  Date Value  08/29/2017 3.0 (L)    Estimated Creatinine Clearance: 53 mL/min (A) (by C-G formula based on SCr of 1.55 mg/dL (H)).  Assessment: 71 yo male admitted with seizure activity. Pharmacy has been consulted to manage electrolytes.    Plan:  Patient currently without NG access. Will obtain BMP with labs on 11/24. Will continue to replace for goal potassium > 3.5 and goal magnesium > 2.   Pharmacy will continue to monitor and adjust per consult.    MLS 08/30/2017,6:51 PM

## 2017-08-30 NOTE — Progress Notes (Signed)
[facility] [place] NAME: Andrew Horton    MR#:  [id number]  DATE OF BIRTH:  14-Apr-1946  SUBJECTIVE:  Patient reintubated third time since he extubated himself.  He is currently sedated on the ventilator. Unable to Place Dobbhoff for nutrition. No family available.  REVIEW OF SYSTEMS:   Review of Systems  Unable to perform ROS: Intubated    DRUG ALLERGIES:   Allergies  Allergen Reactions  . Sulfa Antibiotics Hives    VITALS:  Blood pressure (!) 141/70, pulse (!) 112, temperature 100 F (37.8 C), temperature source Axillary, resp. rate 16, height 5\' 9"  (1.753 m), weight 108.3 kg (238 lb 12.1 oz), SpO2 98 %.  PHYSICAL EXAMINATION:   Physical Exam  GENERAL:  71 y.o.-year-old patient lying in the bed with no acute distress.  Critically illl EYES: Pupils equal, round, reactive to light and accommodation. No scleral icterus. Extraocular muscles intact.  HEENT: Head atraumatic, normocephalic. Oropharynx and nasopharynx clear.  Intubated and on the ventilator  nECK:  Supple, no jugular venous distention. No thyroid enlargement, no tenderness.  LUNGS: Normal breath sounds bilaterally, no wheezing, rales, rhonchi. No use of accessory muscles of respiration.  CARDIOVASCULAR: S1, S2 normal. No murmurs, rubs, or gallops.  ABDOMEN: Soft, nontender, nondistended.  No organomegaly or mass.  EXTREMITIES: No cyanosis, clubbing or edema b/l.    NEUROLOGIC: Intubated PSYCHIATRIC: On the vent SKIN: No obvious rash, lesion, or ulcer.   LABORATORY PANEL:  CBC Recent Labs  Lab 08/29/17 0420  WBC 10.9*  HGB 13.8  HCT 40.1  PLT 160    Chemistries  Recent Labs  Lab 08/29/17 0420 08/30/17 0451  NA 141  --   K 3.4* 3.6  CL 109  --   CO2 24  --   GLUCOSE 163*  --   BUN 25*  --   CREATININE 1.55*  --   CALCIUM 8.0*  --   MG 2.0 2.0  AST 22  --   ALT 20  --   ALKPHOS 54  --   BILITOT 0.7  --    Cardiac Enzymes Recent Labs   Lab 08/28/17 0220  TROPONINI 0.11*   RADIOLOGY:  Mr Jeri Cos Wo Contrast  Result Date: 08/28/2017 CLINICAL DATA:  71 year old male found in the postictal type, and then witnessed seizure activity in the emergency department. EXAM: MRI HEAD WITHOUT AND WITH CONTRAST TECHNIQUE: Multiplanar, multiecho pulse sequences of the brain and surrounding structures were obtained without and with intravenous contrast. CONTRAST:  42mL MULTIHANCE GADOBENATE DIMEGLUMINE 529 MG/ML IV SOLN COMPARISON:  Head CT without contrast 08/26/2017. FINDINGS: Brain: Ventricular prominence is felt to be ex vacuo in nature. No transependymal edema. No restricted diffusion to suggest acute infarction. No midline shift, mass effect, extra-axial collection or acute intracranial hemorrhage. Cervicomedullary junction and pituitary are within normal limits. The hippocampal formations appear symmetric and within normal limits. Pearline Cables and white matter signal is within normal limits for age throughout the brain. No cortical encephalomalacia or chronic cerebral blood products identified. There is asymmetric enhancing soft tissue in the left cavernous sinus which appears distinct from the pituitary (on series 12, image 18). The estimated lesion size is 18 x 16 x 15 mm (AP by transverse by CC). The right cavernous sinus has a more normal appearance. No associated abnormality of the left ICA siphon flow void. Meckel cave is uninvolved. No definite regional dural thickening or extension into the left middle cranial fossa. No other  abnormal enhancement identified. No other intracranial mass lesion identified. Vascular: Major intracranial vascular flow voids are preserved with dominant appearing distal left vertebral artery and mild generalized intracranial artery dolichoectasia. Skull and upper cervical spine: Skull and skullbase bone marrow signal is preserved. Negative visualized cervical spine and spinal cord. Sinuses/Orbits: Orbits soft tissues  appear normal. The left orbital apex is not clearly involved by the left cavernous sinus lesion. New line trace paranasal sinus fluid or mucosal thickening. Other: Fluid layering in the pharynx due to intubation. Endotracheal tube visible in the right oral cavity. Left oral tongue swelling and edema (series 12 image 26. Bilateral nasal cavity mucosal edema and hyperenhancement. Visible internal auditory structures appear normal. Mastoids are clear. Scalp soft tissues appear negative. IMPRESSION: 1. 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 No definite extension outside of the left cavernous sinus. The left ICA siphon remains patent. 2. No other intracranial mass identified and otherwise no acute intracranial abnormality. 3. Intubated.  Likely posttraumatic edema in the left oral tongue. Electronically Signed   By: Genevie Ann M.D.   On: 08/28/2017 15:16   Dg Chest Port 1 View  Result Date: 08/30/2017 CLINICAL DATA:  Shortness of breath. EXAM: PORTABLE CHEST 1 VIEW COMPARISON:  Radiograph yesterday at 0505 hour FINDINGS: Endotracheal tube 6.6 cm above the carina at the thoracic inlet. Right internal jugular central venous catheter tip in the mid SVC. Low lung volumes persist. Mild streaky bibasilar atelectasis. Unchanged heart size and mediastinal contours. No large pleural effusion or pneumothorax. Stable osseous structures. IMPRESSION: 1. No change from prior exam.  Mild bibasilar atelectasis. 2. Endotracheal tube and right internal jugular venous catheter remain in place. Electronically Signed   By: Jeb Levering M.D.   On: 08/30/2017 01:39   Dg Chest Port 1 View  Result Date: 08/29/2017 CLINICAL DATA:  Respiratory failure EXAM: PORTABLE CHEST 1 VIEW COMPARISON:  Chest radiograph 08/28/2017 FINDINGS: Endotracheal tube tip is in unchanged position. Right IJ central venous catheter tip remains in the lower SVC. Shallow lung inflation  without focal airspace consolidation or pulmonary edema. Mild bibasilar atelectasis. No pneumothorax or sizable pleural effusion. IMPRESSION: Unchanged support apparatus.  Bibasilar atelectasis. Electronically Signed   By: Ulyses Jarred M.D.   On: 08/29/2017 06:07   Dg Addison Bailey G Tube Plc W/fl W/rad  Result Date: 08/28/2017 CLINICAL DATA:  Attempt at nasogastric tube placement. EXAM: NASO G TUBE PLACEMENT WITH FL AND WITH RAD CONTRAST:  None FLUOROSCOPY TIME:  Fluoroscopy Time:  3 minutes 6 seconds Radiation Exposure Index (if provided by the fluoroscopic device): 870 micro Gy per meters square Number of Acquired Spot Images: 1 screen save COMPARISON:  None FINDINGS: The patient is intubated and sedated. An initial attempt was made to place a standard nasogastric tube. However, this would coil in the patient's mouth as the tip was stopped at the level of the endotracheal tube cuff. Multiple attempts were made to pass the tube into the proximal esophagus but these were unsuccessful. Attempts were then made to place a Dobhoff tube. The tip passed repeatedly into the proximal trachea and would pass the ET tube cuff and inter the right mainstem bronchus. The table position was raised approximately 15 degrees an additional attempt was made but this time the tube tip passed into the left mainstem bronchus. The study was then discontinued. IMPRESSION: Unsuccessful attempts to place a nasogastric and subsequently a Dobhoff feeding tube under fluoroscopic guidance.  The patient tolerated the procedure reasonably well. Electronically Signed   By: David  Martinique M.D.   On: 08/28/2017 15:53   ASSESSMENT AND PLAN:  71 year old male admitted for status epilepticus. 1. Status epilepticus: New onset seizures.  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 This is nonspecific but meningioma is favored - EEG with general background slowing but no  evidence of electrographic seizure activity - neurology consult appreciated. - continue IV Keppra  2. Acute respiratory failure: With hypoxemia; intubated for airway protection.  -Patient reintubated today x3  3. Acute kidney injury: Hydrate with intravenous fluid. Avoid nephrotoxic agents.    4. Hypertension: Continue current medications  5. Diabetes mellitus type 2: -Sliding scale insulin  6. Hyperlipidemia: Continue statin therapy  7. DVT prophylaxis: Heparin    Case discussed with Care Management/Social Worker. Management plans discussed with the patient, family and they are in agreement.  CODE STATUS: full  DVT Prophylaxis: Heparin  TOTAL TIME TAKING CARE OF THIS PATIENT: *25* minutes.  >50% time spent on counselling and coordination of care .  Note: This dictation was prepared with Dragon dictation along with smaller phrase technology. Any transcriptional errors that result from this process are unintentional.  Fritzi Mandes M.D on 08/30/2017 at 2:54 PM  Between 7am to 6pm - Pager - [phone]  After 6pm go to [email or web address] - password [email or web address] [facility]  Office  [phone]  CC: Primary care physician; System, Pcp Not In

## 2017-08-30 NOTE — Progress Notes (Signed)
Attempted to place dobhoff tube as was ordered to in interdisciplinary rounds. Around 30 cm marking encountered resistance and was unable to advance. Patient became severely agitated during this time. Calmed once removed dobhoff, no change in oxygen saturation. Informed Marda Stalker NP, who stated she would attempt to place later in shift with assistance of glidescope.

## 2017-08-31 LAB — BLOOD GAS, ARTERIAL
ACID-BASE DEFICIT: 1.5 mmol/L (ref 0.0–2.0)
ACID-BASE DEFICIT: 3.6 mmol/L — AB (ref 0.0–2.0)
ALLENS TEST (PASS/FAIL): POSITIVE — AB
Acid-base deficit: 2.7 mmol/L — ABNORMAL HIGH (ref 0.0–2.0)
Bicarbonate: 22.9 mmol/L (ref 20.0–28.0)
Bicarbonate: 23.1 mmol/L (ref 20.0–28.0)
Bicarbonate: 23.7 mmol/L (ref 20.0–28.0)
FIO2: 28
FIO2: 0.28
FIO2: 0.28
O2 SAT: 93.6 %
O2 Saturation: 94.5 %
O2 Saturation: 94.4 %
PATIENT TEMPERATURE: 37
PCO2 ART: 47 mmHg (ref 32.0–48.0)
PCO2 ART: 46 mmHg (ref 32.0–48.0)
PEEP/CPAP: 5 cmH2O
PEEP: 5 cmH2O
PH ART: 7.4 (ref 7.350–7.450)
PH ART: 7.3 — AB (ref 7.350–7.450)
PH ART: 7.32 — AB (ref 7.350–7.450)
PO2 ART: 75 mmHg — AB (ref 83.0–108.0)
Patient temperature: 37
Patient temperature: 37
Pressure support: 7 cmH2O
RATE: 16 resp/min
VT: 550 mL
pCO2 arterial: 37 mmHg (ref 32.0–48.0)
pO2, Arterial: 73 mmHg — ABNORMAL LOW (ref 83.0–108.0)
pO2, Arterial: 80 mmHg — ABNORMAL LOW (ref 83.0–108.0)

## 2017-08-31 LAB — BASIC METABOLIC PANEL
ANION GAP: 7 (ref 5–15)
BUN: 16 mg/dL (ref 6–20)
CO2: 24 mmol/L (ref 22–32)
Calcium: 7.9 mg/dL — ABNORMAL LOW (ref 8.9–10.3)
Chloride: 108 mmol/L (ref 101–111)
Creatinine, Ser: 1.03 mg/dL (ref 0.61–1.24)
GFR calc Af Amer: >60 mL/min (ref >60)
GLUCOSE: 240 mg/dL — AB (ref 65–99)
POTASSIUM: 3.8 mmol/L (ref 3.5–5.1)
Sodium: 139 mmol/L (ref 135–145)

## 2017-08-31 LAB — GLUCOSE, CAPILLARY
Glucose-Capillary: 177 mg/dL — ABNORMAL HIGH (ref 65–99)
Glucose-Capillary: 182 mg/dL — ABNORMAL HIGH (ref 65–99)
Glucose-Capillary: 192 mg/dL — ABNORMAL HIGH (ref 65–99)
Glucose-Capillary: 195 mg/dL — ABNORMAL HIGH (ref 65–99)
Glucose-Capillary: 196 mg/dL — ABNORMAL HIGH (ref 65–99)
Glucose-Capillary: 202 mg/dL — ABNORMAL HIGH (ref 65–99)
Glucose-Capillary: 212 mg/dL — ABNORMAL HIGH (ref 65–99)
Glucose-Capillary: 218 mg/dL — ABNORMAL HIGH (ref 65–99)
Glucose-Capillary: 222 mg/dL — ABNORMAL HIGH (ref 65–99)

## 2017-08-31 LAB — MAGNESIUM: MAGNESIUM: 1.9 mg/dL (ref 1.7–2.4)

## 2017-08-31 MED ORDER — MAGNESIUM SULFATE 2 GM/50ML IV SOLN
2.0000 g | Freq: Once | INTRAVENOUS | Status: AC
  Administered 2017-08-31: 2 g via INTRAVENOUS
  Filled 2017-08-31: qty 50

## 2017-08-31 NOTE — Progress Notes (Signed)
Subjective: Self extubated himself and re intubated as was not protecting his airway.   Objective: Current vital signs: BP (!) 187/79   Pulse (!) 118   Temp (!) 100.6 F (38.1 C) (Oral)   Resp (!) 24   Ht '5\' 9"'$  (1.753 m)   Wt 105.2 kg (231 lb 14.8 oz)   SpO2 100%   BMI 34.25 kg/m  Vital signs in last 24 hours: Temp:  [99.1 F (37.3 C)-102.1 F (38.9 C)] 100.6 F (38.1 C) (11/24 1150) Pulse Rate:  [96-118] 118 (11/24 1200) Resp:  [11-24] 24 (11/24 1200) BP: (114-187)/(54-80) 187/79 (11/24 1200) SpO2:  [95 %-100 %] 100 % (11/24 1200) FiO2 (%):  [28 %] 28 % (11/24 1200) Weight:  [105.2 kg (231 lb 14.8 oz)] 105.2 kg (231 lb 14.8 oz) (11/24 0415)  Intake/Output from previous day: 11/23 0701 - 11/24 0700 In: 1180.7 [I.V.:637.1; IV Piggyback:543.6] Out: 1640 [Urine:1640] Intake/Output this shift: Total I/O In: 70.3 [I.V.:70.3] Out: 515 [Urine:515] Nutritional status: No diet orders on file  Neurologic Exam: Mental Status: Patient does not respond to verbal stimuli.  Does not respond to deep sternal rub.  Does not follow commands.  No verbalizations noted.  Cranial Nerves: II: patient does not respond confrontation bilaterally, pupils right 3 mm, left 3 mm,and reactive bilaterally III,IV,VI: doll's response absent bilaterally.  V,VII: corneal reflex absent bilaterally  VIII: patient does not respond to verbal stimuli IX,X: gag reflex reduced, XI: trapezius strength unable to test bilaterally XII: tongue strength unable to test Motor: Moves all extremities spontaneously Sensory: Does not respond to noxious stimuli in any extremity.  Lab Results: Basic Metabolic Panel: Recent Labs  Lab 08/26/17 2201 08/27/17 1022 08/27/17 1446 08/28/17 0220 08/29/17 0420 08/30/17 0451 08/31/17 0422 08/31/17 0500  NA 138 135  --  142 141  --  139  --   K 3.8 3.4*  --  3.4* 3.4* 3.6 3.8  --   CL 102 104  --  108 109  --  108  --   CO2 16* 18*  --  23 24  --  24  --   GLUCOSE  445* 506*  --  328* 163*  --  240*  --   BUN 17 19  --  16 25*  --  16  --   CREATININE 1.34* 1.88*  --  1.30* 1.55*  --  1.03  --   CALCIUM 9.1 8.5*  --  8.4* 8.0*  --  7.9*  --   MG  --   --  2.3 2.3 2.0 2.0  --  1.9  PHOS  --   --  2.8  --   --   --   --   --     Liver Function Tests: Recent Labs  Lab 08/27/17 1446 08/29/17 0420  AST 27 22  ALT 25 20  ALKPHOS 71 54  BILITOT 1.2 0.7  PROT 7.0 6.0*  ALBUMIN 3.7 3.0*   No results for input(s): LIPASE, AMYLASE in the last 168 hours. Recent Labs  Lab 08/30/17 1118  AMMONIA 18    CBC: Recent Labs  Lab 08/26/17 2201 08/28/17 0220 08/29/17 0420  WBC 11.3* 15.7* 10.9*  NEUTROABS 6.8* 13.5*  --   HGB 16.2 16.2 13.8  HCT 48.8 49.3 40.1  MCV 89.8 88.2 88.0  PLT 212 191 160    Cardiac Enzymes: Recent Labs  Lab 08/28/17 0220  TROPONINI 0.11*    Lipid Panel: Recent Labs  Lab 08/28/17 0220  08/30/17 0119  TRIG 205* 277*    CBG: Recent Labs  Lab 08/31/17 0000 08/31/17 0404 08/31/17 0716 08/31/17 0845 08/31/17 1204  GLUCAP 177* 192* 196* 34* 182*    Microbiology: Results for orders placed or performed during the hospital encounter of 08/26/17  MRSA PCR Screening     Status: None   Collection Time: 08/27/17  3:24 AM  Result Value Ref Range Status   MRSA by PCR NEGATIVE NEGATIVE Final    Comment:        The GeneXpert MRSA Assay (FDA approved for NASAL specimens only), is one component of a comprehensive MRSA colonization surveillance program. It is not intended to diagnose MRSA infection nor to guide or monitor treatment for MRSA infections.     Coagulation Studies: No results for input(s): LABPROT, INR in the last 72 hours.  Imaging: Dg Chest Port 1 View  Result Date: 08/30/2017 CLINICAL DATA:  Shortness of breath. EXAM: PORTABLE CHEST 1 VIEW COMPARISON:  Radiograph yesterday at 0505 hour FINDINGS: Endotracheal tube 6.6 cm above the carina at the thoracic inlet. Right internal jugular  central venous catheter tip in the mid SVC. Low lung volumes persist. Mild streaky bibasilar atelectasis. Unchanged heart size and mediastinal contours. No large pleural effusion or pneumothorax. Stable osseous structures. IMPRESSION: 1. No change from prior exam.  Mild bibasilar atelectasis. 2. Endotracheal tube and right internal jugular venous catheter remain in place. Electronically Signed   By: Jeb Levering M.D.   On: 08/30/2017 01:39    Medications:  I have reviewed the patient's current medications. Scheduled: . aspirin  81 mg Per Tube Daily  . bromocriptine  12.5 mg Per Tube Daily  . chlorhexidine gluconate (MEDLINE KIT)  15 mL Mouth Rinse BID  . enoxaparin (LOVENOX) injection  40 mg Subcutaneous Q24H  . feeding supplement (VITAL HIGH PROTEIN)  1,000 mL Per Tube Q24H  . free water  200 mL Per Tube Q8H  . insulin aspart  2-6 Units Subcutaneous Q4H  . insulin glargine  10 Units Subcutaneous Q24H  . mouth rinse  15 mL Mouth Rinse 10 times per day  . sodium chloride flush  10-40 mL Intracatheter Q12H    Assessment/Plan: Patient unchanged.  Remains on Keppra.  MRI of the brain reviewed and shows a globular, enhancing left cavernous sinus mass.  No surrounding edema.  - On CPAP trials and following commands.  - Was re intubated overnight for not protecting airway in setting of agitation - Con't Keppra as waking up - Would consider precedex for some agitation prior to extubation

## 2017-08-31 NOTE — Therapy (Signed)
ABG results called to Dr. Lyndel Safe.  PH 7.32 CO2 46 pO2 75 on 2/5 28%.  Pt very agitated.  Air removed from ETT tube and no leak was noted.  MD order NOT to extubate due to airway edema.  RN turned sedation back on.

## 2017-08-31 NOTE — Consult Note (Signed)
PULMONARY / CRITICAL CARE MEDICINE   Name: Andrew Horton MRN: [id number] DOB: 11-01-1945    ADMISSION DATE:  08/26/2017 CONSULTATION DATE: 08/26/2017  REFERRING MD: Dr. Marcille Blanco   CHIEF COMPLAINT: Seizure Activity   PT PROFILE: 67 M with hx or prolactinoma, no prior seizure history, admitted with status epilepticus and acute encephalopathy. Intubated in ED. Extubated 11/20 AM. Persistent encephalopathy requiring re-intubation 11/21 early AM.   MAJOR EVENTS/TEST RESULTS: 11/19 admission via EMS> ED with status epilepticus. Intubated in ED. Treated with phenytoin, then Keprpa 11/19 CTH: No acute findings 11/20 Extubated. Persistent encephalopathy > at times agitated. Dexmedetomidine ordered.  11/20 Neurology consultation: serum prolactin ordered. MRI brain, EEG ordered. Keppra continued 11/20 EEG:  general background slowing.  This finding may be seen with a diffuse disturbance that is etiologically nonspecific, but may include a post-ictal state, among other possibilities.  No epileptiform activity was noted 11/21 early AM: re-intubated for bradypnea and persistent encephalopathy 11/21 Empiric acyclovir initiated 11/21 MRI brain 11/22 Patient on pressors and propofol. 11/24. Weaned off vasopressors and following commands.  INDWELLING DEVICES:: ETT 11/19 >> 11/20, 11/21 >>  R IJ CVL 11/21 >>   MICRO DATA: MRSA PCR 11/20 >> NEG   ANTIMICROBIALS:  Acyclovir 11/21 >>    SUBJECTIVE:  Patient seen and examined at bedside.  Patient more alert this morning and following commands.   VITAL SIGNS: BP (!) 187/79   Pulse (!) 118   Temp (!) 100.6 F (38.1 C) (Oral)   Resp (!) 24   Ht 5\' 9"  (1.753 m)   Wt 231 lb 14.8 oz (105.2 kg)   SpO2 96%   BMI 34.25 kg/m   HEMODYNAMICS:    VENTILATOR SETTINGS: Vent Mode: PSV;CPAP FiO2 (%):  [28 %] 28 % PEEP:  [5 cmH20] 5 cmH20 Pressure Support:  [2 cmH20-7 cmH20] 7 cmH20  INTAKE / OUTPUT: I/O last 3 completed shifts: In:  1800.4 [I.V.:1090; IV Piggyback:710.4] Out: 2090 [Urine:2090]  PHYSICAL EXAMINATION: General: Intubated, awake  Neuro: PERRL, EOMI, moves all extremities to verbal stimuli HEENT: NCAT, sclerae white Neck: no JVD Cardiovascular: reg, no M Lungs: few rhonchi, no wheezes Abdomen: NABS, soft Ext: warm, no edema    LABS:  BMET Recent Labs  Lab 08/28/17 0220 08/29/17 0420 08/30/17 0451 08/31/17 0422  NA 142 141  --  139  K 3.4* 3.4* 3.6 3.8  CL 108 109  --  108  CO2 23 24  --  24  BUN 16 25*  --  16  CREATININE 1.30* 1.55*  --  1.03  GLUCOSE 328* 163*  --  240*    Electrolytes Recent Labs  Lab 08/27/17 1446 08/28/17 0220 08/29/17 0420 08/30/17 0451 08/31/17 0422 08/31/17 0500  CALCIUM  --  8.4* 8.0*  --  7.9*  --   MG 2.3 2.3 2.0 2.0  --  1.9  PHOS 2.8  --   --   --   --   --     CBC Recent Labs  Lab 08/26/17 2201 08/28/17 0220 08/29/17 0420  WBC 11.3* 15.7* 10.9*  HGB 16.2 16.2 13.8  HCT 48.8 49.3 40.1  PLT 212 191 160    Coag's Recent Labs  Lab 08/26/17 2201  APTT 26  INR 1.00    Sepsis Markers Recent Labs  Lab 08/27/17 1022 08/28/17 0220 08/29/17 0420  PROCALCITON 0.16 0.13 0.49    ABG Recent Labs  Lab 08/30/17 1045 08/31/17 0453 08/31/17 1219  PHART 7.40 7.30* 7.32*  PCO2ART 37  47 46  PO2ART 73* 80* 75*    Liver Enzymes Recent Labs  Lab 08/27/17 1446 08/29/17 0420  AST 27 22  ALT 25 20  ALKPHOS 71 54  BILITOT 1.2 0.7  ALBUMIN 3.7 3.0*    Cardiac Enzymes Recent Labs  Lab 08/28/17 0220  TROPONINI 0.11*    Glucose Recent Labs  Lab 08/30/17 2013 08/31/17 0000 08/31/17 0404 08/31/17 0716 08/31/17 0845 08/31/17 1204  GLUCAP 172* 177* 192* 196* 202* 182*    CXR: No acute findings   ASSESSMENT / PLAN:  PULMONARY A: Acute ventilator dependent respiratory failure due to AMS P:   Cont full vent support - settings reviewed and/or adjusted Cont vent bundle SBT and extubate if passes with cuff  leak.  CARDIOVASCULAR A:  Hypotension - likely due to medications P:  Off vasopressors.   RENAL A:   AKI, nonoliguric P:   Improving. Continue to monitor urine output/creatinine closely and replete lytes as needed.   GASTROINTESTINAL A:   No acute issues  P:    SUP: IV famotidine TF protocol initiated 11/21  HEMATOLOGIC A:   No acute issues  P:  DVT px: SQ [place]aparin Monitor CBC intermittently Transfuse per usual guidelines    INFECTIOUS A:   Possible viral encephalitis P:   Monitor temp, WBC count Micro and abx as above    ENDOCRINE A:   Hyperglycemia On known whether he has prior history of diabetes P:   Continue moderate scale SSI  NEUROLOGIC A:   Status epilepticus New onset seizures Acute encephalopathy ICU/ventilator associated discomfort P:   Improved. Neuro following,.  Start TPN today.  FAMILY  He has no family and no designated HCPOA  CCM time: 32 mins The above time includes time spent in consultation with patient and/or family members and reviewing care plan on multidisciplinary rounds  Dimas Chyle MD PCCM

## 2017-08-31 NOTE — Progress Notes (Signed)
Spoke with Dr. Jimmy Footman requarding patient continuing to have a fever. Tylenol was given around 2130 and most recent fever was 101.9. Order given to start cooling blanket but no other medications at this time.

## 2017-08-31 NOTE — Progress Notes (Addendum)
[place] for electrolytes  Labs: Sodium (mmol/L)  Date Value  08/31/2017 139   Potassium (mmol/L)  Date Value  08/31/2017 3.8   Magnesium (mg/dL)  Date Value  08/30/2017 2.0   Phosphorus (mg/dL)  Date Value  08/27/2017 2.8   Calcium (mg/dL)  Date Value  08/31/2017 7.9 (L)   Albumin (g/dL)  Date Value  08/29/2017 3.0 (L)    Estimated Creatinine Clearance: 78.6 mL/min (by C-G formula based on SCr of 1.03 mg/dL).  Assessment: 71 yo male admitted with seizure activity. Pharmacy has been consulted to manage electrolytes.    Goal: K > 3.5 and Mg > 2.   Plan: Potassium is WNL. Will order add-on magnesium to AM labs.  If WNL will recheck electrolyes on 11/26 as they have been at goal for several days.  Lenis Noon, PharmD Clinical Pharmacist 08/31/2017,7:31 AM  Addendum: Mg = 1.9, will order magnesium 2 g IV once.   Lenis Noon, PharmD 08/31/17 9:40 AM

## 2017-08-31 NOTE — Progress Notes (Signed)
Patient unable to be extubated today (did not have cuff leak). Patient able to follow commands during shift. One moment of agitation when doing final tests for extubation but able to verbally calm patient until certain no extubation when sedation restarted. Temperature around 99-low 100s during whole of shift but never high enough for tylenol administration. Hydralazine prn given once for high blood pressure.

## 2017-08-31 NOTE — Progress Notes (Signed)
[facility] at [place] NAME: Marquize Seib    MR#:  [id number]  DATE OF BIRTH:  Feb 18, 1946  SUBJECTIVE:  Patient reintubated third time since he extubated himself.  He is currently sedated on the ventilator. Unable to Place Dobbhoff for nutrition. No family available.  REVIEW OF SYSTEMS:   Review of Systems  Unable to perform ROS: Intubated    DRUG ALLERGIES:   Allergies  Allergen Reactions  . Sulfa Antibiotics Hives    VITALS:  Blood pressure (!) 187/79, pulse (!) 118, temperature (!) 100.6 F (38.1 C), temperature source Oral, resp. rate (!) 24, height 5\' 9"  (1.753 m), weight 105.2 kg (231 lb 14.8 oz), SpO2 96 %.  PHYSICAL EXAMINATION:   Physical Exam  GENERAL:  71 y.o.-year-old patient lying in the bed with no acute distress.  Critically illl EYES: Pupils equal, round, reactive to light and accommodation. No scleral icterus. Extraocular muscles intact.  HEENT: Head atraumatic, normocephalic. Oropharynx and nasopharynx clear.  Intubated and on the ventilator  nECK:  Supple, no jugular venous distention. No thyroid enlargement, no tenderness.  LUNGS: Normal breath sounds bilaterally, no wheezing, rales, rhonchi. No use of accessory muscles of respiration.  CARDIOVASCULAR: S1, S2 normal. No murmurs, rubs, or gallops.  ABDOMEN: Soft, nontender, nondistended.  No organomegaly or mass.  EXTREMITIES: No cyanosis, clubbing or edema b/l.    NEUROLOGIC: Intubated PSYCHIATRIC: On the vent SKIN: No obvious rash, lesion, or ulcer.   LABORATORY PANEL:  CBC Recent Labs  Lab 08/29/17 0420  WBC 10.9*  HGB 13.8  HCT 40.1  PLT 160    Chemistries  Recent Labs  Lab 08/29/17 0420  08/31/17 0422 08/31/17 0500  NA 141  --  139  --   K 3.4*   < > 3.8  --   CL 109  --  108  --   CO2 24  --  24  --   GLUCOSE 163*  --  240*  --   BUN 25*  --  16  --   CREATININE 1.55*  --  1.03  --   CALCIUM 8.0*  --  7.9*  --   MG 2.0   < >   --  1.9  AST 22  --   --   --   ALT 20  --   --   --   ALKPHOS 54  --   --   --   BILITOT 0.7  --   --   --    < > = values in this interval not displayed.   Cardiac Enzymes Recent Labs  Lab 08/28/17 0220  TROPONINI 0.11*   RADIOLOGY:  Dg Chest Port 1 View  Result Date: 08/30/2017 CLINICAL DATA:  Shortness of breath. EXAM: PORTABLE CHEST 1 VIEW COMPARISON:  Radiograph yesterday at 0505 hour FINDINGS: Endotracheal tube 6.6 cm above the carina at the thoracic inlet. Right internal jugular central venous catheter tip in the mid SVC. Low lung volumes persist. Mild streaky bibasilar atelectasis. Unchanged heart size and mediastinal contours. No large pleural effusion or pneumothorax. Stable osseous structures. IMPRESSION: 1. No change from prior exam.  Mild bibasilar atelectasis. 2. Endotracheal tube and right internal jugular venous catheter remain in place. Electronically Signed   By: Jeb Levering M.D.   On: 08/30/2017 01:39   ASSESSMENT AND PLAN:  71 year old male admitted for status epilepticus. 1. Status epilepticus: New onset seizures.  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 This is nonspecific but meningioma is favored - EEG with general background slowing but no evidence of electrographic seizure activity - neurology consult appreciated. - continue IV Keppra  2. Acute respiratory failure: With hypoxemia; intubated for airway protection.  -Patient reintubated today x3  3. Acute kidney injury: Hydrate with intravenous fluid. Avoid nephrotoxic agents.    4. Hypertension: Continue current medications  5. Diabetes mellitus type 2: -Sliding scale insulin  6. Hyperlipidemia: Continue statin therapy  7. DVT prophylaxis: Heparin  8.  Nutrition Nursing staff has not been able to Place Dobbhoff or NG tube -ICU attending today is recommending TPN   Case discussed with Care  Management/Social Worker. Management plans discussed with the patient, family and they are in agreement.  CODE STATUS: full  DVT Prophylaxis: Heparin  TOTAL TIME TAKING CARE OF THIS PATIENT: *25* minutes.  >50% time spent on counselling and coordination of care .  Note: This dictation was prepared with Dragon dictation along with smaller phrase technology. Any transcriptional errors that result from this process are unintentional.  Fritzi Mandes M.D on 08/31/2017 at 1:41 PM  Between 7am to 6pm - Pager - [phone]  After 6pm go to [email or web address] - password [email or web address] [facility]  Office  [phone]  CC: Primary care physician; System, Pcp Not In

## 2017-09-01 LAB — GLUCOSE, CAPILLARY
Glucose-Capillary: 170 mg/dL — ABNORMAL HIGH (ref 65–99)
Glucose-Capillary: 176 mg/dL — ABNORMAL HIGH (ref 65–99)
Glucose-Capillary: 190 mg/dL — ABNORMAL HIGH (ref 65–99)
Glucose-Capillary: 213 mg/dL — ABNORMAL HIGH (ref 65–99)
Glucose-Capillary: 240 mg/dL — ABNORMAL HIGH (ref 65–99)
Glucose-Capillary: 273 mg/dL — ABNORMAL HIGH (ref 65–99)

## 2017-09-01 LAB — CBC
HEMATOCRIT: 41.4 % (ref 40.0–52.0)
Hemoglobin: 14.1 g/dL (ref 13.0–18.0)
MCH: 30.2 pg (ref 26.0–34.0)
MCHC: 34 g/dL (ref 32.0–36.0)
MCV: 88.9 fL (ref 80.0–100.0)
Platelets: 175 10*3/uL (ref 150–440)
RBC: 4.66 MIL/uL (ref 4.40–5.90)
RDW: 13 % (ref 11.5–14.5)
WBC: 9.1 10*3/uL (ref 3.8–10.6)

## 2017-09-01 LAB — PROCALCITONIN: Procalcitonin: 0.16 ng/mL

## 2017-09-01 MED ORDER — DEXMEDETOMIDINE HCL IN NACL 400 MCG/100ML IV SOLN
0.4000 ug/kg/h | INTRAVENOUS | Status: DC
  Administered 2017-09-01: 1.2 ug/kg/h via INTRAVENOUS
  Administered 2017-09-01: 1 ug/kg/h via INTRAVENOUS
  Administered 2017-09-01: 1.2 ug/kg/h via INTRAVENOUS
  Administered 2017-09-01: 1 ug/kg/h via INTRAVENOUS
  Administered 2017-09-02: 0.8 ug/kg/h via INTRAVENOUS
  Administered 2017-09-02: 0.5 ug/kg/h via INTRAVENOUS
  Administered 2017-09-02: 0.8 ug/kg/h via INTRAVENOUS
  Administered 2017-09-02 (×2): 1.2 ug/kg/h via INTRAVENOUS
  Administered 2017-09-03 (×2): 1 ug/kg/h via INTRAVENOUS
  Filled 2017-09-01 (×11): qty 100

## 2017-09-01 MED ORDER — CHLORHEXIDINE GLUCONATE 0.12 % MT SOLN
15.0000 mL | Freq: Two times a day (BID) | OROMUCOSAL | Status: DC
  Administered 2017-09-02 – 2017-09-09 (×12): 15 mL via OROMUCOSAL
  Filled 2017-09-01 (×9): qty 15

## 2017-09-01 MED ORDER — ORAL CARE MOUTH RINSE
15.0000 mL | Freq: Two times a day (BID) | OROMUCOSAL | Status: DC
  Administered 2017-09-02 – 2017-09-07 (×9): 15 mL via OROMUCOSAL

## 2017-09-01 MED ORDER — SODIUM CHLORIDE 0.9 % IV SOLN
750.0000 mg | Freq: Two times a day (BID) | INTRAVENOUS | Status: DC
  Administered 2017-09-01: 750 mg via INTRAVENOUS
  Filled 2017-09-01 (×3): qty 7.5

## 2017-09-01 MED ORDER — THIAMINE HCL 100 MG/ML IJ SOLN
INTRAVENOUS | Status: AC
  Administered 2017-09-01: 18:00:00 via INTRAVENOUS
  Filled 2017-09-01: qty 960

## 2017-09-01 NOTE — Progress Notes (Signed)
Temperature continues to be elevated at 101.2, ice packs placed under pt's arms and E-LINK notified. Discussed with Elzie Rings, the patient's continued low grade temps and last CBC was 11/22 with an elevated WBC of 10.9. Gretchen to discuss with [facility] MD.

## 2017-09-01 NOTE — Progress Notes (Signed)
Pt. Extubated by RRT, this RN at bedside at time of extubation. Will continue to monitor patient.

## 2017-09-01 NOTE — Therapy (Signed)
Pt extubated to room air.  No stridor noted.  Strong productive cough.  RA saturation 97%

## 2017-09-01 NOTE — Progress Notes (Signed)
Subjective: S/p extubation, lethargic but follows commands.   Objective: Current vital signs: BP (!) 147/69   Pulse 93   Temp 98.9 F (37.2 C) (Axillary)   Resp 15   Ht _0  (1.753 m)   Wt 103.1 kg (227 lb 4.7 oz)   SpO2 93%   BMI 33.57 kg/m  Vital signs in last 24 hours: Temp:  [98.9 F (37.2 C)-101.5 F (38.6 C)] 98.9 F (37.2 C) (11/25 1159) Pulse Rate:  [91-114] 93 (11/25 1159) Resp:  [9-19] 15 (11/25 1159) BP: (122-191)/(56-89) 147/69 (11/25 1100) SpO2:  [93 %-100 %] 93 % (11/25 1159) FiO2 (%):  [28 %] 28 % (11/25 0800) Weight:  [103.1 kg (227 lb 4.7 oz)] 103.1 kg (227 lb 4.7 oz) (11/25 0500)  Intake/Output from previous day: 11/24 0701 - 11/25 0700 In: 1208.2 [I.V.:330.8; IV Piggyback:877.4] Out: 2010 [Urine:2010] Intake/Output this shift: Total I/O In: 337.3 [I.V.:60.5; IV Piggyback:276.8] Out: 260 [Urine:260] Nutritional status: No diet orders on file  Neurologic Exam: Mental Status: Patient does not respond to verbal stimuli.  Does not respond to deep sternal rub.  Does not follow commands.  No verbalizations noted.  Cranial Nerves: II: patient does not respond confrontation bilaterally, pupils right 3 mm, left 3 mm,and reactive bilaterally III,IV,VI: doll's response absent bilaterally.  V,VII: corneal reflex absent bilaterally  VIII: patient does not respond to verbal stimuli IX,X: gag reflex reduced, XI: trapezius strength unable to test bilaterally XII: tongue strength unable to test Motor: Moves all extremities spontaneously Sensory: Does not respond to noxious stimuli in any extremity.  Lab Results: Basic Metabolic Panel: Recent Labs  Lab 08/26/17 2201 08/27/17 1022 08/27/17 1446 08/28/17 0220 08/29/17 0420 08/30/17 0451 08/31/17 0422 08/31/17 0500  NA 138 135  --  142 141  --  139  --   K 3.8 3.4*  --  3.4* 3.4* 3.6 3.8  --   CL 102 104  --  108 109  --  108  --   CO2 16* 18*  --  23 24  --  24  --   GLUCOSE 445* 506*  --  328* 163*   --  240*  --   BUN 17 19  --  16 25*  --  16  --   CREATININE 1.34* 1.88*  --  1.30* 1.55*  --  1.03  --   CALCIUM 9.1 8.5*  --  8.4* 8.0*  --  7.9*  --   MG  --   --  2.3 2.3 2.0 2.0  --  1.9  PHOS  --   --  2.8  --   --   --   --   --     Liver Function Tests: Recent Labs  Lab 08/27/17 1446 08/29/17 0420  AST 27 22  ALT 25 20  ALKPHOS 71 54  BILITOT 1.2 0.7  PROT 7.0 6.0*  ALBUMIN 3.7 3.0*   No results for input(s): LIPASE, AMYLASE in the last 168 hours. Recent Labs  Lab 08/30/17 1118  AMMONIA 18    CBC: Recent Labs  Lab 08/26/17 2201 08/28/17 0220 08/29/17 0420 09/01/17 0046  WBC 11.3* 15.7* 10.9* 9.1  NEUTROABS 6.8* 13.5*  --   --   HGB 16.2 16.2 13.8 14.1  HCT 48.8 49.3 40.1 41.4  MCV 89.8 88.2 88.0 88.9  PLT 212 191 160 175    Cardiac Enzymes: Recent Labs  Lab 08/28/17 0220  TROPONINI 0.11*    Lipid Panel: Recent Labs  Lab 08/28/17 0220 08/30/17 0119  TRIG 205* 277*    CBG: Recent Labs  Lab 08/31/17 2008 08/31/17 2344 09/01/17 0357 09/01/17 0719 09/01/17 1117  GLUCAP 195* 212* 213* 170* 176*    Microbiology: Results for orders placed or performed during the hospital encounter of 08/26/17  MRSA PCR Screening     Status: None   Collection Time: 08/27/17  3:24 AM  Result Value Ref Range Status   MRSA by PCR NEGATIVE NEGATIVE Final    Comment:        The GeneXpert MRSA Assay (FDA approved for NASAL specimens only), is one component of a comprehensive MRSA colonization surveillance program. It is not intended to diagnose MRSA infection nor to guide or monitor treatment for MRSA infections.   Culture, blood (Routine X 2) w Reflex to ID Panel     Status: None (Preliminary result)   Collection Time: 09/01/17 12:46 AM  Result Value Ref Range Status   Specimen Description BLOOD BLOOD LEFT HAND  Final   Special Requests   Final    BOTTLES DRAWN AEROBIC AND ANAEROBIC Blood Culture adequate volume   Culture NO GROWTH < 12 HOURS   Final   Report Status PENDING  Incomplete  Culture, blood (Routine X 2) w Reflex to ID Panel     Status: None (Preliminary result)   Collection Time: 09/01/17  1:00 AM  Result Value Ref Range Status   Specimen Description BLOOD RIGHT ANTECUBITAL  Final   Special Requests   Final    BOTTLES DRAWN AEROBIC AND ANAEROBIC Blood Culture adequate volume   Culture NO GROWTH < 12 HOURS  Final   Report Status PENDING  Incomplete    Coagulation Studies: No results for input(s): LABPROT, INR in the last 72 hours.  Imaging: No results found.  Medications:  I have reviewed the patient's current medications. Scheduled: . aspirin  81 mg Per Tube Daily  . bromocriptine  12.5 mg Per Tube Daily  . chlorhexidine gluconate (MEDLINE KIT)  15 mL Mouth Rinse BID  . enoxaparin (LOVENOX) injection  40 mg Subcutaneous Q24H  . insulin aspart  2-6 Units Subcutaneous Q4H  . insulin glargine  10 Units Subcutaneous Q24H  . mouth rinse  15 mL Mouth Rinse 10 times per day  . sodium chloride flush  10-40 mL Intracatheter Q12H    Assessment/Plan: Patient unchanged.  Remains on Keppra.  MRI of the brain reviewed and shows a globular, enhancing left cavernous sinus mass.  No surrounding edema.  - Started precedex for agitation  - Agitation, no seizures - Keppra decreased to 750 BID

## 2017-09-01 NOTE — Progress Notes (Signed)
[place] NOTE   Pharmacy Consult for TPN  Indication: unable to obtain enteral access   Patient Measurements: Height: 5\' 9"  (175.3 cm) Weight: 227 lb 4.7 oz (103.1 kg) IBW/kg (Calculated) : 70.7 TPN AdjBW (KG): 79.1 Body mass index is 33.57 kg/m. Usual Weight:   Assessment: Pharmacy to assist with the management of electrolytes and glucose in this 71 year old male with TPN.   GI:  Endo:  Insulin requirements in the past 24 hours:  Lytes: Renal: Pulm: Cards:  Hepatobil: Neuro: ID:  Best Practices: TPN Access TPN start date: 11/25    Current Nutrition:   Plan:  Clinimix E5/15 at 10ml/hr Hold 20% lipid emulsion for first 7 days for ICU patients per ASPEN guideline 10 MVI, 100 mg of thiamine,  And 1 ml of  trace elements in TPN  Monitor TPN labs in am    Leinaala Catanese D 09/01/2017,2:14 PM

## 2017-09-01 NOTE — Progress Notes (Signed)
[facility] at [place] NAME: Andrew Horton    MR#:  [id number]  DATE OF BIRTH:  December 25, 1945  SUBJECTIVE:  Patient reintubated third time since he extubated himself.  He is currently sedated on the ventilator.  No family available.  REVIEW OF SYSTEMS:   Review of Systems  Unable to perform ROS: Intubated    DRUG ALLERGIES:   Allergies  Allergen Reactions  . Sulfa Antibiotics Hives    VITALS:  Blood pressure (!) 182/84, pulse (!) 102, temperature 99.6 F (37.6 C), temperature source Oral, resp. rate 15, height 5\' 9"  (1.753 m), weight 103.1 kg (227 lb 4.7 oz), SpO2 100 %.  PHYSICAL EXAMINATION:   Physical Exam  GENERAL:  71 y.o.-year-old patient lying in the bed with no acute distress.  Critically illl EYES: Pupils equal, round, reactive to light and accommodation. No scleral icterus. Extraocular muscles intact.  HEENT: Head atraumatic, normocephalic. Oropharynx and nasopharynx clear.  Intubated and on the ventilator  nECK:  Supple, no jugular venous distention. No thyroid enlargement, no tenderness.  LUNGS: Normal breath sounds bilaterally, no wheezing, rales, rhonchi. No use of accessory muscles of respiration.  CARDIOVASCULAR: S1, S2 normal. No murmurs, rubs, or gallops.  ABDOMEN: Soft, nontender, nondistended.  No organomegaly or mass.  EXTREMITIES: No cyanosis, clubbing or edema b/l.    NEUROLOGIC: Intubated PSYCHIATRIC: On the vent SKIN: No obvious rash, lesion, or ulcer.   LABORATORY PANEL:  CBC Recent Labs  Lab 09/01/17 0046  WBC 9.1  HGB 14.1  HCT 41.4  PLT 175    Chemistries  Recent Labs  Lab 08/29/17 0420  08/31/17 0422 08/31/17 0500  NA 141  --  139  --   K 3.4*   < > 3.8  --   CL 109  --  108  --   CO2 24  --  24  --   GLUCOSE 163*  --  240*  --   BUN 25*  --  16  --   CREATININE 1.55*  --  1.03  --   CALCIUM 8.0*  --  7.9*  --   MG 2.0   < >  --  1.9  AST 22  --   --   --   ALT 20  --   --    --   ALKPHOS 54  --   --   --   BILITOT 0.7  --   --   --    < > = values in this interval not displayed.   Cardiac Enzymes Recent Labs  Lab 08/28/17 0220  TROPONINI 0.11*   RADIOLOGY:  No results found. ASSESSMENT AND PLAN:  71 year old male admitted for status epilepticus. 1. Status epilepticus: New onset seizures.  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 This is nonspecific but meningioma is favored - EEG with general background slowing but no evidence of electrographic seizure activity - neurology consult appreciated. - continue IV Keppra  2. Acute respiratory failure: With hypoxemia; intubated for airway protection.  -Patient reintubated today x3 -hopefully pt can be weaned and extubated  3. Acute kidney injury: Hydrate with intravenous fluid. Avoid nephrotoxic agents.    4. Hypertension: Continue current medications  5. Diabetes mellitus type 2: -Sliding scale insulin  6. Hyperlipidemia: Continue statin therapy  7. DVT prophylaxis: Heparin  Case discussed with Care Management/Social Worker. Management plans discussed with  the patient, family and they are in agreement.  CODE STATUS: full  DVT Prophylaxis: Heparin  TOTAL TIME TAKING CARE OF THIS PATIENT: *25* minutes.  >50% time spent on counselling and coordination of care .  Note: This dictation was prepared with Dragon dictation along with smaller phrase technology. Any transcriptional errors that result from this process are unintentional.  Fritzi Mandes M.D on 09/01/2017   Between 7am to 6pm - Pager - [phone]  After 6pm go to [email or web address] - password [email or web address] [facility]  Office  [phone]  CC: Primary care physician; System, Pcp Not In

## 2017-09-01 NOTE — Progress Notes (Signed)
MD Z from neurology rounded at bedside at the same time this RN and charge RN were having to hold patient down due to him being combative. MD Z ordered Precedex to be started. See new orders. Will continue to monitor patient.

## 2017-09-01 NOTE — Progress Notes (Signed)
Gretchen from [facility] called to notify that orders have been entered by Dr. Lamonte Sakai. See new orders.

## 2017-09-01 NOTE — Progress Notes (Signed)
[place] Progress Note Patient Name: Andrew Horton DOB: [birth year] MRN: [id number]   Date of Service  09/01/2017  HPI/Events of Note  Fever, no other focal findings at this time but at risk nosocomial infection  eICU Interventions  - check cx data, PCt, CBC     Intervention Category Major Interventions: Other:  Collene Gobble 09/01/2017, 12:16 AM

## 2017-09-01 NOTE — Progress Notes (Addendum)
Nutrition Follow-up  DOCUMENTATION CODES:   Obesity unspecified  INTERVENTION:  Recommend initiating Clinimix E 5/15 at 40 mL/hr. After 24 hours if electrolytes, CBGs, and triglycerides are within acceptable range can advance to goal of Clinimix E 5/15 at 83 mL/hr. Provides 1414 kcal, 100 grams of protein, 1992 mL fluid daily.  Withhold lipids for 7 days per 2016 ASPEN/SCCM guidelines.  When possible patient would benefit from enteral nutrition either by advancement of diet when safe or placement of small-bore NGT for tube feeds if diet unable to be advanced.  NUTRITION DIAGNOSIS:   Inadequate oral intake related to inability to eat as evidenced by NPO status(patient currently intubated).  Ongoing.  GOAL:   Provide needs based on ASPEN/SCCM guidelines  Progressing with initiation of TPN.  MONITOR:   Vent status, Labs, Weight trends, TF tolerance, I & O's  REASON FOR ASSESSMENT:   Consult, Ventilator New TPN/TNA  ASSESSMENT:   71 year old male with PMHx of benign brain tumor presents with new onset seizure after being found in postictal state in bathroom of a gas station who was emergently intubated late on 11/19 and then extubated AM of 11/20. Patient had persistent encephalopathy following extubation which required re-intubation early AM of 11/21.   -On 11/23 patient self extubated him self and required re-intubation as he was not protecting his airway. -Per chart patient was unable to extubate yesterday due to airway edema. -Patient extubated to room air this morning. Risk for re-intubation.  Still unable to obtain enteral access for initiation of tube feeds. It appears it was attempted again on 11/23.  Patient will not be safe for initiation of diet. Plan is for initiation of TPN. This is appropriate as patient is critically ill and tomorrow will be day 7 of no nutrition.   IV Access: right external jugular CVC triple lumen placed 08/28/2017; per chest x-ray 11/21 tip  projects in mid to lower SVC  Medications reviewed and include: Novolog 2-6 units Q4hrs, Lantus 10 units Q24 hrs, acyclovir, famotidine, Keppra.  Labs reviewed: CBG 170-213.  I/O: 2010 mL UOP yesterday (0.8 mL/kg/hr)  Weight trend: 103.1 kg on 11/25; -1.2 kg from 11/20  Diet Order:  No diet orders on file  EDUCATION NEEDS:   No education needs have been identified at this time  Skin:  Skin Assessment: Reviewed RN Assessment  Last BM:  08/26/2017  Height:   Ht Readings from Last 1 Encounters:  08/27/17 5\' 9"  (1.753 m)    Weight:   Wt Readings from Last 1 Encounters:  09/01/17 227 lb 4.7 oz (103.1 kg)    Ideal Body Weight:  72.7 kg  BMI:  Body mass index is 33.57 kg/m.  Estimated Nutritional Needs:   Kcal:  1960-2140 (MSJ x 1.1-1.2; MSJ=1781)  Protein:  100-120 grams (1-1.2 grams/kg)  Fluid:  1.8-2.2 L/day (25-30 mL/kg IBW)  Willey Blade, MS, RD, LDN Office: [phone] Pager: [phone] After Hours/Weekend Pager: [phone]

## 2017-09-01 NOTE — Progress Notes (Signed)
Patient's temperature elevated. PRN Tylenol PR given per order. Continue to monitor.

## 2017-09-02 LAB — COMPREHENSIVE METABOLIC PANEL
ALBUMIN: 2.5 g/dL — AB (ref 3.5–5.0)
ALT: 23 U/L (ref 17–63)
ANION GAP: 7 (ref 5–15)
AST: 22 U/L (ref 15–41)
Alkaline Phosphatase: 59 U/L (ref 38–126)
BUN: 16 mg/dL (ref 6–20)
CHLORIDE: 105 mmol/L (ref 101–111)
CO2: 26 mmol/L (ref 22–32)
Calcium: 8.2 mg/dL — ABNORMAL LOW (ref 8.9–10.3)
Creatinine, Ser: 0.79 mg/dL (ref 0.61–1.24)
GFR calc Af Amer: >60 mL/min (ref >60)
GFR calc non Af Amer: >60 mL/min (ref >60)
GLUCOSE: 283 mg/dL — AB (ref 65–99)
POTASSIUM: 3.6 mmol/L (ref 3.5–5.1)
Sodium: 138 mmol/L (ref 135–145)
TOTAL PROTEIN: 6 g/dL — AB (ref 6.5–8.1)
Total Bilirubin: 0.9 mg/dL (ref 0.3–1.2)

## 2017-09-02 LAB — BLOOD GAS, ARTERIAL
Acid-Base Excess: 4.1 mmol/L — ABNORMAL HIGH (ref 0.0–2.0)
Bicarbonate: 27.4 mmol/L (ref 20.0–28.0)
FIO2: 0.32
O2 Saturation: 95 %
PCO2 ART: 36 mmHg (ref 32.0–48.0)
Patient temperature: 37
pH, Arterial: 7.49 — ABNORMAL HIGH (ref 7.350–7.450)
pO2, Arterial: 69 mmHg — ABNORMAL LOW (ref 83.0–108.0)

## 2017-09-02 LAB — URINE CULTURE: CULTURE: NO GROWTH

## 2017-09-02 LAB — GLUCOSE, CAPILLARY
Glucose-Capillary: 195 mg/dL — ABNORMAL HIGH (ref 65–99)
Glucose-Capillary: 224 mg/dL — ABNORMAL HIGH (ref 65–99)
Glucose-Capillary: 224 mg/dL — ABNORMAL HIGH (ref 65–99)
Glucose-Capillary: 248 mg/dL — ABNORMAL HIGH (ref 65–99)
Glucose-Capillary: 277 mg/dL — ABNORMAL HIGH (ref 65–99)
Glucose-Capillary: 278 mg/dL — ABNORMAL HIGH (ref 65–99)

## 2017-09-02 LAB — CBC WITH DIFFERENTIAL/PLATELET
BASOS ABS: 0.1 10*3/uL (ref 0–0.1)
Basophils Relative: 1 %
EOS ABS: 0.2 10*3/uL (ref 0–0.7)
EOS PCT: 2 %
HCT: 36.5 % — ABNORMAL LOW (ref 40.0–52.0)
Hemoglobin: 12.7 g/dL — ABNORMAL LOW (ref 13.0–18.0)
LYMPHS PCT: 15 %
Lymphs Abs: 1.2 10*3/uL (ref 1.0–3.6)
MCH: 30.4 pg (ref 26.0–34.0)
MCHC: 34.7 g/dL (ref 32.0–36.0)
MCV: 87.6 fL (ref 80.0–100.0)
MONO ABS: 0.7 10*3/uL (ref 0.2–1.0)
Monocytes Relative: 8 %
Neutro Abs: 5.9 10*3/uL (ref 1.4–6.5)
Neutrophils Relative %: 74 %
PLATELETS: 168 10*3/uL (ref 150–440)
RBC: 4.17 MIL/uL — AB (ref 4.40–5.90)
RDW: 12.9 % (ref 11.5–14.5)
WBC: 8 10*3/uL (ref 3.8–10.6)

## 2017-09-02 LAB — TRIGLYCERIDES: TRIGLYCERIDES: 177 mg/dL — AB (ref <150)

## 2017-09-02 LAB — POTASSIUM: POTASSIUM: 3.2 mmol/L — AB (ref 3.5–5.1)

## 2017-09-02 LAB — PROCALCITONIN: PROCALCITONIN: 0.14 ng/mL

## 2017-09-02 LAB — PHOSPHORUS
Phosphorus: 1.5 mg/dL — ABNORMAL LOW (ref 2.5–4.6)
Phosphorus: 2 mg/dL — ABNORMAL LOW (ref 2.5–4.6)

## 2017-09-02 LAB — PREALBUMIN: PREALBUMIN: 8.8 mg/dL — AB (ref 18–38)

## 2017-09-02 LAB — MAGNESIUM: MAGNESIUM: 2 mg/dL (ref 1.7–2.4)

## 2017-09-02 MED ORDER — INSULIN GLARGINE 100 UNIT/ML ~~LOC~~ SOLN
15.0000 [IU] | SUBCUTANEOUS | Status: DC
  Administered 2017-09-02: 15 [IU] via SUBCUTANEOUS
  Filled 2017-09-02 (×2): qty 0.15

## 2017-09-02 MED ORDER — POTASSIUM PHOSPHATES 15 MMOLE/5ML IV SOLN
20.0000 mmol | Freq: Once | INTRAVENOUS | Status: AC
  Administered 2017-09-02: 20 mmol via INTRAVENOUS
  Filled 2017-09-02: qty 6.67

## 2017-09-02 MED ORDER — POTASSIUM PHOSPHATES 15 MMOLE/5ML IV SOLN
30.0000 mmol | Freq: Once | INTRAVENOUS | Status: AC
  Administered 2017-09-02: 30 mmol via INTRAVENOUS
  Filled 2017-09-02: qty 10

## 2017-09-02 MED ORDER — INSULIN ASPART 100 UNIT/ML ~~LOC~~ SOLN
0.0000 [IU] | SUBCUTANEOUS | Status: DC
  Administered 2017-09-02: 8 [IU] via SUBCUTANEOUS
  Administered 2017-09-02: 5 [IU] via SUBCUTANEOUS
  Administered 2017-09-02: 8 [IU] via SUBCUTANEOUS
  Administered 2017-09-02: 5 [IU] via SUBCUTANEOUS
  Administered 2017-09-02: 3 [IU] via SUBCUTANEOUS
  Administered 2017-09-02: 5 [IU] via SUBCUTANEOUS
  Administered 2017-09-03: 3 [IU] via SUBCUTANEOUS
  Administered 2017-09-03 (×3): 5 [IU] via SUBCUTANEOUS
  Filled 2017-09-02 (×10): qty 1

## 2017-09-02 MED ORDER — VALPROATE SODIUM 500 MG/5ML IV SOLN
500.0000 mg | Freq: Two times a day (BID) | INTRAVENOUS | Status: DC
  Administered 2017-09-02 – 2017-09-03 (×2): 500 mg via INTRAVENOUS
  Filled 2017-09-02 (×3): qty 5

## 2017-09-02 MED ORDER — VALPROATE SODIUM 500 MG/5ML IV SOLN
1000.0000 mg | Freq: Once | INTRAVENOUS | Status: AC
  Administered 2017-09-02: 1000 mg via INTRAVENOUS
  Filled 2017-09-02: qty 10

## 2017-09-02 MED ORDER — TRACE MINERALS CR-CU-MN-SE-ZN 10-1000-500-60 MCG/ML IV SOLN
INTRAVENOUS | Status: DC
  Filled 2017-09-02: qty 960

## 2017-09-02 MED ORDER — TRACE MINERALS CR-CU-MN-SE-ZN 10-1000-500-60 MCG/ML IV SOLN
INTRAVENOUS | Status: DC
  Administered 2017-09-02: 20:00:00 via INTRAVENOUS
  Filled 2017-09-02: qty 960

## 2017-09-02 NOTE — Progress Notes (Signed)
[facility] at [place] NAME: Andrew Horton    MR#:  [id number]  DATE OF BIRTH:  1946-03-11  SUBJECTIVE:  Patient now extubated.  Remains agitated.  Follows some basic commands. On IV Precedex drip REVIEW OF SYSTEMS:   Review of Systems  Unable to perform ROS: Mental acuity    DRUG ALLERGIES:   Allergies  Allergen Reactions  . Sulfa Antibiotics Hives    VITALS:  Blood pressure 138/61, pulse 94, temperature 98.1 F (36.7 C), temperature source Axillary, resp. rate (!) 29, height 5\' 9"  (1.753 m), weight 104 kg (229 lb 4.5 oz), SpO2 100 %.  PHYSICAL EXAMINATION:   Physical Exam  GENERAL:  71 y.o.-year-old patient lying in the bed with no acute distress.  Critically illl EYES: Pupils equal, round, reactive to light and accommodation. No scleral icterus. Extraocular muscles intact.  HEENT: Head atraumatic, normocephalic. Oropharynx and nasopharynx clear.nECK:  Supple, no jugular venous distention. No thyroid enlargement, no tenderness.  LUNGS: Normal breath sounds bilaterally, no wheezing, rales, rhonchi. No use of accessory muscles of respiration.  CARDIOVASCULAR: S1, S2 normal. No murmurs, rubs, or gallops.  ABDOMEN: Soft, nontender, nondistended.  No organomegaly or mass.  EXTREMITIES: No cyanosis, clubbing or edema b/l.    NEUROLOGIC: Opens eyes to verbal commands.  Moves all extremities spontaneously  pSYCHIATRIC: Unable to assess on Precedex drip SKIN: No obvious rash, lesion, or ulcer.   LABORATORY PANEL:  CBC Recent Labs  Lab 09/02/17 0505  WBC 8.0  HGB 12.7*  HCT 36.5*  PLT 168    Chemistries  Recent Labs  Lab 09/02/17 0505  NA 138  K 3.6  CL 105  CO2 26  GLUCOSE 283*  BUN 16  CREATININE 0.79  CALCIUM 8.2*  MG 2.0  AST 22  ALT 23  ALKPHOS 59  BILITOT 0.9   Cardiac Enzymes Recent Labs  Lab 08/28/17 0220  TROPONINI 0.11*   RADIOLOGY:  No results found. ASSESSMENT AND PLAN:  71 year old male  admitted for status epilepticus. 1. Status epilepticus: New onset seizures.  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 This is nonspecific but meningioma is favored - EEG with general background slowing but no evidence of electrographic seizure activity - neurology consult appreciated. - continue IV Keppra--- change to IV Depakote   2. Acute respiratory failure: With hypoxemia; intubated for airway protection.  --Patient now extubated  3. Acute kidney injury: Hydrate with intravenous fluid. Avoid nephrotoxic agents.    4. Hypertension: Continue current medications  5. Diabetes mellitus type 2: -Sliding scale insulin  6. Hyperlipidemia: Continue statin therapy  7. DVT prophylaxis: Heparin  Case discussed with Care Management/Social Worker. Management plans discussed with the patient, family and they are in agreement.  CODE STATUS: full  DVT Prophylaxis: Heparin  TOTAL TIME TAKING CARE OF THIS PATIENT: *25* minutes.  >50% time spent on counselling and coordination of care .  Note: This dictation was prepared with Dragon dictation along with smaller phrase technology. Any transcriptional errors that result from this process are unintentional.  Fritzi Mandes M.D on 09/02/2017   Between 7am to 6pm - Pager - [phone]  After 6pm go to [email or web address] - password [email or web address] [facility]  Office  [phone]  CC: Primary care physician; System, Pcp Not In

## 2017-09-02 NOTE — Progress Notes (Signed)
[place] NOTE   Pharmacy Consult for TPN  Indication: unable to obtain enteral access   Patient Measurements: Height: 5\' 9"  (175.3 cm) Weight: 229 lb 4.5 oz (104 kg) IBW/kg (Calculated) : 70.7 TPN AdjBW (KG): 79.1 Body mass index is 33.86 kg/m. Usual Weight:   Assessment: Pharmacy to assist with the management of electrolytes and glucose in this 71 year old male with TPN.   GI:  Endo:  Insulin requirements in the past 24 hours: 48 units Lytes: Sodium (mmol/L)  Date Value  09/02/2017 138   Potassium (mmol/L)  Date Value  09/02/2017 3.2 (L)   Magnesium (mg/dL)  Date Value  09/02/2017 2.0   Phosphorus (mg/dL)  Date Value  09/02/2017 2.0 (L)   Calcium (mg/dL)  Date Value  09/02/2017 8.2 (L)   Albumin (g/dL)  Date Value  09/02/2017 2.5 (L)    Renal: Pulm: Cards:  Hepatobil: Neuro: ID:  Best Practices: TPN Access TPN start date: 11/25    Current Nutrition: Clinimix E5/15 at 40 ml/hr  Plan:  Clinimix E5/15 at 55ml/hr Hold 20% lipid emulsion for first 7 days for ICU patients per ASPEN guideline 10 MVI, 100 mg of thiamine,  And 1 ml of  trace elements in TPN Lantus increased to 15 units daily. Will add an additional 15 units of insulin to TPN in an effort to better control glucose to enable upward titration of TPN. Kphos 30 mmol IV x 1 and recheck K and phos in PM Monitor TPN labs in am   11/26 1811: K+: 3.2 and Phos: 2.0. Will order KPhos 64mmol IV x 1 dose. Recheck electrolytes with am labs.   Pernell Dupre, PharmD, BCPS Clinical Pharmacist 09/02/2017 8:00 PM

## 2017-09-02 NOTE — Progress Notes (Signed)
[place] Progress Note Patient Name: Yaniel Limbaugh DOB: 1946/08/02 MRN: [id number]   Date of Service  09/02/2017  HPI/Events of Note  CBG high  eICU Interventions  SSI -mod scale     Intervention Category Intermediate Interventions: Hyperglycemia - evaluation and treatment  Rakesh V. Alva 09/02/2017, 3:52 AM

## 2017-09-02 NOTE — Consult Note (Signed)
PULMONARY / CRITICAL CARE MEDICINE   Name: Andrew Horton MRN: [id number] DOB: 03-25-1946    ADMISSION DATE:  08/26/2017 CONSULTATION DATE: 08/26/2017  REFERRING MD: Dr. Marcille Blanco   CHIEF COMPLAINT: Seizure Activity   PT PROFILE: 76 M with hx or prolactinoma, no prior seizure history, admitted with status epilepticus and acute encephalopathy. Intubated in ED. Extubated 11/20 AM. Persistent encephalopathy requiring re-intubation 11/21 early AM.   MAJOR EVENTS/TEST RESULTS: 11/19 admission via EMS> ED with status epilepticus. Intubated in ED. Treated with phenytoin, then Keprpa 11/19 CTH: No acute findings 11/20 Extubated. Persistent encephalopathy > at times agitated. Dexmedetomidine ordered.  11/20 Neurology consultation: serum prolactin ordered. MRI brain, EEG ordered. Keppra continued 11/20 EEG:  general background slowing.  This finding may be seen with a diffuse disturbance that is etiologically nonspecific, but may include a post-ictal state, among other possibilities.  No epileptiform activity was noted 11/21 early AM: re-intubated for bradypnea and persistent encephalopathy 11/21 Empiric acyclovir initiated 11/21 MRI brain 11/22 Patient on pressors and propofol. 11/24. Weaned off vasopressors and following commands. 11/25 Extubated  INDWELLING DEVICES:: ETT 11/19 >> 11/20, 11/21 >>  R IJ CVL 11/21 >>   MICRO DATA: MRSA PCR 11/20 >> NEG   ANTIMICROBIALS:  Acyclovir 11/21 >>    SUBJECTIVE:  Patient seen and examined at bedside.  Patient underwent breathing trial and extubated to nasal canula. Doing well post extubation  VITAL SIGNS: BP (!) 147/84 (BP Location: Right Arm)   Pulse 83   Temp 99.1 F (37.3 C) (Oral)   Resp (!) 28   Ht 5\' 9"  (1.753 m)   Wt 227 lb 4.7 oz (103.1 kg)   SpO2 98%   BMI 33.57 kg/m   HEMODYNAMICS:    VENTILATOR SETTINGS: Vent Mode: PSV;CPAP FiO2 (%):  [28 %] 28 % PEEP:  [5 cmH20] 5 cmH20 Pressure Support:  [7 cmH20] 7  cmH20  INTAKE / OUTPUT: I/O last 3 completed shifts: In: 1710.6 [I.V.:556.4; IV Piggyback:1154.2] Out: 2720 [Urine:2720]  PHYSICAL EXAMINATION: General: nad, awake  Neuro: PERRL, EOMI, moves all extremities to verbal stimuli HEENT: NCAT, sclerae white Neck: no JVD Cardiovascular: reg, no M Lungs: few rhonchi, no wheezes Abdomen: NABS, soft Ext: warm, no edema    LABS:  BMET Recent Labs  Lab 08/28/17 0220 08/29/17 0420 08/30/17 0451 08/31/17 0422  NA 142 141  --  139  K 3.4* 3.4* 3.6 3.8  CL 108 109  --  108  CO2 23 24  --  24  BUN 16 25*  --  16  CREATININE 1.30* 1.55*  --  1.03  GLUCOSE 328* 163*  --  240*    Electrolytes Recent Labs  Lab 08/27/17 1446 08/28/17 0220 08/29/17 0420 08/30/17 0451 08/31/17 0422 08/31/17 0500  CALCIUM  --  8.4* 8.0*  --  7.9*  --   MG 2.3 2.3 2.0 2.0  --  1.9  PHOS 2.8  --   --   --   --   --     CBC Recent Labs  Lab 08/28/17 0220 08/29/17 0420 09/01/17 0046  WBC 15.7* 10.9* 9.1  HGB 16.2 13.8 14.1  HCT 49.3 40.1 41.4  PLT 191 160 175    Coag's Recent Labs  Lab 08/26/17 2201  APTT 26  INR 1.00    Sepsis Markers Recent Labs  Lab 08/28/17 0220 08/29/17 0420 09/01/17 0046  PROCALCITON 0.13 0.49 0.16    ABG Recent Labs  Lab 08/30/17 1045 08/31/17 0453 08/31/17 1219  PHART 7.40 7.30* 7.32*  PCO2ART 37 47 46  PO2ART 73* 80* 75*    Liver Enzymes Recent Labs  Lab 08/27/17 1446 08/29/17 0420  AST 27 22  ALT 25 20  ALKPHOS 71 54  BILITOT 1.2 0.7  ALBUMIN 3.7 3.0*    Cardiac Enzymes Recent Labs  Lab 08/28/17 0220  TROPONINI 0.11*    Glucose Recent Labs  Lab 09/01/17 0719 09/01/17 1117 09/01/17 1607 09/01/17 1942 09/01/17 2353 09/02/17 0337  GLUCAP 170* 176* 190* 240* 273* 278*    CXR: No acute findings   ASSESSMENT / PLAN:  PULMONARY A: Acute ventilator dependent respiratory failure P:   Extubated to nasal canula. Continue BD. Will monitor closely.Marland Kitchen  CARDIOVASCULAR A:   Hypotension - likely due to medications P:  Off vasopressors.   RENAL A:   AKI, nonoliguric P:   Improving. Repeat labs in am. Continue to monitor urine output/creatinine closely and replete lytes as needed.   GASTROINTESTINAL A:   No acute issues  P:    SUP: IV famotidine   HEMATOLOGIC A:   No acute issues  P:  DVT px: SQ Lenoxaparin Monitor CBC intermittently Transfuse per usual guidelines    INFECTIOUS A:   Possible viral encephalitis P:   Monitor temp, WBC count Micro and abx as above    ENDOCRINE A:   Hyperglycemia On known whether he has prior history of diabetes P:   Continue moderate scale SSI  NEUROLOGIC A:   Status epilepticus New onset seizures Acute encephalopathy ICU/ventilator associated discomfort P:   Improved. Neuro following,.    CCM time: 32 mins The above time includes time spent in consultation with patient and/or family members and reviewing care plan on multidisciplinary rounds  Dimas Chyle MD PCCM

## 2017-09-02 NOTE — Progress Notes (Signed)
Nutrition Brief Note  Nutrition Intervention:  Discussed nutrition plan of care with pharmacy. Plan is to recheck phosphorus tonight and put insulin in TPN for tomorrow.  Recommend continuing Clinimix E 5/15 at 40 mLhr for another day. After 24 hours if electrolytes and CBGs are within acceptable range can advance to goal of Clinimix E 5/15 at 85 mL/hr. Provides 1414 kcal, 100 grams of protein, 1992 mL fluid daily.  Continue to withhold lipids for 7 days per 2016 ASPEN/SCCM guidelines (TPN started 11/25).  Assessment: IV Access: right external jugular CVC triple lumen placed 08/28/2017; per chest x-ray 11/21 tip projects in mid to lower SVC  TPN: Patient was started on Clinimix E 5/15 at 40 mL/hr yesterday with MVI, trace elements, thiamine 100 mg.  Medications reviewed and include: Novolog 0-15 units Q4hrs (received total of 33 units Novolog past 24 hrs between this order and a previous order; also received 10 units Lantus last night), acyclovir, Precedex gtt (dose coming down), famotidine 20 mg Q12hrs IV, potassium phosphate 30 mmol in D5 500 mL 30 mmol once IV, valproate.  Labs reviewed: CBG 190-278 past 24 hrs, Phosphorus 1.5, Triglycerides 177. Potassium and Magnesium WNL.  Willey Blade, MS, [place], LDN Office: [phone] Pager: [phone] After Hours/Weekend Pager: [phone]

## 2017-09-02 NOTE — Progress Notes (Signed)
Subjective: Patient extubated.  Remains agitated at times.  On Precedex.  Objective: Current vital signs: BP 136/79   Pulse 90   Temp 98.1 F (36.7 C) (Axillary)   Resp (!) 23   Ht 5\' 9"  (1.753 m)   Wt 104 kg (229 lb 4.5 oz)   SpO2 98%   BMI 33.86 kg/m  Vital signs in last 24 hours: Temp:  [98.1 F (36.7 C)-102.4 F (39.1 C)] 98.1 F (36.7 C) (11/26 0747) Pulse Rate:  [78-105] 90 (11/26 0900) Resp:  [15-30] 23 (11/26 0900) BP: (110-160)/(60-90) 136/79 (11/26 0900) SpO2:  [91 %-100 %] 98 % (11/26 0900) Weight:  [104 kg (229 lb 4.5 oz)] 104 kg (229 lb 4.5 oz) (11/26 0500)  Intake/Output from previous day: 11/25 0701 - 11/26 0700 In: 1371 [I.V.:1094.2; IV Piggyback:276.8] Out: 1660 [Urine:1660] Intake/Output this shift: Total I/O In: 369 [I.V.:202; IV Piggyback:167] Out: 350 [Urine:350] Nutritional status: .TPN (CLINIMIX-E) Adult  Neurologic Exam: Mental Status: Awake and alert.  Speech markedly dysarthric.  Frustrated that I am not able to understand him.  Follows simple commands.   Cranial Nerves: II: Discs flat bilaterally; Blinks to bilateral confrontation, pupils equal, round, reactive to light and accommodation III,IV, VI: ptosis not present, extra-ocular motions intact bilaterally V,VII: smile symmetric, facial light touch sensation normal bilaterally VIII: hearing normal bilaterally IX,X: not tested XI: bilateral shoulder shrug XII: midline tongue extension Motor: Moves all extremities symmetrically against gravity.     Lab Results: Basic Metabolic Panel: Recent Labs  Lab 08/27/17 1022  08/27/17 1446 08/28/17 0220 08/29/17 0420 08/30/17 0451 08/31/17 0422 08/31/17 0500 09/02/17 0505  NA 135  --   --  142 141  --  139  --  138  K 3.4*  --   --  3.4* 3.4* 3.6 3.8  --  3.6  CL 104  --   --  108 109  --  108  --  105  CO2 18*  --   --  23 24  --  24  --  26  GLUCOSE 506*  --   --  328* 163*  --  240*  --  283*  BUN 19  --   --  16 25*  --  16  --   16  CREATININE 1.88*  --   --  1.30* 1.55*  --  1.03  --  0.79  CALCIUM 8.5*  --   --  8.4* 8.0*  --  7.9*  --  8.2*  MG  --    < > 2.3 2.3 2.0 2.0  --  1.9 2.0  PHOS  --   --  2.8  --   --   --   --   --  1.5*   < > = values in this interval not displayed.    Liver Function Tests: Recent Labs  Lab 08/27/17 1446 08/29/17 0420 09/02/17 0505  AST 27 22 22   ALT 25 20 23   ALKPHOS 71 54 59  BILITOT 1.2 0.7 0.9  PROT 7.0 6.0* 6.0*  ALBUMIN 3.7 3.0* 2.5*   No results for input(s): LIPASE, AMYLASE in the last 168 hours. Recent Labs  Lab 08/30/17 1118  AMMONIA 18    CBC: Recent Labs  Lab 08/26/17 2201 08/28/17 0220 08/29/17 0420 09/01/17 0046 09/02/17 0505  WBC 11.3* 15.7* 10.9* 9.1 8.0  NEUTROABS 6.8* 13.5*  --   --  5.9  HGB 16.2 16.2 13.8 14.1 12.7*  HCT 48.8 49.3 40.1 41.4 36.5*  MCV 89.8 88.2 88.0 88.9 87.6  PLT 212 191 160 175 168    Cardiac Enzymes: Recent Labs  Lab 08/28/17 0220  TROPONINI 0.11*    Lipid Panel: Recent Labs  Lab 08/28/17 0220 08/30/17 0119 09/02/17 0505  TRIG 205* 277* 177*    CBG: Recent Labs  Lab 09/01/17 1607 09/01/17 1942 09/01/17 2353 09/02/17 0337 09/02/17 0744  GLUCAP 190* 240* 273* 278* 224*    Microbiology: Results for orders placed or performed during the hospital encounter of 08/26/17  MRSA PCR Screening     Status: None   Collection Time: 08/27/17  3:24 AM  Result Value Ref Range Status   MRSA by PCR NEGATIVE NEGATIVE Final    Comment:        The GeneXpert MRSA Assay (FDA approved for NASAL specimens only), is one component of a comprehensive MRSA colonization surveillance program. It is not intended to diagnose MRSA infection nor to guide or monitor treatment for MRSA infections.   Culture, respiratory (NON-Expectorated)     Status: None (Preliminary result)   Collection Time: 09/01/17 12:30 AM  Result Value Ref Range Status   Specimen Description TRACHEAL ASPIRATE  Final   Special Requests NONE   Final   Gram Stain   Final    ABUNDANT WBC PRESENT, PREDOMINANTLY PMN RARE SQUAMOUS EPITHELIAL CELLS PRESENT ABUNDANT GRAM POSITIVE RODS FEW GRAM NEGATIVE RODS    Culture   Final    CULTURE REINCUBATED FOR BETTER GROWTH Performed at [facility], [place] [address], [place], [place] [address]    Report Status PENDING  Incomplete  Culture, blood (Routine X 2) w Reflex to ID Panel     Status: None (Preliminary result)   Collection Time: 09/01/17 12:46 AM  Result Value Ref Range Status   Specimen Description BLOOD BLOOD LEFT HAND  Final   Special Requests   Final    BOTTLES DRAWN AEROBIC AND ANAEROBIC Blood Culture adequate volume   Culture NO GROWTH 1 DAY  Final   Report Status PENDING  Incomplete  Culture, blood (Routine X 2) w Reflex to ID Panel     Status: None (Preliminary result)   Collection Time: 09/01/17  1:00 AM  Result Value Ref Range Status   Specimen Description BLOOD RIGHT ANTECUBITAL  Final   Special Requests   Final    BOTTLES DRAWN AEROBIC AND ANAEROBIC Blood Culture adequate volume   Culture NO GROWTH 1 DAY  Final   Report Status PENDING  Incomplete    Coagulation Studies: No results for input(s): LABPROT, INR in the last 72 hours.  Imaging: No results found.  Medications:  I have reviewed the patient's current medications. Scheduled: . aspirin  81 mg Per Tube Daily  . bromocriptine  12.5 mg Per Tube Daily  . chlorhexidine  15 mL Mouth Rinse BID  . enoxaparin (LOVENOX) injection  40 mg Subcutaneous Q24H  . insulin aspart  0-15 Units Subcutaneous Q4H  . insulin glargine  15 Units Subcutaneous Daily  . mouth rinse  15 mL Mouth Rinse q12n4p  . sodium chloride flush  10-40 mL Intracatheter Q12H    Assessment/Plan: Patient more awake but remains altered.  On Keppra.  Unclear if this may be affecting mental status.  Patient with underlying psychiatric disease as well.    Recommendations: 1.  D/C Keppra 2.  Start Depakote 1000mg  IV now with maintenance  of 500mg  BID 3.  Depakote level in AM 4. Continue seizure precautions  Case discussed with Dr.  Simonds   LOS: 6 days   Alexis Goodell, MD Neurology [phone] 09/02/2017  11:09 AM

## 2017-09-02 NOTE — Progress Notes (Signed)
[facility] NOTE   Pharmacy Consult for TPN  Indication: unable to obtain enteral access   Patient Measurements: Height: 5\' 9"  (175.3 cm) Weight: 229 lb 4.5 oz (104 kg) IBW/kg (Calculated) : 70.7 TPN AdjBW (KG): 79.1 Body mass index is 33.86 kg/m. Usual Weight:   Assessment: Pharmacy to assist with the management of electrolytes and glucose in this 71 year old male with TPN.   GI:  Endo:  Insulin requirements in the past 24 hours: 48 units Lytes: Sodium (mmol/L)  Date Value  09/02/2017 138   Potassium (mmol/L)  Date Value  09/02/2017 3.6   Magnesium (mg/dL)  Date Value  09/02/2017 2.0   Phosphorus (mg/dL)  Date Value  09/02/2017 1.5 (L)   Calcium (mg/dL)  Date Value  09/02/2017 8.2 (L)   Albumin (g/dL)  Date Value  09/02/2017 2.5 (L)    Renal: Pulm: Cards:  Hepatobil: Neuro: ID:  Best Practices: TPN Access TPN start date: 11/25    Current Nutrition: Clinimix E5/15 at 40 ml/hr  Plan:  Clinimix E5/15 at 30ml/hr Hold 20% lipid emulsion for first 7 days for ICU patients per ASPEN guideline 10 MVI, 100 mg of thiamine,  And 1 ml of  trace elements in TPN Lantus increased to 15 units daily. Will add an additional 15 units of insulin to TPN in an effort to better control glucose to enable upward titration of TPN. Kphos 30 mmol IV x 1 and recheck K and phos in PM Monitor TPN labs in am    Ulice Dash D 09/02/2017,4:11 PM

## 2017-09-02 NOTE — Consult Note (Addendum)
PULMONARY / CRITICAL CARE MEDICINE   Name: Andrew Horton MRN: [id number] DOB: May 31, 1946    ADMISSION DATE:  08/26/2017 CONSULTATION DATE: 08/26/2017  REFERRING MD: Dr. Marcille Blanco   CHIEF COMPLAINT: Seizure Activity   PT PROFILE: 68 M with hx or prolactinoma, no prior seizure history, admitted with status epilepticus and acute encephalopathy. Intubated in ED. Extubated 11/20 AM. Persistent encephalopathy requiring re-intubation 11/21 early AM.   MAJOR EVENTS/TEST RESULTS: 11/19 admission via EMS> ED with status epilepticus. Intubated in ED. Treated with phenytoin, then Keprpa 11/19 CTH: No acute findings 11/20 Extubated. Persistent encephalopathy > at times agitated. Dexmedetomidine ordered.  11/20 Neurology consultation: serum prolactin ordered. MRI brain, EEG ordered. Keppra continued 11/20 EEG:  general background slowing.  This finding may be seen with a diffuse disturbance that is etiologically nonspecific, but may include a post-ictal state, among other possibilities.  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 No definite extension outside of the left cavernous sinus  INDWELLING DEVICES:: ETT 11/19 >> 11/20, 11/21 >> 11/24 (self extubated) R IJ CVL 11/21 >>   MICRO DATA: MRSA PCR 11/20 >> NEG Blood 11/25 >>  Resp 11/25 >>  Urine 11/25 >>   ANTIMICROBIALS:  Acyclovir 11/21 >>     SUBJECTIVE:  Remains encephalopathic.  On dexmedetomidine infusion.  Able to follow commands and answer some questions.  VITAL SIGNS: BP (!) 156/89   Pulse 93   Temp 98.5 F (36.9 C) (Axillary)   Resp 20   Ht 5\' 9"  (1.753 m)   Wt 104 kg (229 lb 4.5 oz)   SpO2 100%   BMI 33.86 kg/m   HEMODYNAMICS:    VENTILATOR SETTINGS:    INTAKE / OUTPUT: I/O  last 3 completed shifts: In: 1863 [I.V.:1259.2; IV Piggyback:603.8] Out: 2655 [Urine:2655]  PHYSICAL EXAMINATION: General:RASS -1 to +1 Neuro: CNs intact, MAEs HEENT: NCAT, sclerae white Neck: no JVD Cardiovascular: reg, no M Lungs: few rhonchi, no wheezes Abdomen: NABS, soft Ext: warm, no edema    LABS:  BMET Recent Labs  Lab 08/29/17 0420 08/30/17 0451 08/31/17 0422 09/02/17 0505  NA 141  --  139 138  K 3.4* 3.6 3.8 3.6  CL 109  --  108 105  CO2 24  --  24 26  BUN 25*  --  16 16  CREATININE 1.55*  --  1.03 0.79  GLUCOSE 163*  --  240* 283*    Electrolytes Recent Labs  Lab 08/27/17 1446  08/29/17 0420 08/30/17 0451 08/31/17 0422 08/31/17 0500 09/02/17 0505  CALCIUM  --    < > 8.0*  --  7.9*  --  8.2*  MG 2.3   < > 2.0 2.0  --  1.9 2.0  PHOS 2.8  --   --   --   --   --  1.5*   < > = values in this interval not displayed.    CBC Recent Labs  Lab 08/29/17 0420 09/01/17 0046 09/02/17 0505  WBC 10.9* 9.1 8.0  HGB 13.8 14.1 12.7*  HCT 40.1 41.4 36.5*  PLT 160 175 168    Coag's Recent Labs  Lab 08/26/17 2201  APTT 26  INR 1.00    Sepsis Markers Recent Labs  Lab 08/29/17 0420 09/01/17 0046 09/02/17 0505  PROCALCITON 0.49 0.16 0.14  ABG Recent Labs  Lab 08/31/17 0453 08/31/17 1219 09/02/17 0500  PHART 7.30* 7.32* 7.49*  PCO2ART 47 46 36  PO2ART 80* 75* 69*    Liver Enzymes Recent Labs  Lab 08/27/17 1446 08/29/17 0420 09/02/17 0505  AST 27 22 22   ALT 25 20 23   ALKPHOS 71 54 59  BILITOT 1.2 0.7 0.9  ALBUMIN 3.7 3.0* 2.5*    Cardiac Enzymes Recent Labs  Lab 08/28/17 0220  TROPONINI 0.11*    Glucose Recent Labs  Lab 09/01/17 1942 09/01/17 2353 09/02/17 0337 09/02/17 0744 09/02/17 1159 09/02/17 1531  GLUCAP 240* 273* 278* 224* 248* 277*    CXR: No new film   ASSESSMENT / PLAN:  PULMONARY A: Acute ventilator dependent respiratory failure due to AMS  -Resolved P:   Supplemental oxygen to maintain SPO2  >90%  CARDIOVASCULAR A:  Hypotension, resolved P:  Monitor rhythm and BP  RENAL A:   AKI, nonoliguric -resolved P:   Monitor BMET intermittently Monitor I/Os Correct electrolytes as indicated   GASTROINTESTINAL A:   Dysphagia due to encephalopathy P:    SUP: N/I Continue TPN for now Consider SLP evaluation when cognition improves  HEMATOLOGIC A:   No acute issues  P:  DVT px: SQ Lenoxaparin Monitor CBC intermittently Transfuse per usual guidelines    INFECTIOUS A:   Possible encephalitis P:   Monitor temp, WBC count Micro and abx as above   Will discuss discontinuation of acyclovir with neurology 11/27  ENDOCRINE A:   Hyperglycemia Unknown whether he has prior history of diabetes P:   Continue Lantus and moderate scale SSI  NEUROLOGIC A:   Status epilepticus New onset seizures, controlled Acute encephalopathy -possibly due to Keppra P:   RASS goal: 0 Continue dexmedetomidine Cont PRN lorazepam for seizure Change Keppra to valproic acid -discussed with neurology  FAMILY  He has no family and no designated HCPOA   Merton Border, MD PCCM service Mobile [phone] Pager [phone] 09/02/2017 4:18 PM

## 2017-09-02 NOTE — Progress Notes (Signed)
Inpatient Diabetes Program Recommendations  AACE/ADA: New Consensus Statement on Inpatient Glycemic Control (2015)  Target Ranges:  Prepandial:   less than 140 mg/dL      Peak postprandial:   less than 180 mg/dL (1-2 hours)      Critically ill patients:  140 - 180 mg/dL   Results for BRAVEN, WOLK (MRN [id number]) as of 09/02/2017 10:04  Ref. Range 08/31/2017 23:44 09/01/2017 03:57 09/01/2017 07:19 09/01/2017 11:17 09/01/2017 16:07 09/01/2017 19:42  Glucose-Capillary Latest Ref Range: 65 - 99 mg/dL 212 (H) 213 (H) 170 (H) 176 (H) 190 (H) 240 (H)   Results for SKYLAN, LARA (MRN [id number]) as of 09/02/2017 10:04  Ref. Range 09/01/2017 23:53 09/02/2017 03:37 09/02/2017 07:44  Glucose-Capillary Latest Ref Range: 65 - 99 mg/dL 273 (H) 278 (H) 224 (H)    Home DM Meds: Tradjenta 5 mg daily       Metformin 1000 mg BID  Current Insulin Orders: Novolog Moderate Correction Scale/ SSI (0-15 units) Q4 hours      MD- Note patient was getting Lantus 10 units daily along with a more aggressive Novolog SSI.  Lantus has been stopped.  CBGs consistently >200 mg/dl since 8pm last night.  Note patient getting TPN 40cc/hr as well.  Please consider the following insulin adjustments:  Start back Lantus 15 units daily (0.15 units/kg dosing)       --Will follow patient during hospitalization--  Wyn Quaker RN, MSN, CDE Diabetes Coordinator Inpatient Glycemic Control Team Team Pager: [phone] (8a-5p)

## 2017-09-03 DIAGNOSIS — E118 Type 2 diabetes mellitus with unspecified complications: Secondary | ICD-10-CM

## 2017-09-03 DIAGNOSIS — R569 Unspecified convulsions: Secondary | ICD-10-CM

## 2017-09-03 LAB — CBC WITH DIFFERENTIAL/PLATELET
Basophils Absolute: 0.1 10*3/uL (ref 0–0.1)
Basophils Relative: 1 %
Eosinophils Absolute: 0.3 10*3/uL (ref 0–0.7)
Eosinophils Relative: 4 %
HEMATOCRIT: 36.2 % — AB (ref 40.0–52.0)
HEMOGLOBIN: 12.6 g/dL — AB (ref 13.0–18.0)
LYMPHS ABS: 1.6 10*3/uL (ref 1.0–3.6)
Lymphocytes Relative: 25 %
MCH: 30.3 pg (ref 26.0–34.0)
MCHC: 34.7 g/dL (ref 32.0–36.0)
MCV: 87.3 fL (ref 80.0–100.0)
MONO ABS: 0.5 10*3/uL (ref 0.2–1.0)
MONOS PCT: 8 %
NEUTROS ABS: 3.9 10*3/uL (ref 1.4–6.5)
NEUTROS PCT: 62 %
Platelets: 203 10*3/uL (ref 150–440)
RBC: 4.15 MIL/uL — ABNORMAL LOW (ref 4.40–5.90)
RDW: 12.7 % (ref 11.5–14.5)
WBC: 6.2 10*3/uL (ref 3.8–10.6)

## 2017-09-03 LAB — GLUCOSE, CAPILLARY
Glucose-Capillary: 205 mg/dL — ABNORMAL HIGH (ref 65–99)
Glucose-Capillary: 217 mg/dL — ABNORMAL HIGH (ref 65–99)
Glucose-Capillary: 207 mg/dL — ABNORMAL HIGH (ref 65–99)
Glucose-Capillary: 211 mg/dL — ABNORMAL HIGH (ref 65–99)
Glucose-Capillary: 198 mg/dL — ABNORMAL HIGH (ref 65–99)
Glucose-Capillary: 194 mg/dL — ABNORMAL HIGH (ref 65–99)

## 2017-09-03 LAB — COMPREHENSIVE METABOLIC PANEL
ALBUMIN: 2.4 g/dL — AB (ref 3.5–5.0)
ALT: 38 U/L (ref 17–63)
ANION GAP: 10 (ref 5–15)
AST: 40 U/L (ref 15–41)
Alkaline Phosphatase: 57 U/L (ref 38–126)
BUN: 17 mg/dL (ref 6–20)
CHLORIDE: 105 mmol/L (ref 101–111)
CO2: 25 mmol/L (ref 22–32)
CREATININE: 0.86 mg/dL (ref 0.61–1.24)
Calcium: 8.1 mg/dL — ABNORMAL LOW (ref 8.9–10.3)
GFR calc non Af Amer: >60 mL/min (ref >60)
GLUCOSE: 225 mg/dL — AB (ref 65–99)
Potassium: 3.3 mmol/L — ABNORMAL LOW (ref 3.5–5.1)
SODIUM: 140 mmol/L (ref 135–145)
Total Bilirubin: 0.4 mg/dL (ref 0.3–1.2)
Total Protein: 5.8 g/dL — ABNORMAL LOW (ref 6.5–8.1)

## 2017-09-03 LAB — CULTURE, RESPIRATORY W GRAM STAIN: Culture: NORMAL

## 2017-09-03 LAB — PHOSPHORUS: PHOSPHORUS: 3.1 mg/dL (ref 2.5–4.6)

## 2017-09-03 LAB — CULTURE, RESPIRATORY

## 2017-09-03 LAB — MAGNESIUM: MAGNESIUM: 2.1 mg/dL (ref 1.7–2.4)

## 2017-09-03 LAB — VALPROIC ACID LEVEL: Valproic Acid Lvl: 27 ug/mL — ABNORMAL LOW (ref 50.0–100.0)

## 2017-09-03 MED ORDER — INSULIN ASPART 100 UNIT/ML ~~LOC~~ SOLN
0.0000 [IU] | Freq: Every day | SUBCUTANEOUS | Status: DC
Start: 2017-09-03 — End: 2017-09-04

## 2017-09-03 MED ORDER — HYDRALAZINE HCL 20 MG/ML IJ SOLN
10.0000 mg | INTRAMUSCULAR | Status: DC | PRN
  Administered 2017-09-04: 10 mg via INTRAVENOUS
  Filled 2017-09-03: qty 1

## 2017-09-03 MED ORDER — POTASSIUM CHLORIDE 10 MEQ/50ML IV SOLN
10.0000 meq | INTRAVENOUS | Status: AC
  Administered 2017-09-03 (×2): 10 meq via INTRAVENOUS
  Filled 2017-09-03 (×2): qty 50

## 2017-09-03 MED ORDER — VALPROATE SODIUM 500 MG/5ML IV SOLN
750.0000 mg | Freq: Two times a day (BID) | INTRAVENOUS | Status: DC
  Administered 2017-09-03 – 2017-09-04 (×2): 750 mg via INTRAVENOUS
  Filled 2017-09-03 (×3): qty 7.5

## 2017-09-03 MED ORDER — INSULIN GLARGINE 100 UNIT/ML ~~LOC~~ SOLN
10.0000 [IU] | SUBCUTANEOUS | Status: DC
  Filled 2017-09-03: qty 0.1

## 2017-09-03 MED ORDER — INSULIN ASPART 100 UNIT/ML ~~LOC~~ SOLN
0.0000 [IU] | Freq: Three times a day (TID) | SUBCUTANEOUS | Status: DC
  Administered 2017-09-04: 8 [IU] via SUBCUTANEOUS
  Administered 2017-09-04: 11 [IU] via SUBCUTANEOUS
  Filled 2017-09-03 (×2): qty 1

## 2017-09-03 MED ORDER — POTASSIUM CHLORIDE 10 MEQ/50ML IV SOLN
10.0000 meq | INTRAVENOUS | Status: AC
  Administered 2017-09-03 (×4): 10 meq via INTRAVENOUS
  Filled 2017-09-03 (×4): qty 50

## 2017-09-03 MED ORDER — BROMOCRIPTINE MESYLATE 2.5 MG PO TABS
12.5000 mg | ORAL_TABLET | Freq: Every day | ORAL | Status: DC
  Administered 2017-09-04 – 2017-09-05 (×2): 12.5 mg via ORAL
  Filled 2017-09-03 (×4): qty 5

## 2017-09-03 NOTE — Consult Note (Signed)
PULMONARY / CRITICAL CARE MEDICINE   Name: Andrew Horton MRN: [id number] DOB: 11-Sep-1946    ADMISSION DATE:  08/26/2017 CONSULTATION DATE: 08/26/2017  REFERRING MD: Dr. Marcille Blanco   CHIEF COMPLAINT: Seizure Activity   PT PROFILE: 55 M with hx or prolactinoma, no prior seizure history, admitted with status epilepticus and acute encephalopathy. Intubated in ED. Extubated 11/20 AM. Persistent encephalopathy requiring re-intubation 11/21 early AM.   MAJOR EVENTS/TEST RESULTS: 11/19 admission via EMS> ED with status epilepticus. Intubated in ED. Treated with phenytoin, then Keprpa 11/19 CTH: No acute findings 11/20 Extubated. Persistent encephalopathy > at times agitated. Dexmedetomidine ordered.  11/20 Neurology consultation: serum prolactin ordered. MRI brain, EEG ordered. Keppra continued 11/20 EEG:  general background slowing.  This finding may be seen with a diffuse disturbance that is etiologically nonspecific, but may include a post-ictal state, among other possibilities.  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 No definite extension outside of the left cavernous sinus 11/24 Self extubated 11/25 Encephalopathic, tenuous respiratory status due to poor cough mechanics 11/26 Keppra changed to VPA 11/26 Encephalopathy much improved.  Dexmedetomidine discontinued.  Oriented to person and place. SLP eval and PT/OT ordered  INDWELLING DEVICES:: ETT 11/19 >> 11/20, 11/21 >> 11/24 (self extubated) R IJ CVL 11/21 >>   MICRO DATA: MRSA PCR 11/20 >> NEG Blood 11/25 >>  Resp 11/25 >> NOF Urine 11/25 >> NEG  ANTIMICROBIALS:  Acyclovir 11/21 >> 11/27    SUBJECTIVE:  Encephalopathy much improved.  No complaints of pain or dyspnea.  VITAL SIGNS: BP (!)  156/70   Pulse (!) 101   Temp 97.7 F (36.5 C) (Axillary)   Resp 15   Ht 5\' 9"  (1.753 m)   Wt 102.2 kg (225 lb 5 oz)   SpO2 93%   BMI 33.27 kg/m   HEMODYNAMICS:    VENTILATOR SETTINGS:    INTAKE / OUTPUT: I/O last 3 completed shifts: In: 3884.7 [I.V.:2252.4; IV Piggyback:1632.3] Out: 2536 [Urine:4350]  PHYSICAL EXAMINATION: General:RASS 0 Neuro: CNs intact, MAEs HEENT: NCAT, sclerae white Neck: no JVD Cardiovascular: reg, no M Lungs: Rattling cough, no wheezes Abdomen: NABS, soft Ext: warm, no edema    LABS:  BMET Recent Labs  Lab 08/31/17 0422 09/02/17 0505 09/02/17 1811 09/03/17 0403  NA 139 138  --  140  K 3.8 3.6 3.2* 3.3*  CL 108 105  --  105  CO2 24 26  --  25  BUN 16 16  --  17  CREATININE 1.03 0.79  --  0.86  GLUCOSE 240* 283*  --  225*    Electrolytes Recent Labs  Lab 08/31/17 0422 08/31/17 0500 09/02/17 0505 09/02/17 1811 09/03/17 0403  CALCIUM 7.9*  --  8.2*  --  8.1*  MG  --  1.9 2.0  --  2.1  PHOS  --   --  1.5* 2.0* 3.1    CBC Recent Labs  Lab 09/01/17 0046 09/02/17 0505 09/03/17 0403  WBC 9.1 8.0 6.2  HGB 14.1 12.7* 12.6*  HCT 41.4 36.5* 36.2*  PLT 175 168 203    Coag's No results for input(s): APTT, INR in the last 168 hours.  Sepsis Markers Recent Labs  Lab 08/29/17 0420 09/01/17 0046 09/02/17 0505  PROCALCITON 0.49 0.16 0.14    ABG Recent Labs  Lab  08/31/17 0453 08/31/17 1219 09/02/17 0500  PHART 7.30* 7.32* 7.49*  PCO2ART 47 46 36  PO2ART 80* 75* 69*    Liver Enzymes Recent Labs  Lab 08/29/17 0420 09/02/17 0505 09/03/17 0403  AST 22 22 40  ALT 20 23 38  ALKPHOS 54 59 57  BILITOT 0.7 0.9 0.4  ALBUMIN 3.0* 2.5* 2.4*    Cardiac Enzymes Recent Labs  Lab 08/28/17 0220  TROPONINI 0.11*    Glucose Recent Labs  Lab 09/02/17 1947 09/02/17 2330 09/03/17 0351 09/03/17 0748 09/03/17 1216 09/03/17 1232  GLUCAP 195* 224* 205* 217* 207* 211*    CXR: No new film   ASSESSMENT /  PLAN: Acute ventilator dependent respiratory failure due to AMS, resolved Hypotension, resolved AKI, nonoliguric, resolved Dysphagia due to encephalopathy Doubt encephalitis DM2 with Hyperglycemia Status epilepticus, resolved New onset seizures, controlled Acute encephalopathy, much improved Deconditioning     Supplemental oxygen to maintain SPO2 >90% Monitor rhythm and BP Monitor BMET intermittently Monitor I/Os Correct electrolytes as indicated  Continue TPN for now  SLP evaluation today Advance diet as able DVT px: SQ enoxaparin Monitor CBC intermittently Transfuse per usual guidelines   Micro and abx as above  - DC acyclovir Continue Lantus and moderate scale SSI  Change to ACHS if diet initiated DC dexmedetomidine Cont valproic acid  - change to PO when able PT/OT eval ordered Will likely need short term SNF/Rehab after dicharge  Watch in SDU today  Merton Border, MD PCCM service Mobile [phone] Pager [phone] 09/03/2017 1:53 PM

## 2017-09-03 NOTE — Progress Notes (Signed)
RN spoke with Dr. Mortimer Fries and made MD aware that patient states he is having thoughts of despair and suicide and that he has had these thoughts in the past. RN also made MD aware that patient has had elevation in blood pressure systolic in 932-671'I.  MD gave order for sitter, psych consult and PRN hydralazine order.

## 2017-09-03 NOTE — Progress Notes (Signed)
Inpatient Diabetes Program Recommendations  AACE/ADA: New Consensus Statement on Inpatient Glycemic Control (2015)  Target Ranges:  Prepandial:   less than 140 mg/dL      Peak postprandial:   less than 180 mg/dL (1-2 hours)      Critically ill patients:  140 - 180 mg/dL   Lab Results  Component Value Date   GLUCAP 217 (H) 09/03/2017   HGBA1C 8.4 (H) 08/26/2017    Review of Glycemic Control  Results for OZELL, JUHASZ (MRN [id number]) as of 09/03/2017 11:46  Ref. Range 09/02/2017 15:31 09/02/2017 19:47 09/02/2017 23:30 09/03/2017 03:51 09/03/2017 07:48  Glucose-Capillary Latest Ref Range: 65 - 99 mg/dL 277 (H) 195 (H) 224 (H) 205 (H) 217 (H)   Home DM Meds: Tradjenta 5 mg daily                             Metformin 1000 mg BID  Current Insulin Orders: Novolog Moderate Correction Scale/ SSI (0-15 units) Q4 hours, Lantus 15 units qday (last given at noon yesterday), Lantus 15 units in TPN (in TPN since 09/02/17)  Discussed my concerns regarding elevated CBG with Pharmacist Ulice Dash.  Patient will be having a swallow test today and based on the results of that test, we will either increase the Lantus in the TPN or increase Lantus SQ. Discussed with RN [place].  Diabetes Coordinators will continue to work closely with pharmacy to determine needs.   Gentry Fitz, RN, BA, MHA, CDE Diabetes Coordinator Inpatient Diabetes Program  [phone] (Team Pager) [phone] ([place]) 09/03/2017 11:57 AM

## 2017-09-03 NOTE — Progress Notes (Signed)
Rehab Admissions Coordinator Note:  Patient was screened by Cleatrice Burke for appropriateness for an Inpatient Acute Rehab admission per PT and OT recommendation. I will follow up tomorrow on assessment and recommendations. Cleatrice Burke 09/03/2017, 7:41 PM  I can be reached at [phone].

## 2017-09-03 NOTE — Progress Notes (Signed)
Patient alert to self and place.  Sitter at bedside and suicide precautions in room have been taken.

## 2017-09-03 NOTE — Progress Notes (Signed)
Verified the patient does have active Medicare Part A and B coverage through [facility] [phone]) and [employer].  Part A entitlement began on 08/09/11.     Patient also has an active [employer] prescription plan (policy: [id number]).  Verified through [facility] [phone] - rep: Miracle, reference number: [id number]).
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
## 2017-09-03 NOTE — Evaluation (Signed)
Physical Therapy Evaluation Patient Details Name: Andrew Horton MRN: [id number] DOB: 03-Mar-1946 Today's Date: 09/03/2017   History of Present Illness  71yo male with PMHx or prolactinoma, no prior seizure history, admitted with status epilepticus and acute encephalopathy. Intubated in ED on 11/19, extubated 11/20 AM. Persistent encephalopathy requiring re-intubation 11/21 early AM and self extubated on 11/24. 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 Seen for OT and PT evaluations on 11/27.   Clinical Impression   Pt presents to PT with weakness, decreased functional, mobility and inability to ambulate and would benefit from acute PT services to address objective findings.  Pt motivated to work with OT and PT and attempted all requested activities.  Pt impulsive once during sitting EOB but responded well to cues to wait.  Pt stood briefly and attempted side steps, but legs gave out and buckled causing pt to sit back down on bed.  Of note, while repositioning pt in bed, pt rolled to L side and could not tolerate position change due to onset of vertigo like symptoms (room spinning) and had L beating nystagmus for less than 1 min.  Pt overall requires Mod-Max A +2 for mobility this date for pt and therapist safety.    Follow Up Recommendations CIR    Equipment Recommendations  Other (comment)    Recommendations for Other Services Rehab consult     Precautions / Restrictions Precautions Precautions: Fall Restrictions Weight Bearing Restrictions: No      Mobility  Bed Mobility Overal bed mobility: Needs Assistance Bed Mobility: Supine to Sit;Sit to Supine;Rolling Rolling: Min assist(significant increase in vertigo with rolling to the L side with L beating nystagmus)   Supine to sit: Mod assist;+2 for physical assistance;HOB elevated Sit to supine: +2 for physical assistance;Mod assist    General bed mobility comments: Pt able to initiate movement and transfer needing assist at trunk and hips to rotate to EOB, initially poor balance with increased R lateral lean.  Pt with onset of vertigo with rolling to L side  Transfers Overall transfer level: Needs assistance Equipment used: Standard walker Transfers: Sit to/from Stand Sit to Stand: Max assist;+2 physical assistance         General transfer comment: mildly impulsive with initial transfer attempts but did follow commands well afterwards, waiting for therapy instruction  Ambulation/Gait Ambulation/Gait assistance: Max assist;+2 physical assistance Ambulation Distance (Feet): 0 Feet Assistive device: Rolling walker (2 wheeled)       General Gait Details: Attempted side stepping along bed, pt unable to maintain standing and legs giving out and pt plopping back down on bed.  Stairs            Wheelchair Mobility    Modified Rankin (Stroke Patients Only)       Balance Overall balance assessment: Needs assistance Sitting-balance support: Single extremity supported;Feet supported Sitting balance-Leahy Scale: Fair Sitting balance - Comments: fair- ; intermittent R lateral lean noted and pt self corrected after verbal cues; min guard to min assist to correct for sitting balance Postural control: Right lateral lean Standing balance support: Bilateral upper extremity supported Standing balance-Leahy Scale: Poor Standing balance comment: poor +, able to maintain standing with assist briefly            Pertinent Vitals/Pain Pain Assessment: No/denies pain    Home Living Family/patient expects to be discharged to:: Private residence Living Arrangements: Non-relatives/Friends(pt states he lives with a  roommate (friend who is divorced) - no family/friends present to confirm this) Available Help at Discharge: Friend(s) Type of Home: Apartment Home Access: Elevator     Home Layout: One level    Additional Comments: difficult to confirm home set up and PLOF as pt has been intermittently confused and disoriented.     Prior Function Level of Independence: Independent         Comments: Per pt report (unable to confirm), pt independent with mobility and ADL tasks, retired.      Hand Dominance        Extremity/Trunk Assessment   Upper Extremity Assessment Upper Extremity Assessment: Generalized weakness(3+/5 shoulder flexion, 4-/5 elbow flexion/extension, 4-/5 grip strength bilaterally)    Lower Extremity Assessment Lower Extremity Assessment: Defer to PT evaluation;Generalized weakness    Cervical / Trunk Assessment Cervical / Trunk Assessment: Normal  Communication   Communication: Other (comment)(soft spoken, but does speak up with verbal cues)  Cognition Arousal/Alertness: Awake/alert Behavior During Therapy: Impulsive Overall Cognitive Status: Impaired/Different from baseline Area of Impairment: Orientation;Memory;Safety/judgement        Orientation Level: Disoriented to;Time;Situation   Memory: Decreased short-term memory Following Commands: Follows one step commands consistently Safety/Judgement: Decreased awareness of safety;Decreased awareness of deficits   Problem Solving: Slow processing General Comments: follows 1 step commands consistently, slightly impulsive during mobility attempts but motivated to work with therapy. Intermittent confusion, impaired insight      General Comments General comments (skin integrity, edema, etc.): Visisble areas c/d/i.    Exercises     Assessment/Plan    PT Assessment Patient needs continued PT services  PT Problem List Decreased strength;Decreased activity tolerance;Decreased balance;Decreased mobility;Decreased safety awareness;Decreased cognition       PT Treatment Interventions DME instruction;Gait training;Functional mobility training;Therapeutic activities;Therapeutic exercise;Balance  training;Neuromuscular re-education;Cognitive remediation;Patient/family education    PT Goals (Current goals can be found in the Care Plan section)  Acute Rehab PT Goals Patient Stated Goal: get better PT Goal Formulation: With patient Time For Goal Achievement: 09/17/17 Potential to Achieve Goals: Good    Frequency Min 2X/week   Barriers to discharge Decreased caregiver support unclear of level of assist available at home    Co-evaluation PT/OT/SLP Co-Evaluation/Treatment: Yes Reason for Co-Treatment: Complexity of the patient's impairments (multi-system involvement);For patient/therapist safety;To address functional/ADL transfers PT goals addressed during session: Mobility/safety with mobility;Balance         AM-PAC PT "6 Clicks" Daily Activity  Outcome Measure Difficulty turning over in bed (including adjusting bedclothes, sheets and blankets)?: Unable Difficulty moving from lying on back to sitting on the side of the bed? : Unable Difficulty sitting down on and standing up from a chair with arms (e.g., wheelchair, bedside commode, etc,.)?: Unable Help needed moving to and from a bed to chair (including a wheelchair)?: Total Help needed walking in hospital room?: A Lot Help needed climbing 3-5 steps with a railing? : Total 6 Click Score: 7    End of Session Equipment Utilized During Treatment: Gait belt Activity Tolerance: Patient tolerated treatment well Patient left: in bed;with call bell/phone within reach;with bed alarm set;with nursing/sitter in room Nurse Communication: Mobility status PT Visit Diagnosis: Muscle weakness (generalized) (M62.81);Dizziness and giddiness (R42);Difficulty in walking, not elsewhere classified (R26.2)    Time: 1610-9604 PT Time Calculation (min) (ACUTE ONLY): 24 min   Charges:   PT Evaluation $PT Eval Moderate Complexity: 1 Mod PT Treatments $Therapeutic Activity: 8-22 mins   PT G Codes:   PT G-Codes **NOT FOR INPATIENT  CLASS** Functional Assessment  Tool Used: AM-PAC 6 Clicks Basic Mobility Functional Limitation: Mobility: Walking and moving around Mobility: Walking and Moving Around Current Status [phone]): At least 80 percent but less than 100 percent impaired, limited or restricted Mobility: Walking and Moving Around Goal Status [phone]): At least 20 percent but less than 40 percent impaired, limited or restricted    [employer], PT 09/03/2017, 4:24 PM

## 2017-09-03 NOTE — Care Management (Addendum)
Per discussion with Provider, patient seems more appropriate for rehab than for LTAC. Provider feels that patient should be able to tolerate aggression therapy (3 hours).  CSW updated. PT/OT/SLP added. I have asked Danne Baxter with CIR to follow along also.

## 2017-09-03 NOTE — Progress Notes (Signed)
Subjective: Patient continues to improve.  No further seizures noted.    Objective: Current vital signs: BP (!) 160/126   Pulse 71   Temp 97.7 F (36.5 C) (Axillary)   Resp 10   Ht 5\' 9"  (1.753 m)   Wt 102.2 kg (225 lb 5 oz)   SpO2 98%   BMI 33.27 kg/m  Vital signs in last 24 hours: Temp:  [97.7 F (36.5 C)-98.9 F (37.2 C)] 97.7 F (36.5 C) (11/27 0800) Pulse Rate:  [63-96] 71 (11/27 1000) Resp:  [9-29] 10 (11/27 1000) BP: (107-163)/(50-126) 160/126 (11/27 1000) SpO2:  [94 %-100 %] 98 % (11/27 1000) Weight:  [102.2 kg (225 lb 5 oz)] 102.2 kg (225 lb 5 oz) (11/27 0409)  Intake/Output from previous day: 11/26 0701 - 11/27 0700 In: 3016.2 [I.V.:1383.9; IV Piggyback:1632.3] Out: 3400 [Urine:3400] Intake/Output this shift: Total I/O In: 481.8 [I.V.:260; IV Piggyback:221.8] Out: -  Nutritional status: .TPN (CLINIMIX-E) Adult  Neurologic Exam: Mental Status: Alert.  Speech fluent but dysarthric.  Able to follow 3 step commands without difficulty. Cranial Nerves: II: Discs flat bilaterally; Visual fields grossly normal, pupils equal, round, reactive to light and accommodation III,IV, VI: ptosis not present, extra-ocular motions intact bilaterally V,VII: smile symmetric, facial light touch sensation normal bilaterally VIII: hearing normal bilaterally IX,X: gag reflex present XI: bilateral shoulder shrug XII: midline tongue extension Motor: Moves all extremities against gravity with no focal weakness noted Sensory: Pinprick and light touch intact throughout, bilaterally   Lab Results: Basic Metabolic Panel: Recent Labs  Lab 08/27/17 1446  08/28/17 0220 08/29/17 0420 08/30/17 0451 08/31/17 0422 08/31/17 0500 09/02/17 0505 09/02/17 1811 09/03/17 0403  NA  --   --  142 141  --  139  --  138  --  140  K  --    < > 3.4* 3.4* 3.6 3.8  --  3.6 3.2* 3.3*  CL  --   --  108 109  --  108  --  105  --  105  CO2  --   --  23 24  --  24  --  26  --  25  GLUCOSE  --   --   328* 163*  --  240*  --  283*  --  225*  BUN  --   --  16 25*  --  16  --  16  --  17  CREATININE  --   --  1.30* 1.55*  --  1.03  --  0.79  --  0.86  CALCIUM  --    < > 8.4* 8.0*  --  7.9*  --  8.2*  --  8.1*  MG 2.3  --  2.3 2.0 2.0  --  1.9 2.0  --   --   PHOS 2.8  --   --   --   --   --   --  1.5* 2.0*  --    < > = values in this interval not displayed.    Liver Function Tests: Recent Labs  Lab 08/27/17 1446 08/29/17 0420 09/02/17 0505 09/03/17 0403  AST 27 22 22  40  ALT 25 20 23  38  ALKPHOS 71 54 59 57  BILITOT 1.2 0.7 0.9 0.4  PROT 7.0 6.0* 6.0* 5.8*  ALBUMIN 3.7 3.0* 2.5* 2.4*   No results for input(s): LIPASE, AMYLASE in the last 168 hours. Recent Labs  Lab 08/30/17 1118  AMMONIA 18    CBC: Recent Labs  Lab 08/28/17  0220 08/29/17 0420 09/01/17 0046 09/02/17 0505 09/03/17 0403  WBC 15.7* 10.9* 9.1 8.0 6.2  NEUTROABS 13.5*  --   --  5.9 3.9  HGB 16.2 13.8 14.1 12.7* 12.6*  HCT 49.3 40.1 41.4 36.5* 36.2*  MCV 88.2 88.0 88.9 87.6 87.3  PLT 191 160 175 168 203    Cardiac Enzymes: Recent Labs  Lab 08/28/17 0220  TROPONINI 0.11*    Lipid Panel: Recent Labs  Lab 08/28/17 0220 08/30/17 0119 09/02/17 0505  TRIG 205* 277* 177*    CBG: Recent Labs  Lab 09/02/17 1531 09/02/17 1947 09/02/17 2330 09/03/17 0351 09/03/17 0748  GLUCAP 277* 195* 224* 205* 58*    Microbiology: Results for orders placed or performed during the hospital encounter of 08/26/17  MRSA PCR Screening     Status: None   Collection Time: 08/27/17  3:24 AM  Result Value Ref Range Status   MRSA by PCR NEGATIVE NEGATIVE Final    Comment:        The GeneXpert MRSA Assay (FDA approved for NASAL specimens only), is one component of a comprehensive MRSA colonization surveillance program. It is not intended to diagnose MRSA infection nor to guide or monitor treatment for MRSA infections.   Culture, respiratory (NON-Expectorated)     Status: None   Collection Time:  09/01/17 12:30 AM  Result Value Ref Range Status   Specimen Description TRACHEAL ASPIRATE  Final   Special Requests NONE  Final   Gram Stain   Final    ABUNDANT WBC PRESENT, PREDOMINANTLY PMN RARE SQUAMOUS EPITHELIAL CELLS PRESENT ABUNDANT GRAM POSITIVE RODS FEW GRAM NEGATIVE RODS    Culture   Final    Consistent with normal respiratory flora. Performed at [facility], [place] [address] [place], [place] [address]    Report Status 09/03/2017 FINAL  Final  Urine Culture     Status: None   Collection Time: 09/01/17 12:39 AM  Result Value Ref Range Status   Specimen Description URINE, RANDOM  Final   Special Requests NONE  Final   Culture   Final    NO GROWTH Performed at [facility], [place] [address], [place], [place] [address]    Report Status 09/02/2017 FINAL  Final  Culture, blood (Routine X 2) w Reflex to ID Panel     Status: None (Preliminary result)   Collection Time: 09/01/17 12:46 AM  Result Value Ref Range Status   Specimen Description BLOOD BLOOD LEFT HAND  Final   Special Requests   Final    BOTTLES DRAWN AEROBIC AND ANAEROBIC Blood Culture adequate volume   Culture NO GROWTH 2 DAYS  Final   Report Status PENDING  Incomplete  Culture, blood (Routine X 2) w Reflex to ID Panel     Status: None (Preliminary result)   Collection Time: 09/01/17  1:00 AM  Result Value Ref Range Status   Specimen Description BLOOD RIGHT ANTECUBITAL  Final   Special Requests   Final    BOTTLES DRAWN AEROBIC AND ANAEROBIC Blood Culture adequate volume   Culture NO GROWTH 2 DAYS  Final   Report Status PENDING  Incomplete    Coagulation Studies: No results for input(s): LABPROT, INR in the last 72 hours.  Imaging: No results found.  Medications:  I have reviewed the patient's current medications. Scheduled: . aspirin  81 mg Per Tube Daily  . bromocriptine  12.5 mg Oral Daily  . chlorhexidine  15 mL Mouth Rinse BID  . enoxaparin (LOVENOX)  injection  40 mg Subcutaneous Q24H   . insulin aspart  0-15 Units Subcutaneous Q4H  . insulin glargine  15 Units Subcutaneous Q24H  . mouth rinse  15 mL Mouth Rinse q12n4p  . sodium chloride flush  10-40 mL Intracatheter Q12H    Assessment/Plan: No further seizures.  Mental status continues to improve.  On Depakote.   Depakote level 27.    Recommendations: 1. Increase Depakote to 750mg  BID 2. Depakote level in AM 3. Continue seizure precautions   LOS: 7 days   Alexis Goodell, MD Neurology [phone] 09/03/2017  11:38 AM

## 2017-09-03 NOTE — Evaluation (Signed)
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 Intubated in ED on 11/19, extubated 11/20 AM. Persistent encephalopathy requiring re-intubation 11/21 early AM and self extubated on 11/24. 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 Seen for OT and PT evaluations on 11/27.   Clinical Impression   Pt seen for OT evaluation this date. Per chart review and pt report, pt was independent prior to admission. No family present to help confirm PLOF/home set up at time of assessment. Pt pleasant, eager to work with therapy. Generally appropriate and able to follow commands consistently. Slightly impulsive with initial attempt to stand. Pt noted dizziness throughout session, only worsening once pt was rolling to L side after bed mobility and functional transfer training with significant L beating nystagmus noted afterwards. Pt denies history of vertigo or dizziness prior to admission. Pt able to assist in donning socks while seated EOB with min-mod assist to help thread socks over width of foot, pt able to pull them up. PT providing min guard to min assist for poor sitting balance throughout. Pt presents with impaired strength, balance, activity tolerance, and cognition which impact his ability to perform functional mobility and self care tasks. Will benefit from skilled OT services to address noted impairments and functional deficits in order to maximize return to PLOF. Recommend transition to acute inpatient rehab following hospitalization.    Follow Up Recommendations  CIR    Equipment Recommendations  Other (comment)(TBD)    Recommendations for Other Services Rehab consult      Precautions / Restrictions Precautions Precautions: Fall Restrictions Weight Bearing Restrictions: No      Mobility Bed Mobility Overal bed mobility: Needs Assistance Bed Mobility: Supine to Sit;Sit to Supine;Rolling Rolling: Min assist(significant increase in vertigo with rolling to the L side with L beating nystagmus)   Supine to sit: Mod assist;+2 for physical assistance;HOB elevated Sit to supine: +2 for physical assistance;Mod assist      Transfers Overall transfer level: Needs assistance Equipment used: Standard walker Transfers: Sit to/from Stand Sit to Stand: Max assist;+2 physical assistance         General transfer comment: mildly impulsive with initial transfer attempts but did follow commands well afterwards, waiting for therapy instruction    Balance Overall balance assessment: Needs assistance Sitting-balance support: Single extremity supported;Feet supported Sitting balance-Leahy Scale: Fair Sitting balance - Comments: fair- ; intermittent R lateral lean noted and pt self corrected after verbal cues; min guard to min assist to correct for sitting balance Postural control: Right lateral lean Standing balance support: Bilateral upper extremity supported Standing balance-Leahy Scale: Poor Standing balance comment: poor +, able to maintain standing with assist briefly                           ADL either performed or assessed with clinical judgement   ADL Overall ADL's : Needs assistance/impaired Eating/Feeding: NPO Eating/Feeding Details (indicate cue type and reason): SLP in to assess pt at end of session Grooming: Bed level;Minimal assistance Grooming Details (indicate cue type and reason): min assist to clean hands with cloth after BM just prior to session Upper Body Bathing: Sitting;Minimal assistance   Lower Body Bathing: Sitting/lateral leans;Moderate assistance  Upper Body Dressing : Sitting;Minimal assistance   Lower Body Dressing:  Moderate assistance;Minimal assistance;Sitting/lateral leans Lower Body Dressing Details (indicate cue type and reason): Pt able to assist in donning socks while seated EOB with min-mod assist to help thread sock over width of foot, pt able to pull them up. PT providing min guard to min assist for sitting balance throughout   Toilet Transfer Details (indicate cue type and reason): unsafe to attempt           General ADL Comments: pt generally min assist for UB ADL, mod assist for LB ADL tasks     Vision Baseline Vision/History: Wears glasses Wears Glasses: At all times Patient Visual Report: No change from baseline Vision Assessment?: Yes Eye Alignment: Within Functional Limits Ocular Range of Motion: Within Functional Limits Alignment/Gaze Preference: Within Defined Limits Tracking/Visual Pursuits: Able to track stimulus in all quads without difficulty Visual Fields: No apparent deficits Additional Comments: noted L beating nystagmus when lying supine and rolling to his L side after bed mobility and transfer training; pt noted the room was spinning and this increased with rolling     Perception     Praxis      Pertinent Vitals/Pain Pain Assessment: No/denies pain     Hand Dominance     Extremity/Trunk Assessment Upper Extremity Assessment Upper Extremity Assessment: Generalized weakness(3+/5 shoulder flexion, 4-/5 elbow flexion/extension, 4-/5 grip strength bilaterally)   Lower Extremity Assessment Lower Extremity Assessment: Defer to PT evaluation;Generalized weakness   Cervical / Trunk Assessment Cervical / Trunk Assessment: Normal   Communication Communication Communication: Other (comment)(soft spoken, but does speak up with verbal cues)   Cognition Arousal/Alertness: Awake/alert Behavior During Therapy: Impulsive Overall Cognitive Status: Impaired/Different from baseline Area of Impairment: Orientation;Memory;Safety/judgement                  Orientation Level: Disoriented to;Time;Situation   Memory: Decreased short-term memory Following Commands: Follows one step commands consistently Safety/Judgement: Decreased awareness of safety;Decreased awareness of deficits     General Comments: follows 1 step commands consistently, slightly impulsive during mobility attempts but motivated to work with therapy. Intermittent confusion, impaired insight   General Comments       Exercises     Shoulder Instructions      Home Living Family/patient expects to be discharged to:: Private residence Living Arrangements: Non-relatives/Friends(pt states he lives with a roommate (friend who is divorced) - no family/friends present to confirm this)   Type of Home: Apartment Home Access: Elevator     Home Layout: One level     Bathroom Shower/Tub: Tub/shower unit             Additional Comments: difficult to confirm home set up and PLOF as pt has been intermittently confused and disoriented.       Prior Functioning/Environment Level of Independence: Independent        Comments: Per pt report (unable to confirm), pt independent with mobility and ADL tasks, retired.         OT Problem List: Decreased strength;Decreased knowledge of use of DME or AE;Decreased activity tolerance;Decreased cognition;Decreased safety awareness;Impaired balance (sitting and/or standing)      OT Treatment/Interventions: Self-care/ADL training;Therapeutic activities;Therapeutic exercise;Balance training;DME and/or AE instruction;Patient/family education    OT Goals(Current goals can be found in the care plan section) Acute Rehab OT Goals Patient Stated Goal: get better OT Goal Formulation: With patient Time For Goal Achievement: 09/17/17 Potential to Achieve Goals: Good  OT Frequency: Min 3X/week   Barriers to  D/C:            Co-evaluation PT/OT/SLP Co-Evaluation/Treatment: Yes Reason for Co-Treatment: Necessary to address  cognition/behavior during functional activity;For patient/therapist safety;To address functional/ADL transfers PT goals addressed during session: Mobility/safety with mobility;Balance OT goals addressed during session: ADL's and self-care;Proper use of Adaptive equipment and DME      AM-PAC PT "6 Clicks" Daily Activity     Outcome Measure Help from another person eating meals?: Total(NPO) Help from another person taking care of personal grooming?: A Little Help from another person toileting, which includes using toliet, bedpan, or urinal?: A Lot Help from another person bathing (including washing, rinsing, drying)?: A Lot Help from another person to put on and taking off regular upper body clothing?: A Little Help from another person to put on and taking off regular lower body clothing?: A Lot 6 Click Score: 13   End of Session Equipment Utilized During Treatment: Gait belt;Rolling walker  Activity Tolerance: Patient tolerated treatment well(limited slightly by dizziness/vertigo) Patient left: in bed;with call bell/phone within reach;with bed alarm set;with nursing/sitter in room  OT Visit Diagnosis: Other abnormalities of gait and mobility (R26.89);Dizziness and giddiness (R42);Other symptoms and signs involving cognitive function;Muscle weakness (generalized) (M62.81)                Time: 8333-8329 OT Time Calculation (min): 24 min Charges:  OT General Charges $OT Visit: 1 Visit OT Evaluation $OT Eval Moderate Complexity: 1 Mod G-Codes: OT G-codes **NOT FOR INPATIENT CLASS** Functional Assessment Tool Used: AM-PAC 6 Clicks Daily Activity;Clinical judgement Functional Limitation: Self care Self Care Current Status (V9166): At least 60 percent but less than 80 percent impaired, limited or restricted Self Care Goal Status (M6004): At least 40 percent but less than 60 percent impaired, limited or restricted   Jeni Salles, MPH, MS, OTR/L ascom [phone] 09/03/17, 4:07 PM

## 2017-09-03 NOTE — Evaluation (Signed)
Clinical/Bedside Swallow Evaluation Patient Details  Name: Andrew Horton MRN: [id number] Date of Birth: 01-28-1946  Today's Date: 09/03/2017 Time: SLP Start Time (ACUTE ONLY): 70 SLP Stop Time (ACUTE ONLY): 1630 SLP Time Calculation (min) (ACUTE ONLY): 60 min  Past Medical History:  Past Medical History:  Diagnosis Date  . Brain tumor (benign) (HCC)    HPI:  Pt is a 71 y/o male with past medical history of brain tumor, DM on insulin, depression([facility] chart) who presents to the emergency department after a new onset seizure.  He was found bathroom of a gas station in a postictal state.  Upon arrival to the emergency department the patient was verbal but again seized.  He received Ativan but sees yet again.  CT of the head show no acute intracranial problem.  However, the patient continued to exhibit posturing and there was concern for airway protection which prompted the emergency department staff to intubate. Pt was orally intubated for 2 days then trial of extubation was attempted. Pt required reintubation the next day and remained orally intubated for 4 days until self-extubated. Pt has received nutritional support. Pt has had periods of agitation and confusion per staff report.    Assessment / Plan / Recommendation Clinical Impression  Pt appears to present w/ oropharyngeal phase dysphagia impacted by declined Mental Status/Cognitive status impairing his overall awareness during task of oral intake. Pt required verbal cues to reattend to tasks intermittently during this eval. Due to this and his impulsive behavior intermittently as well as poor follow through, pt is at increased risk for aspiration. With strict monitoring, pt appeared to consume few po trials of Nectar consistency liquids via Cup and small tsps of Puree w/ no immediate, overt s/s of aspiration noted - no cough/throat clearing, no decline in respiratory status(O2 sats 89-95% - baseline for pt; RR low 20's). Unable to assess  vocal quality d/t dysphonia/aphonia - pt appeared to attempt to NOT talk/phonate when encouraged by SLP. Trials of thin liquids were attempted, however, pt exhibited overt coughing when he began to drink impulsively taking too large a sip repeatedly. Oral phase appeared grossly wfl for bolus transfer and clearing post swallow. Pt required full monitoring and assistance - verbal cues to attend to po tasks intermittently. Recommend a conservative oral diet of dysphagia level 1(puree) w/ Nectar liquids; strict aspiration precautions; pills in puree - CRUSHED as able at this time; full feeding support and monitoring w/ po's. ST services will f/u w/ toleration of diet and trials to upgrade when appropriate. SLP Visit Diagnosis: Dysphagia, oropharyngeal phase (R13.12)    Aspiration Risk  Moderate aspiration risk    Diet Recommendation  Dysphagia level 1(puree) w/ Nectar consistency liquids; strict aspiration precautions; feeding support and supervision w/ all oral intake d/t impulsivity and Cognitive/Mental status decline.   Medication Administration: Crushed with puree    Other  Recommendations Recommended Consults: (Dietician f/u) Oral Care Recommendations: Oral care BID;Staff/trained caregiver to provide oral care Other Recommendations: Order thickener from pharmacy;Prohibited food (jello, ice cream, thin soups);Remove water pitcher;Have oral suction available   Follow up Recommendations Skilled Nursing facility      Frequency and Duration min 3x week  2 weeks       Prognosis Prognosis for Safe Diet Advancement: Fair(-Good) Barriers to Reach Goals: Cognitive deficits;Severity of deficits;Behavior      Swallow Study   General Date of Onset: 08/26/17 HPI: Pt is a 71 y/o male with past medical history of brain tumor, DM on insulin, depression([facility]  chart) who presents to the emergency department after a new onset seizure.  He was found bathroom of a gas station in a postictal state.  Upon  arrival to the emergency department the patient was verbal but again seized.  He received Ativan but sees yet again.  CT of the head show no acute intracranial problem.  However, the patient continued to exhibit posturing and there was concern for airway protection which prompted the emergency department staff to intubate. Pt was orally intubated for 2 days then trial of extubation was attempted. Pt required reintubation the next day and remained orally intubated for 4 days until self-extubated. Pt has received nutritional support. Pt has had periods of agitation and confusion per staff report.  Type of Study: Bedside Swallow Evaluation Previous Swallow Assessment: none reported Diet Prior to this Study: NPO(TPN support) Temperature Spikes Noted: No(wbc 6.2) Respiratory Status: Room air(has weaned from 3 liters last night) History of Recent Intubation: Yes Length of Intubations (days): 6 days Date extubated: 08/31/17 Behavior/Cognition: Alert;Cooperative;Confused;Impulsive;Distractible;Requires cueing Oral Cavity Assessment: Dry Oral Care Completed by SLP: Yes Oral Cavity - Dentition: Adequate natural dentition Vision: Functional for self-feeding Self-Feeding Abilities: Able to feed self;Needs assist;Needs set up;Total assist Patient Positioning: Upright in bed Baseline Vocal Quality: Hoarse;Low vocal intensity(raspy/breathy; mostly aphonic - reserved phonations) Volitional Cough: Congested(min; reduced attempts despite encouragement) Volitional Swallow: Able to elicit    Oral/Motor/Sensory Function Overall Oral Motor/Sensory Function: Within functional limits   Ice Chips Ice chips: Within functional limits Presentation: Spoon(fed; 5 trials) Other Comments: verbal cues to reattend to task of oral intake of ice chips   Thin Liquid Thin Liquid: Impaired Presentation: Cup;Self Fed(4 trials - became impulsive during drinking despite cues) Oral Phase Impairments: Poor awareness of  bolus(min) Oral Phase Functional Implications: (reduced attention; spillage) Pharyngeal  Phase Impairments: Cough - Immediate(reduced attention to task) Other Comments: became impulsive    Nectar Thick Nectar Thick Liquid: Within functional limits Presentation: Cup;Self Fed(5 single sips - strictly monitored)   Honey Thick Honey Thick Liquid: Not tested   Puree Puree: Within functional limits Presentation: Spoon(fed; 7 trials) Other Comments: verbal cues to reattend to task x1   Solid   GO   Solid: Not tested Other Comments: will attempt to assess at pt's medical and cognitive status' improve    Functional Assessment Tool Used: clinical judgement Functional Limitations: Swallowing Swallow Current Status (F6384): At least 40 percent but less than 60 percent impaired, limited or restricted Swallow Goal Status [phone]): At least 20 percent but less than 40 percent impaired, limited or restricted Swallow Discharge Status [phone]): At least 1 percent but less than 20 percent impaired, limited or restricted     Orinda Kenner, [place], CCC-SLP Watson,Katherine 09/03/2017,4:25 PM

## 2017-09-03 NOTE — Progress Notes (Signed)
[facility] [place] NAME: Andrew Horton    MR#:  [id number]  DATE OF BIRTH:  [birth year]  SUBJECTIVE:  Patient now extubated.   Off IV precedex gtt More awake and talking now REVIEW OF SYSTEMS:   Review of Systems  Unable to perform ROS: Mental acuity    DRUG ALLERGIES:   Allergies  Allergen Reactions  . Sulfa Antibiotics Hives    VITALS:  Blood pressure (!) 156/70, pulse (!) 101, temperature 97.7 F (36.5 C), temperature source Axillary, resp. rate 15, height 5\' 9"  (1.753 m), weight 102.2 kg (225 lb 5 oz), SpO2 93 %.  PHYSICAL EXAMINATION:   Physical Exam  GENERAL:  71 y.o.-year-old patient lying in the bed with no acute distress.  Critically illl EYES: Pupils equal, round, reactive to light and accommodation. No scleral icterus. Extraocular muscles intact.  HEENT: Head atraumatic, normocephalic. Oropharynx and nasopharynx clear.nECK:  Supple, no jugular venous distention. No thyroid enlargement, no tenderness.  LUNGS: Normal breath sounds bilaterally, no wheezing, rales, rhonchi. No use of accessory muscles of respiration.  CARDIOVASCULAR: S1, S2 normal. No murmurs, rubs, or gallops.  ABDOMEN: Soft, nontender, nondistended.  No organomegaly or mass.  EXTREMITIES: No cyanosis, clubbing or edema b/l.    NEUROLOGIC: Opens eyes to verbal commands.  Moves all extremities spontaneously  pSYCHIATRIC: Unable to assess SKIN: No obvious rash, lesion, or ulcer.   LABORATORY PANEL:  CBC Recent Labs  Lab 09/03/17 0403  WBC 6.2  HGB 12.6*  HCT 36.2*  PLT 203    Chemistries  Recent Labs  Lab 09/03/17 0403  NA 140  K 3.3*  CL 105  CO2 25  GLUCOSE 225*  BUN 17  CREATININE 0.86  CALCIUM 8.1*  MG 2.1  AST 40  ALT 38  ALKPHOS 57  BILITOT 0.4   Cardiac Enzymes Recent Labs  Lab 08/28/17 0220  TROPONINI 0.11*   RADIOLOGY:  No results found. ASSESSMENT AND PLAN:  71 year old male admitted for status  epilepticus. 1.Acute Encephalopathy - Pt came in withStatus epilepticus: New onset seizures.  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 This is nonspecific but meningioma is favored - EEG with general background slowing but no evidence of electrographic seizure activity - neurology consult appreciated. - continue IV Keppra--- changed to IV Depakote   2. Acute respiratory failure: With hypoxemia; intubated for airway protection.  --Patient now extubated  3. Acute kidney injury: Hydrate with intravenous fluid. Avoid nephrotoxic agents.    4. Hypertension: Continue current medications  5. Diabetes mellitus type 2: -Sliding scale insulin  6. Hyperlipidemia: Continue statin therapy  7. DVT prophylaxis: Heparin  Case discussed with Care Management/Social Worker. Management plans discussed with the patient, family and they are in agreement.  CODE STATUS: full  DVT Prophylaxis: Heparin  TOTAL TIME TAKING CARE OF THIS PATIENT: *25* minutes.  >50% time spent on counselling and coordination of care .  Note: This dictation was prepared with Dragon dictation along with smaller phrase technology. Any transcriptional errors that result from this process are unintentional.  Fritzi Mandes M.D on 09/03/2017   Between 7am to 6pm - Pager - [phone]  After 6pm go to [email or web address] - password [email or web address] [facility]  Office  [phone]  CC: Primary care physician; System, Pcp Not In

## 2017-09-03 NOTE — Progress Notes (Signed)
OT Cancellation Note  Patient Details Name: Andrew Horton MRN: [id number] DOB: 1946/03/01   Cancelled Treatment:    Reason Eval/Treat Not Completed: Medical issues which prohibited therapy. Order received, chart reviewed. Recent BP 160/126, inappropriate for OT evaluation at this time. Will continue to follow acutely and re-attempt at later datet/time as pt is medically appropriate.  Jeni Salles, MPH, MS, OTR/L ascom [phone] 09/03/17, 11:51 AM

## 2017-09-04 ENCOUNTER — Inpatient Hospital Stay: Payer: Medicare Other

## 2017-09-04 DIAGNOSIS — F4323 Adjustment disorder with mixed anxiety and depressed mood: Secondary | ICD-10-CM

## 2017-09-04 DIAGNOSIS — F341 Dysthymic disorder: Secondary | ICD-10-CM

## 2017-09-04 LAB — BASIC METABOLIC PANEL
ANION GAP: 10 (ref 5–15)
BUN: 18 mg/dL (ref 6–20)
CALCIUM: 8.6 mg/dL — AB (ref 8.9–10.3)
CO2: 23 mmol/L (ref 22–32)
CREATININE: 1 mg/dL (ref 0.61–1.24)
Chloride: 103 mmol/L (ref 101–111)
GFR calc Af Amer: >60 mL/min (ref >60)
GLUCOSE: 308 mg/dL — AB (ref 65–99)
Potassium: 4.1 mmol/L (ref 3.5–5.1)
Sodium: 136 mmol/L (ref 135–145)

## 2017-09-04 LAB — GLUCOSE, CAPILLARY
Glucose-Capillary: 258 mg/dL — ABNORMAL HIGH (ref 65–99)
Glucose-Capillary: 309 mg/dL — ABNORMAL HIGH (ref 65–99)
Glucose-Capillary: 306 mg/dL — ABNORMAL HIGH (ref 65–99)
Glucose-Capillary: 319 mg/dL — ABNORMAL HIGH (ref 65–99)
Glucose-Capillary: 307 mg/dL — ABNORMAL HIGH (ref 65–99)
Glucose-Capillary: 287 mg/dL — ABNORMAL HIGH (ref 65–99)
Glucose-Capillary: 277 mg/dL — ABNORMAL HIGH (ref 65–99)
Glucose-Capillary: 208 mg/dL — ABNORMAL HIGH (ref 65–99)
Glucose-Capillary: 202 mg/dL — ABNORMAL HIGH (ref 65–99)

## 2017-09-04 LAB — VALPROIC ACID LEVEL: VALPROIC ACID LVL: 36 ug/mL — AB (ref 50.0–100.0)

## 2017-09-04 LAB — PROCALCITONIN: Procalcitonin: 0.21 ng/mL

## 2017-09-04 MED ORDER — DEXTROSE 5 % IV SOLN
2.0000 g | Freq: Three times a day (TID) | INTRAVENOUS | Status: AC
  Administered 2017-09-04 – 2017-09-05 (×3): 2 g via INTRAVENOUS
  Filled 2017-09-04 (×4): qty 2

## 2017-09-04 MED ORDER — INSULIN GLARGINE 100 UNIT/ML ~~LOC~~ SOLN
10.0000 [IU] | SUBCUTANEOUS | Status: DC
  Filled 2017-09-04: qty 0.1

## 2017-09-04 MED ORDER — SODIUM CHLORIDE 0.9 % IV SOLN
INTRAVENOUS | Status: DC
  Administered 2017-09-04: 2.6 [IU]/h via INTRAVENOUS
  Filled 2017-09-04: qty 1

## 2017-09-04 MED ORDER — VANCOMYCIN HCL IN DEXTROSE 1-5 GM/200ML-% IV SOLN
1000.0000 mg | Freq: Once | INTRAVENOUS | Status: AC
  Administered 2017-09-04: 1000 mg via INTRAVENOUS
  Filled 2017-09-04: qty 200

## 2017-09-04 MED ORDER — LISINOPRIL 5 MG PO TABS
10.0000 mg | ORAL_TABLET | Freq: Every day | ORAL | Status: DC
  Administered 2017-09-04: 10 mg via ORAL
  Filled 2017-09-04: qty 2

## 2017-09-04 MED ORDER — FUROSEMIDE 10 MG/ML IJ SOLN
40.0000 mg | INTRAMUSCULAR | Status: AC
  Administered 2017-09-04: 40 mg via INTRAVENOUS
  Filled 2017-09-04: qty 4

## 2017-09-04 MED ORDER — VALPROATE SODIUM 500 MG/5ML IV SOLN
1000.0000 mg | Freq: Two times a day (BID) | INTRAVENOUS | Status: DC
  Administered 2017-09-04 – 2017-09-05 (×2): 1000 mg via INTRAVENOUS
  Filled 2017-09-04 (×4): qty 10

## 2017-09-04 MED ORDER — METOPROLOL TARTRATE 25 MG PO TABS
25.0000 mg | ORAL_TABLET | Freq: Two times a day (BID) | ORAL | Status: DC
  Administered 2017-09-04: 25 mg via ORAL
  Filled 2017-09-04 (×2): qty 1

## 2017-09-04 MED ORDER — INSULIN GLARGINE 100 UNIT/ML ~~LOC~~ SOLN
10.0000 [IU] | Freq: Every day | SUBCUTANEOUS | Status: DC
  Administered 2017-09-04: 10 [IU] via SUBCUTANEOUS
  Filled 2017-09-04: qty 0.1

## 2017-09-04 NOTE — Progress Notes (Addendum)
Pt placed on bipap 30% - tachypnea 30-40's, tachypneic 120's with frequent missed beats, SpO2 90% on RA, pt with decreased alertness. Lung sounds with bilat rhonchi-pt does not have strength to cough up secretions that are complicating his resp status. Bed CPT completed, pt has been nasotracheal suctioned x 2 with minimal results from pt agitation.

## 2017-09-04 NOTE — Consult Note (Signed)
PULMONARY / CRITICAL CARE MEDICINE   Name: Andrew Horton MRN: [id number] DOB: September 25, 1946    ADMISSION DATE:  08/26/2017 CONSULTATION DATE: 08/26/2017  REFERRING MD: Dr. Marcille Blanco   CHIEF COMPLAINT: Seizure Activity   PT PROFILE: 43 M with hx or prolactinoma, no prior seizure history, admitted with status epilepticus and acute encephalopathy. Intubated in ED. Extubated 11/20 AM. Persistent encephalopathy requiring re-intubation 11/21 early AM.   MAJOR EVENTS/TEST RESULTS: 11/19 admission via EMS> ED with status epilepticus. Intubated in ED. Treated with phenytoin, then Keprpa 11/19 CTH: No acute findings 11/20 Extubated. Persistent encephalopathy > at times agitated. Dexmedetomidine ordered.  11/20 Neurology consultation: serum prolactin ordered. MRI brain, EEG ordered. Keppra continued 11/20 EEG:  general background slowing.  This finding may be seen with a diffuse disturbance that is etiologically nonspecific, but may include a post-ictal state, among other possibilities.  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 No definite extension outside of the left cavernous sinus 11/24 Self extubated 11/25 Encephalopathic, tenuous respiratory status due to poor cough mechanics 11/26 Keppra changed to VPA 11/27 Encephalopathy much improved.  Dexmedetomidine discontinued.  Oriented to person and place. SLP eval and PT/OT ordered 11/28 Continued improvement. Transfer to [facility] floor ordered  INDWELLING DEVICES:: ETT 11/19 >> 11/20, 11/21 >> 11/24 (self extubated) R IJ CVL 11/21 >> 11/28  MICRO DATA: MRSA PCR 11/20 >> NEG Blood 11/25 >> NEG Resp 11/25 >> NOF Urine 11/25 >> NEG  ANTIMICROBIALS:  Acyclovir 11/21 >> 11/27    SUBJECTIVE:  Mildly somnolent.   No new complaints.  No distress.Marland Kitchen  VITAL SIGNS: BP (!) 129/54   Pulse (!) 101   Temp 98.2 F (36.8 C) (Oral)   Resp 20   Ht 5\' 9"  (1.753 m)   Wt 103.6 kg (228 lb 6.3 oz)   SpO2 96%   BMI 33.73 kg/m   HEMODYNAMICS:    VENTILATOR SETTINGS:    INTAKE / OUTPUT: I/O last 3 completed shifts: In: 2213.7 [I.V.:1155.9; IV Piggyback:1057.8] Out: 0865 [Urine:4035]  PHYSICAL EXAMINATION: General:RASS 0 Neuro: CNs intact, MAEs HEENT: NCAT, sclerae white Neck: no JVD Cardiovascular: reg, no M Lungs: Rattling cough, no wheezes Abdomen: NABS, soft Ext: warm, no edema    LABS:  BMET Recent Labs  Lab 08/31/17 0422 09/02/17 0505 09/02/17 1811 09/03/17 0403  NA 139 138  --  140  K 3.8 3.6 3.2* 3.3*  CL 108 105  --  105  CO2 24 26  --  25  BUN 16 16  --  17  CREATININE 1.03 0.79  --  0.86  GLUCOSE 240* 283*  --  225*    Electrolytes Recent Labs  Lab 08/31/17 0422 08/31/17 0500 09/02/17 0505 09/02/17 1811 09/03/17 0403  CALCIUM 7.9*  --  8.2*  --  8.1*  MG  --  1.9 2.0  --  2.1  PHOS  --   --  1.5* 2.0* 3.1    CBC Recent Labs  Lab 09/01/17 0046 09/02/17 0505 09/03/17 0403  WBC 9.1 8.0 6.2  HGB 14.1 12.7* 12.6*  HCT 41.4 36.5* 36.2*  PLT 175 168 203    Coag's No results for input(s): APTT, INR in the last 168 hours.  Sepsis Markers Recent Labs  Lab 08/29/17 0420 09/01/17 0046 09/02/17 0505  PROCALCITON 0.49 0.16 0.14  ABG Recent Labs  Lab 08/31/17 0453 08/31/17 1219 09/02/17 0500  PHART 7.30* 7.32* 7.49*  PCO2ART 47 46 36  PO2ART 80* 75* 69*    Liver Enzymes Recent Labs  Lab 08/29/17 0420 09/02/17 0505 09/03/17 0403  AST 22 22 40  ALT 20 23 38  ALKPHOS 54 59 57  BILITOT 0.7 0.9 0.4  ALBUMIN 3.0* 2.5* 2.4*    Cardiac Enzymes No results for input(s): TROPONINI, PROBNP in the last 168 hours.  Glucose Recent Labs  Lab 09/03/17 1216 09/03/17 1232 09/03/17 1600 09/03/17 2244 09/04/17 0733 09/04/17 1306  GLUCAP 207*  211* 198* 194* 258* 309*    CXR: No new film   ASSESSMENT / PLAN: Acute ventilator dependent respiratory failure due to AMS, resolved Hypotension, resolved AKI, nonoliguric, resolved Dysphagia due to encephalopathy Doubt encephalitis DM2 with hyperglycemia Status epilepticus, resolved New onset seizures, controlled  Acute encephalopathy, much improved Deconditioning ? suicidal ideation     Cont supplemental oxygen as needed to maintain SPO2 >90% Monitor BMET intermittently Monitor I/Os Correct electrolytes as indicated  Advance diet as able per SLP recs DVT px: SQ enoxaparin Monitor CBC intermittently Transfuse per usual guidelines   Lantus initiated 11/28 Cont mod scale SSI ACHS Cont valproic acid  - change to PO when able. Dosing per Neuro PT/OT eval ordered Will likely need short term SNF/Rehab after dicharge  Transfer to med-surg. After transfer, PCCM will sign off. Please call if we can be of further assistance  Merton Border, MD PCCM service Mobile [phone] Pager [phone] 09/04/2017 2:29 PM

## 2017-09-04 NOTE — Progress Notes (Signed)
Pt not a candidate for an inpt rehab admission at [facility] at this time. Feel pt will need supervision at home which he does not have available . Note with suicide sitter at this time. Recommend SNF rehab. I have discussed with RN CM, Marshell Garfinkel. Would also recommend referral to [place] rehab for he is from [place]. [phone]

## 2017-09-04 NOTE — Progress Notes (Signed)
Subjective: Patient more lethargic today.  Not as cooperative.    Objective: Current vital signs: BP (!) 158/79   Pulse 100   Temp 98.2 F (36.8 C) (Oral)   Resp 20   Ht 5\' 9"  (1.753 m)   Wt 103.6 kg (228 lb 6.3 oz)   SpO2 96%   BMI 33.73 kg/m  Vital signs in last 24 hours: Temp:  [97.8 F (36.6 C)-98.2 F (36.8 C)] 98.2 F (36.8 C) (11/28 0800) Pulse Rate:  [79-105] 100 (11/28 1100) Resp:  [14-30] 20 (11/28 1100) BP: (131-206)/(58-95) 158/79 (11/28 1100) SpO2:  [93 %-100 %] 96 % (11/28 1100) Weight:  [103.6 kg (228 lb 6.3 oz)] 103.6 kg (228 lb 6.3 oz) (11/28 0500)  Intake/Output from previous day: 11/27 0701 - 11/28 0700 In: 833.6 [I.V.:504.3; IV Piggyback:329.3] Out: 2325 [Urine:2325] Intake/Output this shift: Total I/O In: 320 [P.O.:240; I.V.:30; IV Piggyback:50] Out: -  Nutritional status: DIET - DYS 1 Room service appropriate? Yes with Assist; Fluid consistency: Nectar Thick  Neurologic Exam: Mental Status: Lethargic.  Does not stay awake long enough to follow commands.  Speech minimal and dysarthric.   Cranial Nerves: II: Discs flat bilaterally; Blinks to bilateral confrontation, pupils equal, round, reactive to light and accommodation III,IV, VI: ptosis not present, extra-ocular motions intact bilaterally V,VII: face symmetric, facial light touch sensation normal bilaterally VIII: hearing normal bilaterally IX,X: gag reflex present XI: bilateral shoulder shrug XII: unable to test Motor: Moves all extremities spontaneously  Lab Results: Basic Metabolic Panel: Recent Labs  Lab 08/29/17 0420 08/30/17 0451 08/31/17 0422 08/31/17 0500 09/02/17 0505 09/02/17 1811 09/03/17 0403  NA 141  --  139  --  138  --  140  K 3.4* 3.6 3.8  --  3.6 3.2* 3.3*  CL 109  --  108  --  105  --  105  CO2 24  --  24  --  26  --  25  GLUCOSE 163*  --  240*  --  283*  --  225*  BUN 25*  --  16  --  16  --  17  CREATININE 1.55*  --  1.03  --  0.79  --  0.86  CALCIUM 8.0*   --  7.9*  --  8.2*  --  8.1*  MG 2.0 2.0  --  1.9 2.0  --  2.1  PHOS  --   --   --   --  1.5* 2.0* 3.1    Liver Function Tests: Recent Labs  Lab 08/29/17 0420 09/02/17 0505 09/03/17 0403  AST 22 22 40  ALT 20 23 38  ALKPHOS 54 59 57  BILITOT 0.7 0.9 0.4  PROT 6.0* 6.0* 5.8*  ALBUMIN 3.0* 2.5* 2.4*   No results for input(s): LIPASE, AMYLASE in the last 168 hours. Recent Labs  Lab 08/30/17 1118  AMMONIA 18    CBC: Recent Labs  Lab 08/29/17 0420 09/01/17 0046 09/02/17 0505 09/03/17 0403  WBC 10.9* 9.1 8.0 6.2  NEUTROABS  --   --  5.9 3.9  HGB 13.8 14.1 12.7* 12.6*  HCT 40.1 41.4 36.5* 36.2*  MCV 88.0 88.9 87.6 87.3  PLT 160 175 168 203    Cardiac Enzymes: No results for input(s): CKTOTAL, CKMB, CKMBINDEX, TROPONINI in the last 168 hours.  Lipid Panel: Recent Labs  Lab 08/30/17 0119 09/02/17 0505  TRIG 277* 177*    CBG: Recent Labs  Lab 09/03/17 1216 09/03/17 1232 09/03/17 1600 09/03/17 2244 09/04/17 3532  GLUCAP 207* 22* 51* 61* 38*    Microbiology: Results for orders placed or performed during the hospital encounter of 08/26/17  MRSA PCR Screening     Status: None   Collection Time: 08/27/17  3:24 AM  Result Value Ref Range Status   MRSA by PCR NEGATIVE NEGATIVE Final    Comment:        The GeneXpert MRSA Assay (FDA approved for NASAL specimens only), is one component of a comprehensive MRSA colonization surveillance program. It is not intended to diagnose MRSA infection nor to guide or monitor treatment for MRSA infections.   Culture, respiratory (NON-Expectorated)     Status: None   Collection Time: 09/01/17 12:30 AM  Result Value Ref Range Status   Specimen Description TRACHEAL ASPIRATE  Final   Special Requests NONE  Final   Gram Stain   Final    ABUNDANT WBC PRESENT, PREDOMINANTLY PMN RARE SQUAMOUS EPITHELIAL CELLS PRESENT ABUNDANT GRAM POSITIVE RODS FEW GRAM NEGATIVE RODS    Culture   Final    Consistent with normal  respiratory flora. Performed at [facility], [place] [address] [place], [place] [address]    Report Status 09/03/2017 FINAL  Final  Urine Culture     Status: None   Collection Time: 09/01/17 12:39 AM  Result Value Ref Range Status   Specimen Description URINE, RANDOM  Final   Special Requests NONE  Final   Culture   Final    NO GROWTH Performed at [facility], [place] [address], [place], [place] [address]    Report Status 09/02/2017 FINAL  Final  Culture, blood (Routine X 2) w Reflex to ID Panel     Status: None (Preliminary result)   Collection Time: 09/01/17 12:46 AM  Result Value Ref Range Status   Specimen Description BLOOD BLOOD LEFT HAND  Final   Special Requests   Final    BOTTLES DRAWN AEROBIC AND ANAEROBIC Blood Culture adequate volume   Culture NO GROWTH 3 DAYS  Final   Report Status PENDING  Incomplete  Culture, blood (Routine X 2) w Reflex to ID Panel     Status: None (Preliminary result)   Collection Time: 09/01/17  1:00 AM  Result Value Ref Range Status   Specimen Description BLOOD RIGHT ANTECUBITAL  Final   Special Requests   Final    BOTTLES DRAWN AEROBIC AND ANAEROBIC Blood Culture adequate volume   Culture NO GROWTH 3 DAYS  Final   Report Status PENDING  Incomplete    Coagulation Studies: No results for input(s): LABPROT, INR in the last 72 hours.  Imaging: No results found.  Medications:  I have reviewed the patient's current medications. Scheduled: . aspirin  81 mg Per Tube Daily  . bromocriptine  12.5 mg Oral Daily  . chlorhexidine  15 mL Mouth Rinse BID  . enoxaparin (LOVENOX) injection  40 mg Subcutaneous Q24H  . lisinopril  10 mg Oral Daily  . mouth rinse  15 mL Mouth Rinse q12n4p  . metoprolol tartrate  25 mg Oral BID  . sodium chloride flush  10-40 mL Intracatheter Q12H    Assessment/Plan: Patient more lethargic today.  No further seizures noted.  On Depakote.  Level today of 36.    Recommendations: 1.  Increase Depakote to  1000mg  BID 2. Depakote level in AM 3. Continue seizure precautions 4. If no improvement in MS by tomorrow will repeat  EEG   LOS: 8 days   Alexis Goodell, MD Neurology  [phone] 09/04/2017  12:38 PM

## 2017-09-04 NOTE — Progress Notes (Signed)
Pt febrile 102.1- APAP given supp x 1

## 2017-09-04 NOTE — Progress Notes (Signed)
[name] NAME: Andrew Horton    MR#:  [id number]  DATE OF BIRTH:  Dec 19, 1945  SUBJECTIVE:  Patient now extubated.   Off IV precedex gtt More awake and talking now Some concerns about suicidal thoughts expressed to RN Sitter+ REVIEW OF SYSTEMS:   Review of Systems  Constitutional: Negative for chills, fever and weight loss.  HENT: Negative for ear discharge, ear pain and nosebleeds.   Eyes: Negative for blurred vision, pain and discharge.  Respiratory: Negative for sputum production, shortness of breath, wheezing and stridor.   Cardiovascular: Negative for chest pain, palpitations, orthopnea and PND.  Gastrointestinal: Negative for abdominal pain, diarrhea, nausea and vomiting.  Genitourinary: Negative for frequency and urgency.  Musculoskeletal: Negative for back pain and joint pain.  Neurological: Positive for weakness. Negative for sensory change, speech change and focal weakness.  Psychiatric/Behavioral: Negative for depression and hallucinations. The patient is not nervous/anxious.     DRUG ALLERGIES:   Allergies  Allergen Reactions  . Sulfa Antibiotics Hives    VITALS:  Blood pressure (!) 129/54, pulse (!) 101, temperature 98.2 F (36.8 C), temperature source Oral, resp. rate 20, height 5\' 9"  (1.753 m), weight 103.6 kg (228 lb 6.3 oz), SpO2 96 %.  PHYSICAL EXAMINATION:   Physical Exam  GENERAL:  71 y.o.-year-old patient lying in the bed with no acute distress.  Critically illl EYES: Pupils equal, round, reactive to light and accommodation. No scleral icterus. Extraocular muscles intact.  HEENT: Head atraumatic, normocephalic. Oropharynx and nasopharynx clear.nECK:  Supple, no jugular venous distention. No thyroid enlargement, no tenderness.  LUNGS: Normal breath sounds bilaterally, no wheezing, rales, rhonchi. No use of accessory muscles of respiration.  CARDIOVASCULAR: S1, S2 normal. No murmurs, rubs, or  gallops.  ABDOMEN: Soft, nontender, nondistended.  No organomegaly or mass.  EXTREMITIES: No cyanosis, clubbing or edema b/l.    NEUROLOGIC: Opens eyes to verbal commands.  Moves all extremities spontaneously  pSYCHIATRIC: awake and alert SKIN: No obvious rash, lesion, or ulcer.   LABORATORY PANEL:  CBC Recent Labs  Lab 09/03/17 0403  WBC 6.2  HGB 12.6*  HCT 36.2*  PLT 203    Chemistries  Recent Labs  Lab 09/03/17 0403  NA 140  K 3.3*  CL 105  CO2 25  GLUCOSE 225*  BUN 17  CREATININE 0.86  CALCIUM 8.1*  MG 2.1  AST 40  ALT 38  ALKPHOS 57  BILITOT 0.4   Cardiac Enzymes No results for input(s): TROPONINI in the last 168 hours. RADIOLOGY:  No results found. ASSESSMENT AND PLAN:  71 year old male admitted for status epilepticus. 1.Acute Encephalopathy - Pt came in withStatus epilepticus: New onset seizures.  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 This is nonspecific but meningioma is favored - EEG with general background slowing but no evidence of electrographic seizure activity - neurology consult appreciated. - continue IV Keppra--- changed to IV Depakote (neurology managing the dose)  2. Acute respiratory failure: With hypoxemia; intubated for airway protection.  --Patient now extubated  3. Acute kidney injury: Hydrate with intravenous fluid. Avoid nephrotoxic agents.   -resolved  4. Hypertension: Continue current medications  5. Diabetes mellitus type 2:--NEW -Sliding scale insulin -Lantus and insulin aspart tid  6. Hyperlipidemia: Continue statin therapy  7. DVT prophylaxis: Heparin  8.?suicidal thoughts Sitter+ Psych consulted  PT recommends CIR CSW for d/c  planning  Case discussed with Care Management/Social Worker. Management plans discussed with the patient, family and they are in agreement.  CODE STATUS: full  DVT Prophylaxis:  Heparin  TOTAL TIME TAKING CARE OF THIS PATIENT: *25* minutes.  >50% time spent on counselling and coordination of care .  Note: This dictation was prepared with Dragon dictation along with smaller phrase technology. Any transcriptional errors that result from this process are unintentional.  Fritzi Mandes M.D on 09/04/2017   Between 7am to 6pm - Pager - [phone]  After 6pm go to [email or web address] - password [email or web address] [facility]  Office  [phone]  CC: Primary care physician; System, Pcp Not In

## 2017-09-04 NOTE — Progress Notes (Signed)
ANTIBIOTIC CONSULT NOTE - INITIAL  Pharmacy Consult for Ceftazidime  Indication: pneumonia  Allergies  Allergen Reactions  . Sulfa Antibiotics Hives    Patient Measurements: Height: 5\' 9"  (175.3 cm) Weight: 228 lb 6.3 oz (103.6 kg) IBW/kg (Calculated) : 70.7 Adjusted Body Weight:   Vital Signs: Temp: (P) 102.1 F (38.9 C) (11/28 1717) Temp Source: Axillary (11/28 1717) BP: 157/79 (11/28 1800) Pulse Rate: 117 (11/28 1800) Intake/Output from previous day: 11/27 0701 - 11/28 0700 In: 833.6 [I.V.:504.3; IV Piggyback:329.3] Out: 2325 [Urine:2325] Intake/Output from this shift: Total I/O In: 725 [P.O.:645; I.V.:30; IV Piggyback:50] Out: -   Labs: Recent Labs    09/02/17 0505 09/03/17 0403 09/04/17 1629  WBC 8.0 6.2  --   HGB 12.7* 12.6*  --   PLT 168 203  --   CREATININE 0.79 0.86 1.00   Estimated Creatinine Clearance: 80.4 mL/min (by C-G formula based on SCr of 1 mg/dL). No results for input(s): VANCOTROUGH, VANCOPEAK, VANCORANDOM, GENTTROUGH, GENTPEAK, GENTRANDOM, TOBRATROUGH, TOBRAPEAK, TOBRARND, AMIKACINPEAK, AMIKACINTROU, AMIKACIN in the last 72 hours.   Microbiology: Recent Results (from the past 720 hour(s))  MRSA PCR Screening     Status: None   Collection Time: 08/27/17  3:24 AM  Result Value Ref Range Status   MRSA by PCR NEGATIVE NEGATIVE Final    Comment:        The GeneXpert MRSA Assay (FDA approved for NASAL specimens only), is one component of a comprehensive MRSA colonization surveillance program. It is not intended to diagnose MRSA infection nor to guide or monitor treatment for MRSA infections.   Culture, respiratory (NON-Expectorated)     Status: None   Collection Time: 09/01/17 12:30 AM  Result Value Ref Range Status   Specimen Description TRACHEAL ASPIRATE  Final   Special Requests NONE  Final   Gram Stain   Final    ABUNDANT WBC PRESENT, PREDOMINANTLY PMN RARE SQUAMOUS EPITHELIAL CELLS PRESENT ABUNDANT GRAM POSITIVE RODS FEW GRAM  NEGATIVE RODS    Culture   Final    Consistent with normal respiratory flora. Performed at [facility], [place] [address], [place], [place] [address]    Report Status 09/03/2017 FINAL  Final  Urine Culture     Status: None   Collection Time: 09/01/17 12:39 AM  Result Value Ref Range Status   Specimen Description URINE, RANDOM  Final   Special Requests NONE  Final   Culture   Final    NO GROWTH Performed at [facility], [place] [address], [place], [place] [address]    Report Status 09/02/2017 FINAL  Final  Culture, blood (Routine X 2) w Reflex to ID Panel     Status: None (Preliminary result)   Collection Time: 09/01/17 12:46 AM  Result Value Ref Range Status   Specimen Description BLOOD BLOOD LEFT HAND  Final   Special Requests   Final    BOTTLES DRAWN AEROBIC AND ANAEROBIC Blood Culture adequate volume   Culture NO GROWTH 3 DAYS  Final   Report Status PENDING  Incomplete  Culture, blood (Routine X 2) w Reflex to ID Panel     Status: None (Preliminary result)   Collection Time: 09/01/17  1:00 AM  Result Value Ref Range Status   Specimen Description BLOOD RIGHT ANTECUBITAL  Final   Special Requests   Final    BOTTLES DRAWN AEROBIC AND ANAEROBIC Blood Culture adequate volume   Culture NO GROWTH 3 DAYS  Final   Report Status PENDING  Incomplete  Medical History: Past Medical History:  Diagnosis Date  . Brain tumor (benign) ([place])     Medications:  Medications Prior to Admission  Medication Sig Dispense Refill Last Dose  . atorvastatin (LIPITOR) 80 MG tablet Take 80 mg daily by mouth.   unknown at unknown  . bromocriptine (PARLODEL) 2.5 MG tablet Take 12.5 mg daily by mouth.   unknown at unknown  . clopidogrel (PLAVIX) 75 MG tablet Take 75 mg daily by mouth.   unknown at unknown  . linagliptin (TRADJENTA) 5 MG TABS tablet Take 5 mg daily by mouth.   unknown at unknown  . lisinopril (PRINIVIL,ZESTRIL) 5 MG tablet Take 5 mg daily by mouth.   unknown at unknown  .  metFORMIN (GLUCOPHAGE-XR) 500 MG 24 hr tablet Take 1,000 mg 2 (two) times daily by mouth.   unknown at unknown  . metoprolol succinate (TOPROL-XL) 25 MG 24 hr tablet Take 25 mg daily by mouth.   unknown at unknown  . nitroGLYCERIN (NITROSTAT) 0.4 MG SL tablet Place 0.4 mg every 5 (five) minutes as needed under the tongue for chest pain.   prn at prn   Scheduled:  . aspirin  81 mg Per Tube Daily  . bromocriptine  12.5 mg Oral Daily  . chlorhexidine  15 mL Mouth Rinse BID  . enoxaparin (LOVENOX) injection  40 mg Subcutaneous Q24H  . lisinopril  10 mg Oral Daily  . mouth rinse  15 mL Mouth Rinse q12n4p  . metoprolol tartrate  25 mg Oral BID  . sodium chloride flush  10-40 mL Intracatheter Q12H   Assessment: Pharmacy consulted to dose and monitor Ceftazidime in this 71 year old woman being treated for pneumonia.  Goal of Therapy:    Plan:  Will start Ceftazidime 2 g IV q8 hours.   Seleen Walter D 09/04/2017,6:21 PM

## 2017-09-04 NOTE — Progress Notes (Signed)
SLP Cancellation Note  Patient Details Name: Andrew Horton MRN: [id number] DOB: Jun 27, 1946   Cancelled treatment:       Reason Eval/Treat Not Completed: Medical issues which prohibited therapy;Fatigue/lethargy limiting ability to participate(chart reviewed; consulted NSG). NSG reported pt has been having increased tracheal/pharyngeal secretions which have required suctioning x1 today. He has eaten bites at his meal but is drinking the Nectar consistency liquids w/ no immediate, overt s/s of aspiration noted. Reviewed Labs, temps. Pt currently exhibits increased HR(105) and increased RR(30's) w/ O2 sats in the mid90's; pt's breathing is shallow. With this presentation, pt is not recommended for trials to upgrade diet consistency. Recommended continuing w/ current dysphagia diet w/ strict aspiration precautions and holding on any po's if pt's respiratory and medical status' are not stable. NSG is giving crushed Pills in puree w/out difficulty noted.  ST services will f/u tomorrow w/ pt's status and toleration of diet. NSG agreed.   Orinda Kenner, [place], CCC-SLP Estle Sabella 09/04/2017, 2:41 PM

## 2017-09-04 NOTE — Consult Note (Signed)
[facility] Consult   Reason for Consult:  Consult for 71 year old man in the intensive care unit with multiple medical problems recovering from status epilepticus. Concern about suicidal ideation Referring Physician:  Posey Pronto Patient Identification: Andrew Horton MRN:  [id number] Principal Diagnosis: Adjustment disorder with mixed anxiety and depressed mood Diagnosis:   Patient Active Problem List   Diagnosis Date Noted  . Adjustment disorder with mixed anxiety and depressed mood [F43.23] 09/04/2017  . Dysthymia [F34.1] 09/04/2017  . Acute respiratory failure ([place]) [J96.00]   . Status epilepticus ([place]) [G40.901] 08/27/2017    Total Time spent with patient: 1 hour  Subjective:   Andrew Horton is a 71 y.o. male patient admitted with patient is not able to state any history at this time.Marland Kitchen  HPI:  Patient seen. Chart reviewed. Reviewed old records from [facility] as well. Reviewed EKG. Spoke with nursing staff. This is a 71 year old man who is in the intensive care unit. He was first brought into the hospital with status epilepticus. This was eventually controlled with antiepileptic medication. 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 Today he was scheduled for transfer to the floor but just before I arrived he was put back on BiPAP because of desaturating from excessive secretions. Currently the patient had made a comment within the last day or so that indicated possible suicidal ideation. Does not appear to of acted out to try to kill himself. He currently has a Actuary. When I came to see the patient he was awake and appeared to be alert with eyes open. Made some eye contact with me. I introduce myself and attempted to explain the reason for my speaking with him. Patient either could not or would not reply to any questions even with a yes or no. I suggested several ways that he could try to indicate either yes or no responses to me but he did not do  any of them. Didn't appear to make any attempt to communicate. It appears that he has been cooperative with treatment so far. He is not on any psychiatric medication.  Social history: I'm told that the patient has very little social contact. The only known relative is a sister who lives in another country and has no contact with him. The main local contact appears to be a friend.  Medical history: Patient is overweight and has a history of diabetes. History of elevated prolactin. Now has developed seizures with status epilepticus possibly related to finding on MRI. Patient's condition is being complicated by his respiratory difficulty. He has frequent secretions that are causing him to have trouble breathing and desaturating that has kept him in the ICU.  Substance abuse history: Patient couldn't answer any questions about this but there is nothing in the current or old chart about it. Looking back over psychiatric and medical dose C I don't see any mention of concern about it.  Past Psychiatric History: I was able to get some information about this from his old notes at [facility]. There was one psychiatric interview with his primary care doctor from 2017. It was noted at that time the patient has chronic anxiety and depression symptoms. He has had cognitive behavioral therapy in the past. It is reported that he had 1 prior inpatient hospitalization apparently years ago. It was reported that he had never tried to kill himself although he had had chronic vague suicidal ideation without intent. PTSD was mention but not described in enough detail  to be confident of the diagnosis. Apparently the patient has refused or just not been prescribed psychiatric medicine past. No known suicide attempts.  Risk to Self: Is patient at risk for suicide?: No Risk to Others:   Prior Inpatient Therapy:   Prior Outpatient Therapy:    Past Medical History:  Past Medical History:  Diagnosis Date  . Brain tumor (benign) ([place])     The histories are not reviewed yet. Please review them in the "History" navigator section and refresh this [place]. Family History: History reviewed. No pertinent family history. Family Psychiatric  History: There is no information available at all about this Social History:  Social History   Substance and Sexual Activity  Alcohol Use No  . Frequency: Never     Social History   Substance and Sexual Activity  Drug Use No    Social History   Socioeconomic History  . Marital status: Single    Spouse name: None  . Number of children: None  . Years of education: None  . Highest education level: None  Social Needs  . Financial resource strain: None  . Food insecurity - worry: None  . Food insecurity - inability: None  . Transportation needs - medical: None  . Transportation needs - non-medical: None  Occupational History  . None  Tobacco Use  . Smoking status: Never Smoker  . Smokeless tobacco: Never Used  Substance and Sexual Activity  . Alcohol use: No    Frequency: Never  . Drug use: No  . Sexual activity: None  Other Topics Concern  . None  Social History Narrative  . None   Additional Social History:    Allergies:   Allergies  Allergen Reactions  . Sulfa Antibiotics Hives    Labs:  Results for orders placed or performed during the hospital encounter of 08/26/17 (from the past 48 hour(s))  Potassium     Status: Abnormal   Collection Time: 09/02/17  6:11 PM  Result Value Ref Range   Potassium 3.2 (L) 3.5 - 5.1 mmol/L  Phosphorus     Status: Abnormal   Collection Time: 09/02/17  6:11 PM  Result Value Ref Range   Phosphorus 2.0 (L) 2.5 - 4.6 mg/dL  Glucose, capillary     Status: Abnormal   Collection Time: 09/02/17  7:47 PM  Result Value Ref Range   Glucose-Capillary 195 (H) 65 - 99 mg/dL  Glucose, capillary     Status: Abnormal   Collection Time: 09/02/17 11:30 PM  Result Value Ref Range   Glucose-Capillary 224 (H) 65 - 99 mg/dL  Glucose,  capillary     Status: Abnormal   Collection Time: 09/03/17  3:51 AM  Result Value Ref Range   Glucose-Capillary 205 (H) 65 - 99 mg/dL  Comprehensive metabolic panel     Status: Abnormal   Collection Time: 09/03/17  4:03 AM  Result Value Ref Range   Sodium 140 135 - 145 mmol/L   Potassium 3.3 (L) 3.5 - 5.1 mmol/L   Chloride 105 101 - 111 mmol/L   CO2 25 22 - 32 mmol/L   Glucose, Bld 225 (H) 65 - 99 mg/dL   BUN 17 6 - 20 mg/dL   Creatinine, Ser 0.86 0.61 - 1.24 mg/dL   Calcium 8.1 (L) 8.9 - 10.3 mg/dL   Total Protein 5.8 (L) 6.5 - 8.1 g/dL   Albumin 2.4 (L) 3.5 - 5.0 g/dL   AST 40 15 - 41 U/L   ALT 38 17 - 63  U/L   Alkaline Phosphatase 57 38 - 126 U/L   Total Bilirubin 0.4 0.3 - 1.2 mg/dL   GFR calc non Af Amer >60 >60 mL/min   GFR calc Af Amer >60 >60 mL/min    Comment: (NOTE) The eGFR has been calculated using the CKD EPI equation. This calculation has not been validated in all clinical situations. eGFR's persistently <60 mL/min signify possible Chronic Kidney Disease.    Anion gap 10 5 - 15  CBC with Differential/Platelet     Status: Abnormal   Collection Time: 09/03/17  4:03 AM  Result Value Ref Range   WBC 6.2 3.8 - 10.6 K/uL   RBC 4.15 (L) 4.40 - 5.90 MIL/uL   Hemoglobin 12.6 (L) 13.0 - 18.0 g/dL   HCT 36.2 (L) 40.0 - 52.0 %   MCV 87.3 80.0 - 100.0 fL   MCH 30.3 26.0 - 34.0 pg   MCHC 34.7 32.0 - 36.0 g/dL   RDW 12.7 11.5 - 14.5 %   Platelets 203 150 - 440 K/uL   Neutrophils Relative % 62 %   Neutro Abs 3.9 1.4 - 6.5 K/uL   Lymphocytes Relative 25 %   Lymphs Abs 1.6 1.0 - 3.6 K/uL   Monocytes Relative 8 %   Monocytes Absolute 0.5 0.2 - 1.0 K/uL   Eosinophils Relative 4 %   Eosinophils Absolute 0.3 0 - 0.7 K/uL   Basophils Relative 1 %   Basophils Absolute 0.1 0 - 0.1 K/uL  Valproic acid level     Status: Abnormal   Collection Time: 09/03/17  4:03 AM  Result Value Ref Range   Valproic Acid Lvl 27 (L) 50.0 - 100.0 ug/mL  Phosphorus     Status: None    Collection Time: 09/03/17  4:03 AM  Result Value Ref Range   Phosphorus 3.1 2.5 - 4.6 mg/dL  Magnesium     Status: None   Collection Time: 09/03/17  4:03 AM  Result Value Ref Range   Magnesium 2.1 1.7 - 2.4 mg/dL  Glucose, capillary     Status: Abnormal   Collection Time: 09/03/17  7:48 AM  Result Value Ref Range   Glucose-Capillary 217 (H) 65 - 99 mg/dL  Glucose, capillary     Status: Abnormal   Collection Time: 09/03/17 12:16 PM  Result Value Ref Range   Glucose-Capillary 207 (H) 65 - 99 mg/dL  Glucose, capillary     Status: Abnormal   Collection Time: 09/03/17 12:32 PM  Result Value Ref Range   Glucose-Capillary 211 (H) 65 - 99 mg/dL  Glucose, capillary     Status: Abnormal   Collection Time: 09/03/17  4:00 PM  Result Value Ref Range   Glucose-Capillary 198 (H) 65 - 99 mg/dL  Glucose, capillary     Status: Abnormal   Collection Time: 09/03/17 10:44 PM  Result Value Ref Range   Glucose-Capillary 194 (H) 65 - 99 mg/dL   Comment 1 Notify RN   Glucose, capillary     Status: Abnormal   Collection Time: 09/04/17  7:33 AM  Result Value Ref Range   Glucose-Capillary 258 (H) 65 - 99 mg/dL  Glucose, capillary     Status: Abnormal   Collection Time: 09/04/17  1:06 PM  Result Value Ref Range   Glucose-Capillary 309 (H) 65 - 99 mg/dL  Valproic acid level     Status: Abnormal   Collection Time: 09/04/17  3:12 PM  Result Value Ref Range   Valproic Acid Lvl 36 (L) 50.0 -  100.0 ug/mL    Current Facility-Administered Medications  Medication Dose Route Frequency Provider Last Rate Last Dose  . acetaminophen (TYLENOL) tablet 650 mg  650 mg Oral Q6H PRN Harrie Foreman, MD       Or  . acetaminophen (TYLENOL) suppository 650 mg  650 mg Rectal Q6H PRN Harrie Foreman, MD   650 mg at 09/02/17 0030  . aspirin chewable tablet 81 mg  81 mg Per Tube Daily Wilhelmina Mcardle, MD   81 mg at 09/04/17 1059  . bromocriptine (PARLODEL) tablet 12.5 mg  12.5 mg Oral Daily Wilhelmina Mcardle, MD    12.5 mg at 09/04/17 1059  . chlorhexidine (PERIDEX) 0.12 % solution 15 mL  15 mL Mouth Rinse BID Fritzi Mandes, MD   15 mL at 09/04/17 0927  . enoxaparin (LOVENOX) injection 40 mg  40 mg Subcutaneous Q24H Fritzi Mandes, MD   40 mg at 09/04/17 7062  . hydrALAZINE (APRESOLINE) injection 10 mg  10 mg Intravenous Q2H PRN Flora Lipps, MD   10 mg at 09/04/17 0055  . insulin aspart (novoLOG) injection 0-15 Units  0-15 Units Subcutaneous TID WC Flora Lipps, MD   11 Units at 09/04/17 1318  . insulin aspart (novoLOG) injection 0-5 Units  0-5 Units Subcutaneous QHS Flora Lipps, MD      . insulin glargine (LANTUS) injection 10 Units  10 Units Subcutaneous Daily Fritzi Mandes, MD   10 Units at 09/04/17 1320  . lisinopril (PRINIVIL,ZESTRIL) tablet 10 mg  10 mg Oral Daily Fritzi Mandes, MD   10 mg at 09/04/17 1056  . LORazepam (ATIVAN) injection 2 mg  2 mg Intravenous PRN Wilhelmina Mcardle, MD   2 mg at 09/04/17 0228  . MEDLINE mouth rinse  15 mL Mouth Rinse q12n4p Fritzi Mandes, MD   15 mL at 09/03/17 1620  . metoprolol tartrate (LOPRESSOR) tablet 25 mg  25 mg Oral BID Fritzi Mandes, MD      . ondansetron [facility]) injection 4 mg  4 mg Intravenous Q6H PRN Harrie Foreman, MD      . sodium chloride flush (NS) 0.9 % injection 10-40 mL  10-40 mL Intracatheter Q12H Awilda Bill, NP   30 mL at 09/04/17 1100  . sodium chloride flush (NS) 0.9 % injection 10-40 mL  10-40 mL Intracatheter PRN Awilda Bill, NP      . valproate (DEPACON) 750 mg in dextrose 5 % 50 mL IVPB  750 mg Intravenous Q12H Alexis Goodell, MD   Stopped at 09/04/17 1050    Musculoskeletal: Strength & Muscle Tone: decreased Gait & Station: unable to stand Patient leans: N/A  Psychiatric Specialty Exam: Physical Exam  Nursing note and vitals reviewed. Constitutional: He appears well-developed and well-nourished. He appears distressed.  HENT:  Head: Normocephalic and atraumatic.  Eyes: Conjunctivae are normal. Pupils are equal, round, and  reactive to light.  Neck: Normal range of motion.  Cardiovascular: Normal heart sounds.  Respiratory: He is in respiratory distress.  GI: Soft.  Musculoskeletal: Normal range of motion.  Neurological: He is alert.  Skin: Skin is warm and dry.  Psychiatric: His affect is blunt. He is slowed. He is noncommunicative.    Review of Systems  Unable to perform ROS: Medical condition    Blood pressure (!) 143/93, pulse (!) 122, temperature (!) 97 F (36.1 C), temperature source Oral, resp. rate (!) 46, height '5\' 9"'$  (1.753 m), weight 103.6 kg (228 lb 6.3 oz), SpO2 98 %.Body mass index  is 33.73 kg/m.  General Appearance: Casual  Eye Contact:  Fair  Speech:  Negative  Volume:  Decreased  Mood:  Negative  Affect:  Negative  Thought Process:  NA  Orientation:  Negative  Thought Content:  Negative  Suicidal Thoughts:  Unknown but no evidence that he is acting out to try to harm himself or even attempting to refuse treatment  Homicidal Thoughts:  No  Memory:  Negative  Judgement:  Negative  Insight:  Negative  Psychomotor Activity:  Negative  Concentration:  Concentration: Negative  Recall:  Negative  Fund of Knowledge:  Negative  Language:  Negative  Akathisia:  Negative  Handed:  Right  AIMS (if indicated):     Assets:  Financial Resources/Insurance  ADL's:  Impaired  Cognition:  Impaired,  Moderate  Sleep:        Treatment Plan Summary: Daily contact with patient to assess and evaluate symptoms and progress in treatment, Medication management and Plan 71 year old man with severe medical problems. Reportedly made a suicidal statement. Currently not possible to perform any kind of detailed direct examination although the evidence right now would suggest that he is not actively trying to harm himself. Patient does have a past history of some psychiatric consideration but no history of suicide attempts in the past. Judging from the chart, the fact that he has a suicide sitter is  inhibiting attempts at getting him placed in inpatient rehabilitation. Under the circumstances I think the sitter can be safely discontinued. I think the chances that the sitter is going to improve his safety are low and are outweighed particularly by the possibility that he might be able to get other rehabilitation options if he does not have a suicide sitter. Right now he is not in any physical condition to do anything too seriously impair all himself. This can be reassessed on a day-to-day basis in any case and can always be reversed. Right now he is probably still too encephalopathic based on previous notes 2 have a lucid conversation. Based on all of this I am discontinuing the sitter. I am on the fence about recommending medication for depression. It is documented that he has refused medicine in the past right now he does not appear to be capable of making medical decisions. He does have a history of dysthymia and the seizures and medical problems make depression more likely. All in all I think it would probably be a better decision to put him on antidepressants but we can wait another day while he medically stabilizes. I will continue to follow-up as needed. Case reviewed with nursing.  Disposition: See note above. Discontinued sitter. Justification described above. I will continue to follow-up.  Alethia Berthold, MD 09/04/2017 4:10 PM

## 2017-09-04 NOTE — Care Management (Addendum)
I spoke with patient's friend Annie Main [phone]- Annie Main works on his job at [employer] residence and he can check on him "frequently" and will stay with him at night. Annie Main asks that he will need a work note with a length of time this is potentially needed.  Annie Main said he will bring in patient's "bag of medications".   Per Annie Main, "His next of kin is a sister that is [place]. They do not have a good relationship. He has not talked to her in 25 years".

## 2017-09-04 NOTE — Progress Notes (Signed)
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 Intubated in ED on 11/19, extubated 11/20 AM. Persistent encephalopathy requiring re-intubation 11/21 early AM and self extubated on 11/24. 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 Seen for OT and PT evaluations on 11/27.    PT Comments    Pt lethargic this session with extensive cues and encouragement required for participation, nursing notified.  Pt able to follow 1-step commands during session with increased time for processing.  Pt required +2 Mod A with elevated HOB for sup to/from sit bed mobility training.  Pt able to sit at EOB with close SBA with occasional R lateral lean but able to self-correct.  Transfers not attempted this session secondary to pt's decreased level of alertness and ability to follow commands.  Pt will benefit from PT services at [facility] or in a SNF setting if does not qualify for CIR upon discharge to safely address above deficits for decreased caregiver assistance and eventual return to PLOF.     Follow Up Recommendations  CIR or SNF if pt does not qualify for CIR     Equipment Recommendations  Other (comment)(TBD at next venue of care)    Recommendations for Other Services       Precautions / Restrictions Precautions Precautions: Fall Restrictions Weight Bearing Restrictions: No    Mobility  Bed Mobility Overal bed mobility: Needs Assistance Bed Mobility: Supine to Sit;Sit to Supine;Rolling Rolling: Mod assist   Supine to sit: Mod assist;+2 for physical assistance;HOB elevated Sit to supine: +2 for physical assistance;Mod assist   General bed mobility comments: Pt required extensive cues  for sequencing and effort.  No c/o vertigo like symptoms during bed mobility this session.  Transfers                 General transfer comment: NT for pt safety secondary to pt lethargic with decreased participation during session.  Ambulation/Gait             General Gait Details: NT   Stairs            Wheelchair Mobility    Modified Rankin (Stroke Patients Only)       Balance Overall balance assessment: Needs assistance Sitting-balance support: Single extremity supported Sitting balance-Leahy Scale: Fair Sitting balance - Comments: fair with intermittent R lateral lean noted and pt self corrected after verbal cues Postural control: Right lateral lean   Standing balance-Leahy Scale: Poor                              Cognition Arousal/Alertness: Lethargic Behavior During Therapy: Flat affect Overall Cognitive Status: No family/caregiver present to determine baseline cognitive functioning                         Following Commands: Follows one step commands with increased time     Problem Solving: Slow processing        Exercises Total Joint Exercises Ankle Circles/Pumps: AAROM;Both;5 reps;10 reps Quad Sets: AAROM;Both;5 reps;10 reps Short Arc Quad: AAROM;Both;5 reps;10 reps Heel Slides: AAROM;Both;5 reps;10 reps Hip ABduction/ADduction: AAROM;Both;5 reps;10 reps Straight Leg Raises: AAROM;Both;5 reps;10 reps Long Arc Quad:  AAROM;Both;10 reps Knee Flexion: AAROM;Both;10 reps Other Exercises Other Exercises: Unsupported sitting at EOB for core therex and sitting balance.    General Comments        Pertinent Vitals/Pain Pain Assessment: No/denies pain    Home Living                      Prior Function            PT Goals (current goals can now be found in the care plan section) Progress towards PT goals: Not progressing toward goals - comment(Pt lethargic with decreased participation this session)     Frequency    Min 2X/week      PT Plan Current plan remains appropriate    Co-evaluation              AM-PAC PT "6 Clicks" Daily Activity  Outcome Measure                   End of Session Equipment Utilized During Treatment: Oxygen Activity Tolerance: Patient limited by fatigue;Patient limited by lethargy Patient left: in bed;with call bell/phone within reach;with nursing/sitter in room;with bed alarm set Nurse Communication: Mobility status;Other (comment)(Pt's level of alertness) PT Visit Diagnosis: Muscle weakness (generalized) (M62.81);Dizziness and giddiness (R42);Difficulty in walking, not elsewhere classified (R26.2)     Time: 2122-4825 PT Time Calculation (min) (ACUTE ONLY): 23 min  Charges:  $Therapeutic Exercise: 8-22 mins $Therapeutic Activity: 8-22 mins                    G Codes:       DRoyetta Asal PT, DPT 09/04/17, 1:14 PM

## 2017-09-04 NOTE — Progress Notes (Signed)
OT Cancellation Note  Patient Details Name: Andrew Horton MRN: [id number] DOB: May 21, 1946   Cancelled Treatment:    Reason Eval/Treat Not Completed: Medical issues which prohibited therapy. Upon attempt, RN noting pt is not appropriate for OT at this time. Pt went into respiratory distress and placed back in Bi-PAP, FiO2 30%. Will re-attempt next date as appropriate.  Jeni Salles, MPH, MS, OTR/L ascom [phone] 09/04/17, 4:16 PM

## 2017-09-04 NOTE — Progress Notes (Signed)
Nutrition Follow-up  DOCUMENTATION CODES:   Obesity unspecified  INTERVENTION:  Provide Hormel Shake BID with lunch and dinner trays, each supplement provides 520 kcal and 22 grams of protein.  Provide Magic cup TID with meals, each supplement provides 290 kcal and 9 grams of protein.  NUTRITION DIAGNOSIS:   Inadequate oral intake related to poor appetite, lethargy/confusion as evidenced by per patient/family report, meal completion < 50%.  New nutrition diagnosis.  GOAL:   Patient will meet greater than or equal to 90% of their needs  Not met.  MONITOR:   PO intake, Supplement acceptance, Labs, Weight trends, I & O's  REASON FOR ASSESSMENT:   Consult, Ventilator New TPN/TNA  ASSESSMENT:   71 year old male with PMHx of benign brain tumor presents with new onset seizure after being found in postictal state in bathroom of a gas station who was emergently intubated late on 11/19 and then extubated AM of 11/20. Patient had persistent encephalopathy following extubation which required re-intubation early AM of 11/21.  -Following SLP evaluation 11/27 patient was advanced to dysphagia 1 diet with nectar-thick liquids. -As patient advanced to a diet yesterday, TPN was discontinued.  Met with patient at bedside. He is more alert and able to communicate. Still somewhat confused. He reports he is tolerating his meals but confirms he is not eating much. He reports his appetite is not good.  Medications reviewed and include: Novolog 0-15 units TID, Novolog 0-5 units QHS, Lantus 10 units daily, Depacon.  Labs reviewed: CBG 194-258 past 24 hrs. On 11/27 Potassium 3.3. Phosphorus and Magnesium were WNL on 11/27.  I/O: 2325 mL UOP yesterday (0.9 mL/kg/hr) + one unmeasured output; 2 BMs yesterday  Weight trend: 103.6 kg 11/28; -0.7 kg from 11/20  Discussed with RN. He only had a small amount of breakfast this morning but did pretty well with thickened water.  Diet Order:  DIET - DYS  1 Room service appropriate? Yes with Assist; Fluid consistency: Nectar Thick  EDUCATION NEEDS:   Not appropriate for education at this time  Skin:  Skin Assessment: Reviewed RN Assessment  Last BM:  09/03/2017 - small type 4  Height:   Ht Readings from Last 1 Encounters:  08/27/17 '5\' 9"'$  (1.753 m)    Weight:   Wt Readings from Last 1 Encounters:  09/04/17 228 lb 6.3 oz (103.6 kg)    Ideal Body Weight:  72.7 kg  BMI:  Body mass index is 33.73 kg/m.  Estimated Nutritional Needs:   Kcal:  1960-2140 (MSJ x 1.1-1.2; MSJ=1781)  Protein:  100-120 grams (1-1.2 grams/kg)  Fluid:  1.8-2.2 L/day (25-30 mL/kg IBW)  Willey Blade, MS, RD, LDN Office: [phone] Pager: [phone] After Hours/Weekend Pager: [phone]

## 2017-09-04 NOTE — Progress Notes (Signed)
I contacted RN CM to further discuss case. Pt currently not a candidate for an inpt rehab admission . I will follow his case at a distance for his progress and to assist with dispo. If team continues to feel pt to be a possible inpt rehab candidate, I will discuss with my Rehab Physician to clarify if pt would be a possible inpt rehab candidate. [phone]

## 2017-09-04 NOTE — Care Management (Signed)
Patient declined by [facility] Inpatient Rehab based on 1:1 sitter requirement and that patient lives alone. Per Danne Baxter RN with CIR- she recommended that I try [facility] inpatient rehab  [phone] fax [phone]. I was told the same thing by [facility]. I attempted to reach out to Jonna Munro [phone] but was unable to leave message/no answer. I have again reached back out to CIR.

## 2017-09-05 ENCOUNTER — Inpatient Hospital Stay: Payer: Medicare Other

## 2017-09-05 LAB — GLUCOSE, CAPILLARY
Glucose-Capillary: 122 mg/dL — ABNORMAL HIGH (ref 65–99)
Glucose-Capillary: 126 mg/dL — ABNORMAL HIGH (ref 65–99)
Glucose-Capillary: 138 mg/dL — ABNORMAL HIGH (ref 65–99)
Glucose-Capillary: 148 mg/dL — ABNORMAL HIGH (ref 65–99)
Glucose-Capillary: 151 mg/dL — ABNORMAL HIGH (ref 65–99)
Glucose-Capillary: 155 mg/dL — ABNORMAL HIGH (ref 65–99)
Glucose-Capillary: 161 mg/dL — ABNORMAL HIGH (ref 65–99)
Glucose-Capillary: 161 mg/dL — ABNORMAL HIGH (ref 65–99)
Glucose-Capillary: 180 mg/dL — ABNORMAL HIGH (ref 65–99)
Glucose-Capillary: 194 mg/dL — ABNORMAL HIGH (ref 65–99)
Glucose-Capillary: 194 mg/dL — ABNORMAL HIGH (ref 65–99)
Glucose-Capillary: 198 mg/dL — ABNORMAL HIGH (ref 65–99)
Glucose-Capillary: 285 mg/dL — ABNORMAL HIGH (ref 65–99)
Glucose-Capillary: 324 mg/dL — ABNORMAL HIGH (ref 65–99)

## 2017-09-05 LAB — URINALYSIS, COMPLETE (UACMP) WITH MICROSCOPIC
Bilirubin Urine: NEGATIVE
Glucose, UA: 50 mg/dL — AB
Ketones, ur: 5 mg/dL — AB
Nitrite: NEGATIVE
PH: 5 (ref 5.0–8.0)
Protein, ur: 30 mg/dL — AB
SPECIFIC GRAVITY, URINE: 1.018 (ref 1.005–1.030)

## 2017-09-05 LAB — VALPROIC ACID LEVEL: VALPROIC ACID LVL: 39 ug/mL — AB (ref 50.0–100.0)

## 2017-09-05 MED ORDER — METOPROLOL TARTRATE 5 MG/5ML IV SOLN: 5.0000 mg | Freq: Four times a day (QID) | INTRAVENOUS | Status: DC | PRN

## 2017-09-05 MED ORDER — VALPROATE SODIUM 500 MG/5ML IV SOLN
1000.0000 mg | Freq: Two times a day (BID) | INTRAVENOUS | Status: DC
  Administered 2017-09-05 – 2017-09-06 (×2): 1000 mg via INTRAVENOUS
  Filled 2017-09-05 (×4): qty 10

## 2017-09-05 MED ORDER — INSULIN GLARGINE 100 UNIT/ML ~~LOC~~ SOLN
40.0000 [IU] | Freq: Every day | SUBCUTANEOUS | Status: DC
  Administered 2017-09-05 – 2017-09-06 (×2): 40 [IU] via SUBCUTANEOUS
  Filled 2017-09-05 (×2): qty 0.4

## 2017-09-05 MED ORDER — INSULIN ASPART 100 UNIT/ML ~~LOC~~ SOLN
0.0000 [IU] | Freq: Every day | SUBCUTANEOUS | Status: DC
  Administered 2017-09-05: 4 [IU] via SUBCUTANEOUS
  Filled 2017-09-05: qty 1

## 2017-09-05 MED ORDER — QUETIAPINE FUMARATE 25 MG PO TABS
25.0000 mg | ORAL_TABLET | Freq: Once | ORAL | Status: AC
  Administered 2017-09-05: 25 mg via ORAL
  Filled 2017-09-05: qty 1

## 2017-09-05 MED ORDER — ASPIRIN 81 MG PO CHEW
81.0000 mg | CHEWABLE_TABLET | Freq: Every day | ORAL | Status: DC
  Administered 2017-09-05: 81 mg via ORAL
  Filled 2017-09-05 (×2): qty 1

## 2017-09-05 MED ORDER — INSULIN ASPART 100 UNIT/ML ~~LOC~~ SOLN
0.0000 [IU] | Freq: Three times a day (TID) | SUBCUTANEOUS | Status: DC
  Administered 2017-09-05: 11 [IU] via SUBCUTANEOUS
  Administered 2017-09-05: 4 [IU] via SUBCUTANEOUS
  Administered 2017-09-06: 11 [IU] via SUBCUTANEOUS
  Filled 2017-09-05 (×3): qty 1

## 2017-09-05 MED ORDER — METOPROLOL TARTRATE 50 MG PO TABS
50.0000 mg | ORAL_TABLET | Freq: Two times a day (BID) | ORAL | Status: DC
  Administered 2017-09-05: 50 mg via ORAL
  Filled 2017-09-05: qty 1

## 2017-09-05 MED ORDER — LISINOPRIL 20 MG PO TABS
20.0000 mg | ORAL_TABLET | Freq: Every day | ORAL | Status: DC
  Administered 2017-09-05: 20 mg via ORAL
  Filled 2017-09-05 (×2): qty 1

## 2017-09-05 MED ORDER — DIVALPROEX SODIUM 500 MG PO DR TAB
1000.0000 mg | DELAYED_RELEASE_TABLET | Freq: Two times a day (BID) | ORAL | Status: DC
  Filled 2017-09-05: qty 2

## 2017-09-05 MED ORDER — CEFTAZIDIME AND DEXTROSE 2-5 GM-%(50ML) IV SOLR
2.0000 g | Freq: Three times a day (TID) | INTRAVENOUS | Status: DC
  Administered 2017-09-05 – 2017-09-07 (×5): 2 g via INTRAVENOUS
  Filled 2017-09-05 (×7): qty 50

## 2017-09-05 NOTE — Progress Notes (Signed)
Pt bs rhonchi, pt has strong cough but refusing to cough at request, pt refusing to have mouth suctioned.

## 2017-09-05 NOTE — Progress Notes (Signed)
Patient has been on room air during shift until this last hour and desated into mid 80's and oxygen sats would not come up when placed on 5 L nasal cannula therefore placed on venti mask at 40% and Iona Beard, RRT came to room and NT suctioned patient because patient has a very weak cough and is congested.  o2 sats now 90% on venti mask with no respiratory distress.

## 2017-09-05 NOTE — Progress Notes (Signed)
Occupational Therapy Treatment Patient Details Name: Andrew Horton MRN: [id number] DOB: [birth year] Today's Date: 09/05/2017    History of present illness 71yo male with PMHx or prolactinoma, no prior seizure history, admitted with status epilepticus and acute encephalopathy. Intubated in ED on 11/19, extubated 11/20 AM. Persistent encephalopathy requiring re-intubation 11/21 early AM and self extubated on 11/24. 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 Seen for OT and PT evaluations on 11/27.   OT comments  Pt seen for OT Treatment session this date. Pt fatigued, but agreeable to OT session. Supported pt in drinking 2 nectar thick drinks with set up and min guard with max VC to take slow sips to minimize risk of aspiration. No spillage noted, sufficient grip strength to hold small cup in R hand and bring to mouth with min guard for safety. Pt requesting cottage cheese, SLP notified to confirm compliance with dietary restrictions, SLP noted she would call dining to have it brought up. Pt participated briefly in BUE exercises. Continues to be mildly confused/disoriented with limited insights into deficits (asks for soda several times despite education on dietary precautions). Pt able to recall long term memories but unable to recall events leading to hospitalization. Will continue to progress.   Follow Up Recommendations  CIR    Equipment Recommendations  Other (comment)(TBD)    Recommendations for Other Services Rehab consult    Precautions / Restrictions Precautions Precautions: Fall Restrictions Weight Bearing Restrictions: No       Mobility Bed Mobility  Transfers     Balance                                   ADL either performed or assessed with clinical judgement   ADL Overall ADL's : Needs assistance/impaired Eating/Feeding: Set up;Supervision/ safety;Cueing  for safety;Bed level Eating/Feeding Details (indicate cue type and reason): nectar thick liquids, min guard after set up with mod verbal cues to take small sips to drink 2 nectar thick drinks, noted wet/gurgly throat afterwards, VSS, no distress note; notified SLP, pt requesting cottage cheese, SLP to order from dining                                         Vision Baseline Vision/History: Wears glasses Wears Glasses: At all times Patient Visual Report: No change from baseline Vision Assessment?: No apparent visual deficits   Perception     Praxis      Cognition Arousal/Alertness: Awake/alert Behavior During Therapy: WFL for tasks assessed/performed Overall Cognitive Status: Impaired/Different from baseline Area of Impairment: Orientation;Safety/judgement;Memory                 Orientation Level: Disoriented to;Time;Situation   Memory: Decreased short-term memory   Safety/Judgement: Decreased awareness of safety;Decreased awareness of deficits              Exercises Other Exercises Other Exercises: Pt instructed in BUE AROM exercises, max VC for encouragement, as pt very fatigued, ultimately did perform x5 shoulder flexion bilaterally with difficulty lifting arms off bed    Shoulder Instructions       General Comments      Pertinent Vitals/ Pain       Pain Assessment: Faces Faces Pain Scale: Hurts even more  Pain Location: lungs Pain Intervention(s): Limited activity within patient's tolerance;Monitored during session  Home Living                                          Prior Functioning/Environment              Frequency  Min 3X/week        Progress Toward Goals  OT Goals(current goals can now be found in the care plan section)  Progress towards OT goals: Progressing toward goals  Acute Rehab OT Goals Patient Stated Goal: get better OT Goal Formulation: With patient Time For Goal Achievement:  09/17/17 Potential to Achieve Goals: Good  Plan Discharge plan remains appropriate;Frequency remains appropriate    Co-evaluation                 AM-PAC PT "6 Clicks" Daily Activity     Outcome Measure   Help from another person eating meals?: A Little Help from another person taking care of personal grooming?: A Little Help from another person toileting, which includes using toliet, bedpan, or urinal?: A Lot Help from another person bathing (including washing, rinsing, drying)?: A Lot Help from another person to put on and taking off regular upper body clothing?: A Lot Help from another person to put on and taking off regular lower body clothing?: A Lot 6 Click Score: 14    End of Session    OT Visit Diagnosis: Other abnormalities of gait and mobility (R26.89);Other symptoms and signs involving cognitive function;Muscle weakness (generalized) (M62.81)   Activity Tolerance Patient tolerated treatment well   Patient Left in bed;with call bell/phone within reach;with bed alarm set;with nursing/sitter in room   Nurse Communication          Time: 2683-4196 OT Time Calculation (min): 30 min  Charges: OT General Charges $OT Visit: 1 Visit OT Treatments $Self Care/Home Management : 23-37 mins  Jeni Salles, MPH, MS, OTR/L ascom [phone] 09/05/17, 2:30 PM

## 2017-09-05 NOTE — Consult Note (Signed)
PULMONARY / CRITICAL CARE MEDICINE   Name: Andrew Horton MRN: [id number] DOB: [birth year]    ADMISSION DATE:  08/26/2017 CONSULTATION DATE: 08/26/2017  REFERRING MD: Dr. Marcille Blanco   CHIEF COMPLAINT: Seizure Activity   PT PROFILE: 13 M with hx or prolactinoma, no prior seizure history, admitted with status epilepticus and acute encephalopathy. Intubated in ED. Extubated 11/20 AM. Persistent encephalopathy requiring re-intubation 11/21 early AM.   MAJOR EVENTS/TEST RESULTS: 11/19 admission via EMS> ED with status epilepticus. Intubated in ED. Treated with phenytoin, then Keprpa 11/19 CTH: No acute findings 11/20 Extubated. Persistent encephalopathy > at times agitated. Dexmedetomidine ordered.  11/20 Neurology consultation: serum prolactin ordered. MRI brain, EEG ordered. Keppra continued 11/20 EEG:  general background slowing.  This finding may be seen with a diffuse disturbance that is etiologically nonspecific, but may include a post-ictal state, among other possibilities.  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 No definite extension outside of the left cavernous sinus 11/24 Self extubated 11/25 Encephalopathic, tenuous respiratory status due to poor cough mechanics 11/26 Keppra changed to VPA 11/27 Encephalopathy much improved.  Dexmedetomidine discontinued.  Oriented to person and place. SLP eval and PT/OT ordered 11/28 Continued improvement. Transfer to MedSurg floor ordered but then canceled due to severe hyperglycemia and insulin infusion 11/29 insulin infusion transitioned to Lantus plus SSI.  Intermittent delirium.  Remains very weak, unable to stand  INDWELLING DEVICES:: ETT 11/19 >> 11/20, 11/21 >> 11/24 (self extubated) R IJ CVL 11/21 >>  11/28  MICRO DATA: MRSA PCR 11/20 >> NEG Blood 11/25 >> NEG Resp 11/25 >> NOF Urine 11/25 >> NEG  ANTIMICROBIALS:  Acyclovir 11/21 >> 11/27    SUBJECTIVE:  Comfortable on RA.  Follows commands.  Poorly oriented.  Rattling breath sounds which clear with cough.  His cough is effective but requires significant encouragement  VITAL SIGNS: BP 102/65   Pulse 75   Temp 98.8 F (37.1 C) (Axillary)   Resp 20   Ht 5\' 9"  (1.753 m)   Wt 103.6 kg (228 lb 6.3 oz)   SpO2 94%   BMI 33.73 kg/m   HEMODYNAMICS:    VENTILATOR SETTINGS: FiO2 (%):  [30 %] 30 %  INTAKE / OUTPUT: I/O last 3 completed shifts: In: 839.2 [P.O.:645; I.V.:86.7; IV Piggyback:107.5] Out: 280 [Urine:280]  PHYSICAL EXAMINATION: General:RASS 0 Neuro: CNs intact, MAEs HEENT: NCAT, sclerae white Neck: no JVD Cardiovascular: reg, no M Lungs: Rattling cough, few rhonchi, no wheezes Abdomen: NABS, soft Ext: warm, no edema    LABS:  BMET Recent Labs  Lab 09/02/17 0505 09/02/17 1811 09/03/17 0403 09/04/17 1629  NA 138  --  140 136  K 3.6 3.2* 3.3* 4.1  CL 105  --  105 103  CO2 26  --  25 23  BUN 16  --  17 18  CREATININE 0.79  --  0.86 1.00  GLUCOSE 283*  --  225* 308*    Electrolytes Recent Labs  Lab 08/31/17 0500 09/02/17 0505 09/02/17 1811 09/03/17 0403 09/04/17 1629  CALCIUM  --  8.2*  --  8.1* 8.6*  MG 1.9 2.0  --  2.1  --   PHOS  --  1.5* 2.0* 3.1  --     CBC Recent Labs  Lab 09/01/17 0046 09/02/17 0505 09/03/17 0403  WBC 9.1  8.0 6.2  HGB 14.1 12.7* 12.6*  HCT 41.4 36.5* 36.2*  PLT 175 168 203    Coag's No results for input(s): APTT, INR in the last 168 hours.  Sepsis Markers Recent Labs  Lab 09/01/17 0046 09/02/17 0505 09/04/17 1629  PROCALCITON 0.16 0.14 0.21    ABG Recent Labs  Lab 08/31/17 0453 08/31/17 1219 09/02/17 0500  PHART 7.30* 7.32* 7.49*  PCO2ART 47 46 36  PO2ART 80* 75* 69*    Liver Enzymes Recent Labs  Lab 09/02/17 0505 09/03/17 0403   AST 22 40  ALT 23 38  ALKPHOS 59 57  BILITOT 0.9 0.4  ALBUMIN 2.5* 2.4*    Cardiac Enzymes No results for input(s): TROPONINI, PROBNP in the last 168 hours.  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 suicidal ideation     Cont supplemental oxygen as needed to maintain SPO2 >90% Monitor BMET intermittently Monitor I/Os Correct electrolytes as indicated  Advance diet as able per SLP recs DVT px: SQ enoxaparin Monitor CBC intermittently Transfuse per usual guidelines   Transition back to Lantus plus SSI (resistance scale) Cont valproic acid  - change to PO when able. Dosing per Neuro PT/OT following Monitor and SDU through today Will need short term SNF/Rehab after discharge   Merton Border, MD PCCM service Mobile [phone] Pager [phone] 09/05/2017 2:46 PM

## 2017-09-05 NOTE — Progress Notes (Signed)
Subjective: Patient more alert today and communicative.  No further seizures noted.  On Depakote.  Objective: Current vital signs: BP 125/84   Pulse (!) 102   Temp 98.8 F (37.1 C) (Axillary)   Resp 19   Ht 5\' 9"  (1.753 m)   Wt 103.6 kg (228 lb 6.3 oz)   SpO2 98%   BMI 33.73 kg/m  Vital signs in last 24 hours: Temp:  [98.6 F (37 C)-102.1 F (38.9 C)] 98.8 F (37.1 C) (11/29 0800) Pulse Rate:  [91-123] 102 (11/29 1000) Resp:  [9-46] 19 (11/29 1000) BP: (97-184)/(52-93) 125/84 (11/29 1000) SpO2:  [92 %-100 %] 98 % (11/29 1000) FiO2 (%):  [30 %] 30 % (11/28 1544)  Intake/Output from previous day: 11/28 0701 - 11/29 0700 In: 781.7 [P.O.:645; I.V.:86.7; IV Piggyback:50] Out: 30 [Urine:30] Intake/Output this shift: Total I/O In: 68.6 [I.V.:18.6; IV Piggyback:50] Out: -  Nutritional status: DIET - DYS 1 Room service appropriate? Yes with Assist; Fluid consistency: Nectar Thick  Neurologic Exam: Mental Status: Alert.  Speech fluent but dysarthric.  Able to follow 3 step commands without difficulty. Cranial Nerves: II: Discs flat bilaterally; Visual fields grossly normal, pupils equal, round, reactive to light and accommodation III,IV, VI: ptosis not present, extra-ocular motions intact bilaterally V,VII: smile symmetric, facial light touch sensation normal bilaterally VIII: hearing normal bilaterally IX,X: gag reflex present XI: bilateral shoulder shrug XII: midline tongue extension Motor: Moves all extremities against gravity with no focal weakness noted Sensory: Pinprick and light touch intact throughout, bilaterally    Lab Results: Basic Metabolic Panel: Recent Labs  Lab 08/30/17 0451  08/31/17 0422 08/31/17 0500 09/02/17 0505 09/02/17 1811 09/03/17 0403 09/04/17 1629  NA  --   --  139  --  138  --  140 136  K 3.6  --  3.8  --  3.6 3.2* 3.3* 4.1  CL  --   --  108  --  105  --  105 103  CO2  --   --  24  --  26  --  25 23  GLUCOSE  --   --  240*  --   283*  --  225* 308*  BUN  --   --  16  --  16  --  17 18  CREATININE  --   --  1.03  --  0.79  --  0.86 1.00  CALCIUM  --    < > 7.9*  --  8.2*  --  8.1* 8.6*  MG 2.0  --   --  1.9 2.0  --  2.1  --   PHOS  --   --   --   --  1.5* 2.0* 3.1  --    < > = values in this interval not displayed.    Liver Function Tests: Recent Labs  Lab 09/02/17 0505 09/03/17 0403  AST 22 40  ALT 23 38  ALKPHOS 59 57  BILITOT 0.9 0.4  PROT 6.0* 5.8*  ALBUMIN 2.5* 2.4*   No results for input(s): LIPASE, AMYLASE in the last 168 hours. Recent Labs  Lab 08/30/17 1118  AMMONIA 18    CBC: Recent Labs  Lab 09/01/17 0046 09/02/17 0505 09/03/17 0403  WBC 9.1 8.0 6.2  NEUTROABS  --  5.9 3.9  HGB 14.1 12.7* 12.6*  HCT 41.4 36.5* 36.2*  MCV 88.9 87.6 87.3  PLT 175 168 203    Cardiac Enzymes: No results for input(s): CKTOTAL, CKMB, CKMBINDEX, TROPONINI in the  last 168 hours.  Lipid Panel: Recent Labs  Lab 08/30/17 0119 09/02/17 0505  TRIG 277* 177*    CBG: Recent Labs  Lab 09/05/17 0504 09/05/17 0609 09/05/17 0713 09/05/17 0815 09/05/17 0904  GLUCAP 148* 198* 180* 161* 161*    Microbiology: Results for orders placed or performed during the hospital encounter of 08/26/17  MRSA PCR Screening     Status: None   Collection Time: 08/27/17  3:24 AM  Result Value Ref Range Status   MRSA by PCR NEGATIVE NEGATIVE Final    Comment:        The GeneXpert MRSA Assay (FDA approved for NASAL specimens only), is one component of a comprehensive MRSA colonization surveillance program. It is not intended to diagnose MRSA infection nor to guide or monitor treatment for MRSA infections.   Culture, respiratory (NON-Expectorated)     Status: None   Collection Time: 09/01/17 12:30 AM  Result Value Ref Range Status   Specimen Description TRACHEAL ASPIRATE  Final   Special Requests NONE  Final   Gram Stain   Final    ABUNDANT WBC PRESENT, PREDOMINANTLY PMN RARE SQUAMOUS EPITHELIAL CELLS  PRESENT ABUNDANT GRAM POSITIVE RODS FEW GRAM NEGATIVE RODS    Culture   Final    Consistent with normal respiratory flora. Performed at [facility], [place] [address] [place], [place] [address]    Report Status 09/03/2017 FINAL  Final  Urine Culture     Status: None   Collection Time: 09/01/17 12:39 AM  Result Value Ref Range Status   Specimen Description URINE, RANDOM  Final   Special Requests NONE  Final   Culture   Final    NO GROWTH Performed at [facility], [place] [address] [place], [place] [address]    Report Status 09/02/2017 FINAL  Final  Culture, blood (Routine X 2) w Reflex to ID Panel     Status: None (Preliminary result)   Collection Time: 09/01/17 12:46 AM  Result Value Ref Range Status   Specimen Description BLOOD BLOOD LEFT HAND  Final   Special Requests   Final    BOTTLES DRAWN AEROBIC AND ANAEROBIC Blood Culture adequate volume   Culture NO GROWTH 4 DAYS  Final   Report Status PENDING  Incomplete  Culture, blood (Routine X 2) w Reflex to ID Panel     Status: None (Preliminary result)   Collection Time: 09/01/17  1:00 AM  Result Value Ref Range Status   Specimen Description BLOOD RIGHT ANTECUBITAL  Final   Special Requests   Final    BOTTLES DRAWN AEROBIC AND ANAEROBIC Blood Culture adequate volume   Culture NO GROWTH 4 DAYS  Final   Report Status PENDING  Incomplete  CULTURE, BLOOD (ROUTINE X 2) w Reflex to ID Panel     Status: None (Preliminary result)   Collection Time: 09/04/17  6:15 PM  Result Value Ref Range Status   Specimen Description BLOOD RIGHT HAND  Final   Special Requests   Final    BOTTLES DRAWN AEROBIC AND ANAEROBIC Blood Culture adequate volume   Culture NO GROWTH < 24 HOURS  Final   Report Status PENDING  Incomplete  CULTURE, BLOOD (ROUTINE X 2) w Reflex to ID Panel     Status: None (Preliminary result)   Collection Time: 09/04/17  6:22 PM  Result Value Ref Range Status   Specimen Description BLOOD LEFT HAND  Final   Special  Requests   Final    BOTTLES  DRAWN AEROBIC AND ANAEROBIC Blood Culture adequate volume   Culture NO GROWTH < 24 HOURS  Final   Report Status PENDING  Incomplete    Coagulation Studies: No results for input(s): LABPROT, INR in the last 72 hours.  Imaging: Dg Chest Port 1 View  Result Date: 09/05/2017 CLINICAL DATA:  Acute respiratory failure EXAM: PORTABLE CHEST 1 VIEW COMPARISON:  09/04/2017 FINDINGS: Hypoventilation with bibasilar atelectasis unchanged. Negative for edema or effusion. Central venous catheter tip removed IMPRESSION: Hypoventilation with bibasilar atelectasis unchanged. Electronically Signed   By: Franchot Gallo M.D.   On: 09/05/2017 07:51   Dg Chest Port 1 View  Result Date: 09/04/2017 CLINICAL DATA:  Acute respiratory failure. EXAM: PORTABLE CHEST 1 VIEW COMPARISON:  08/30/2017 FINDINGS: Right internal jugular approach central venous catheter terminates in the expected location of mid superior vena cava. Cardiomediastinal silhouette is normal. Mediastinal contours appear intact. There is no evidence of pneumothorax. Bilateral lower lobe peribronchial airspace consolidation. Osseous structures are without acute abnormality. Soft tissues are grossly normal. IMPRESSION: Bilateral lower lobe peribronchial airspace consolidation may represent atelectasis or bronchitic changes. Electronically Signed   By: Fidela Salisbury M.D.   On: 09/04/2017 17:21    Medications:  I have reviewed the patient's current medications. Scheduled: . aspirin  81 mg Oral Daily  . bromocriptine  12.5 mg Oral Daily  . chlorhexidine  15 mL Mouth Rinse BID  . enoxaparin (LOVENOX) injection  40 mg Subcutaneous Q24H  . insulin aspart  0-20 Units Subcutaneous TID WC  . insulin aspart  0-5 Units Subcutaneous QHS  . insulin glargine  40 Units Subcutaneous Daily  . lisinopril  20 mg Oral Daily  . mouth rinse  15 mL Mouth Rinse q12n4p  . metoprolol tartrate  50 mg Oral BID     Assessment/Plan: Patient more alert today.  Remains on Depakote.  Level subtherapeutic at 39 but only received one increased dose administration.  Will change to po and check level in AM before further increases.    Recommendations: 1. Depakote 1000mg  BID po after this AM IV dose 2. Depakote level in AM 3. Continue seizure precautions    LOS: 9 days   Alexis Goodell, MD Neurology [phone] 09/05/2017  10:14 AM

## 2017-09-05 NOTE — Care Management Note (Addendum)
Case Management Note  Patient Details  Name: Andrew Horton MRN: [id number] Date of Birth: Feb 13, 1946  Subjective/Objective:                 Patient at present is not an inpatient rehab candidate.  Required insulin infusion 11/28 and transitioned to sliding scale Lantus. Depakote dose increased and will recheck level 11/30. Contact has been made with liaison with [facility].   Action/Plan: Reached out to [place] with [facility] to determine if she is actively following case.  Reports she is following but at present there are no available beds.   Expected Discharge Date:                  Expected Discharge Plan:     In-House Referral:     Discharge planning Services     Post Acute Care Choice:    Choice offered to:     DME Arranged:    DME Agency:     HH Arranged:    HH Agency:     Status of Service:     If discussed at [employer], dates discussed:    Additional Comments:  Katrina Stack, RN 09/05/2017, 5:29 PM

## 2017-09-05 NOTE — Care Management (Signed)
Notified by Dr. Weber Cooks yesterday that patient was put back on Bipap related to excess secretions and respiratory issues.  I have sent message to ICU provider to request thoughts on LTAC again. RNCM will follow. Andrew Horton would like to keep patient within hospital setting if possible and is considering [facility].

## 2017-09-05 NOTE — Progress Notes (Signed)
Inpatient Diabetes Program Recommendations  AACE/ADA: New Consensus Statement on Inpatient Glycemic Control (2015)  Target Ranges:  Prepandial:   less than 140 mg/dL      Peak postprandial:   less than 180 mg/dL (1-2 hours)      Critically ill patients:  140 - 180 mg/dL   Lab Results  Component Value Date   GLUCAP 161 (H) 09/05/2017   HGBA1C 8.4 (H) 08/26/2017    Review of Glycemic Control  Results for Andrew Horton, Andrew Horton (MRN [id number]) as of 09/05/2017 08:57  Ref. Range 09/05/2017 04:06 09/05/2017 05:04 09/05/2017 06:09 09/05/2017 07:13 09/05/2017 08:15  Glucose-Capillary Latest Ref Range: 65 - 99 mg/dL 155 (H) 148 (H) 198 (H) 180 (H) 161 (H)    Home DM Meds:Tradjenta 5 mg daily Metformin 1000 mg BID  Current Insulin Orders:IV insulin Phase 2 ICU Glycemic control  Initiate phase 3 when insulin rate is <4units/hour, and 6 CBG are 180mg /dl, then give Lantus 2 hours before the drip is d/c'd (dose is based on last drip rate), Novolog is based on GFR.  Do not hesitate to call with questions.   Gentry Fitz, RN, BA, MHA, CDE Diabetes Coordinator Inpatient Diabetes Program  [phone] (Team Pager) [phone] ([place]) 09/05/2017 9:03 AM

## 2017-09-05 NOTE — Progress Notes (Signed)
SLP Cancellation Note  Patient Details Name: Andrew Horton MRN: [id number] DOB: 1946/02/08   Cancelled treatment:       Reason Eval/Treat Not Completed: Fatigue/lethargy limiting ability to participate(consulted NSG; reviwewed chart notes). Spoke w/ NSG re: pt's toleration of dysphagia diet. NSG reported pt is taking bites then drinking several of the Nectar consistency drinks but is often drowsy/lethargic and requires verbal cues to attend. Discussed aspiration precautions and holding any po's if pt is drowsy/lethargic or exhibits increased s/s of aspiration during intake. NSG agreed. ST services will f/u w/ toleration of diet tomorrow at meal (AM) as pt is currently drowsy and falls asleep easily despite verbal cues. No upgrade in liquid/diet consistency would be recommended at this time d/t pt's presentation and increased risk for aspiration.     Orinda Kenner, [place], CCC-SLP Serena Petterson 09/05/2017, 5:35 PM

## 2017-09-05 NOTE — Progress Notes (Signed)
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 Intubated in ED on 11/19, extubated 11/20 AM. Persistent encephalopathy requiring re-intubation 11/21 early AM and self extubated on 11/24. 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 Seen for OT and PT evaluations on 11/27.    PT Comments    Pt initially declined but agreed with encouragement from nursing.  Participated in exercises as described below.  Pt was assisted to edge of bed with mod/max a x 2.  While he attempted to stand x 4 he was unable to effectively assist and was unable to clear hips off bed despite max a x 2.  He remained sitting at edge of bed approx 5 minutes and occasionally would lay back down on right side.  Further standing and transfers were deferred at this time due to pt and staff safety.  Nurse stated pt was able to transfer to bedside commode previously with +2 assist.  Pt may benefit from lift transfer to allow for sitting in chair if he is unable to stand next session or with nursing.   Follow Up Recommendations  CIR     Equipment Recommendations       Recommendations for Other Services Rehab consult     Precautions / Restrictions Precautions Precautions: Fall Restrictions Weight Bearing Restrictions: No    Mobility  Bed Mobility Overal bed mobility: Needs Assistance Bed Mobility: Supine to Sit;Sit to Supine Rolling: Mod assist;Max assist;+2 for physical assistance   Supine to sit: Mod assist;Max assist;+2 for physical assistance Sit to supine: Mod assist;Max assist;+2 for physical assistance   General bed mobility comments: extensive cues and encouragement  Transfers Overall transfer  level: Needs assistance Equipment used: None             General transfer comment: Attemtped to assist pt to standing x 4 with max a x 2.  Pt with poor assistance and uanble to clear bottom from bed.  Deferred due to pt and staff safety.  Ambulation/Gait             General Gait Details: NT   Stairs            Wheelchair Mobility    Modified Rankin (Stroke Patients Only)       Balance Overall balance assessment: Needs assistance Sitting-balance support: Single extremity supported Sitting balance-Leahy Scale: Poor Sitting balance - Comments: Verbal cues and encouragement to remain sitting.  Falls to right to return to sidelying at times.                                     Cognition Arousal/Alertness: Lethargic Behavior During Therapy: Flat affect Overall Cognitive Status: Within Functional Limits for tasks assessed                                        Exercises Other Exercises Other Exercises: Unsupported sitting at EOB for core therex and sitting balance x 5 minutes Other Exercises: BLE AAROM for ankle pumps, heel slides, and ab/add x 10 with rest as needed.    General  Comments        Pertinent Vitals/Pain      Home Living                      Prior Function            PT Goals (current goals can now be found in the care plan section) Progress towards PT goals: Progressing toward goals    Frequency    Min 2X/week      PT Plan Current plan remains appropriate    Co-evaluation              AM-PAC PT "6 Clicks" Daily Activity  Outcome Measure  Difficulty turning over in bed (including adjusting bedclothes, sheets and blankets)?: Unable Difficulty moving from lying on back to sitting on the side of the bed? : Unable Difficulty sitting down on and standing up from a chair with arms (e.g., wheelchair, bedside commode, etc,.)?: Unable Help needed moving to and from a bed to chair  (including a wheelchair)?: Total Help needed walking in hospital room?: Total Help needed climbing 3-5 steps with a railing? : Total 6 Click Score: 6    End of Session   Activity Tolerance: Patient limited by fatigue;Patient limited by lethargy Patient left: in bed;with call bell/phone within reach;with bed alarm set;with nursing/sitter in room Nurse Communication: Mobility status       Time: 1032-1050 PT Time Calculation (min) (ACUTE ONLY): 18 min  Charges:  $Therapeutic Activity: 8-22 mins                    G Codes:      Chesley Noon, PTA 09/05/17, 11:00 AM

## 2017-09-05 NOTE — Progress Notes (Signed)
[facility] at [place] NAME: Andrew Horton    MR#:  [id number]  DATE OF BIRTH:  [birth year]  SUBJECTIVE:  Patient now extubated.  More awake and talking now Trying to clear oral secretions  REVIEW OF SYSTEMS:   Review of Systems  Constitutional: Negative for chills, fever and weight loss.  HENT: Negative for ear discharge, ear pain and nosebleeds.   Eyes: Negative for blurred vision, pain and discharge.  Respiratory: Negative for sputum production, shortness of breath, wheezing and stridor.   Cardiovascular: Negative for chest pain, palpitations, orthopnea and PND.  Gastrointestinal: Negative for abdominal pain, diarrhea, nausea and vomiting.  Genitourinary: Negative for frequency and urgency.  Musculoskeletal: Negative for back pain and joint pain.  Neurological: Positive for weakness. Negative for sensory change, speech change and focal weakness.  Psychiatric/Behavioral: Negative for depression and hallucinations. The patient is not nervous/anxious.     DRUG ALLERGIES:   Allergies  Allergen Reactions  . Sulfa Antibiotics Hives    VITALS:  Blood pressure 102/65, pulse 75, temperature 98.8 F (37.1 C), temperature source Axillary, resp. rate 20, height 5\' 9"  (1.753 m), weight 103.6 kg (228 lb 6.3 oz), SpO2 94 %.  PHYSICAL EXAMINATION:   Physical Exam  GENERAL:  71 y.o.-year-old patient lying in the bed with no acute distress.  Critically illl EYES: Pupils equal, round, reactive to light and accommodation. No scleral icterus. Extraocular muscles intact.  HEENT: Head atraumatic, normocephalic. Oropharynx and nasopharynx clear.nECK:  Supple, no jugular venous distention. No thyroid enlargement, no tenderness.  LUNGS: Normal breath sounds bilaterally, no wheezing, rales, rhonchi. No use of accessory muscles of respiration.  CARDIOVASCULAR: S1, S2 normal. No murmurs, rubs, or gallops.  ABDOMEN: Soft, nontender, nondistended.  No  organomegaly or mass.  EXTREMITIES: No cyanosis, clubbing or edema b/l.    NEUROLOGIC: Opens eyes to verbal commands.  Moves all extremities spontaneously  pSYCHIATRIC: awake and alert SKIN: No obvious rash, lesion, or ulcer.   LABORATORY PANEL:  CBC Recent Labs  Lab 09/03/17 0403  WBC 6.2  HGB 12.6*  HCT 36.2*  PLT 203    Chemistries  Recent Labs  Lab 09/03/17 0403 09/04/17 1629  NA 140 136  K 3.3* 4.1  CL 105 103  CO2 25 23  GLUCOSE 225* 308*  BUN 17 18  CREATININE 0.86 1.00  CALCIUM 8.1* 8.6*  MG 2.1  --   AST 40  --   ALT 38  --   ALKPHOS 57  --   BILITOT 0.4  --    Cardiac Enzymes No results for input(s): TROPONINI in the last 168 hours. RADIOLOGY:  Dg Chest Port 1 View  Result Date: 09/05/2017 CLINICAL DATA:  Acute respiratory failure EXAM: PORTABLE CHEST 1 VIEW COMPARISON:  09/04/2017 FINDINGS: Hypoventilation with bibasilar atelectasis unchanged. Negative for edema or effusion. Central venous catheter tip removed IMPRESSION: Hypoventilation with bibasilar atelectasis unchanged. Electronically Signed   By: Franchot Gallo M.D.   On: 09/05/2017 07:51   Dg Chest Port 1 View  Result Date: 09/04/2017 CLINICAL DATA:  Acute respiratory failure. EXAM: PORTABLE CHEST 1 VIEW COMPARISON:  08/30/2017 FINDINGS: Right internal jugular approach central venous catheter terminates in the expected location of mid superior vena cava. Cardiomediastinal silhouette is normal. Mediastinal contours appear intact. There is no evidence of pneumothorax. Bilateral lower lobe peribronchial airspace consolidation. Osseous structures are without acute abnormality. Soft tissues are grossly normal. IMPRESSION: Bilateral lower lobe peribronchial airspace consolidation may represent atelectasis or  bronchitic changes. Electronically Signed   By: Fidela Salisbury M.D.   On: 09/04/2017 17:21   ASSESSMENT AND PLAN:  71 year old male admitted for status epilepticus. 1.Acute Encephalopathy - Pt  came in withStatus epilepticus: New onset seizures.  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 This is nonspecific but meningioma is favored - EEG with general background slowing but no evidence of electrographic seizure activity - neurology consult appreciated. - continue IV Keppra--- changed to po Depakote (neurology managing the dose)  2. Acute respiratory failure: With hypoxemia; intubated for airway protection.  --Patient now extubated  3. Acute kidney injury: Hydrated with intravenous fluid. Avoid nephrotoxic agents.   -resolved  4. Hypertension: Continue current medications  5. Diabetes mellitus type 2:--NEW -Sliding scale insulin -Lantus and insulin aspart tid  6. Hyperlipidemia: Continue statin therapy  7. DVT prophylaxis: Heparin  8.?suicidal thoughts Psych consult appreciated. No further recommendations given . Sitter d/ced by Dr Weber Cooks  PT recommends CIR CSW for d/c planning  Case discussed with Care Management/Social Worker. Management plans discussed with the patient, family and they are in agreement.  CODE STATUS: full  DVT Prophylaxis: Heparin  TOTAL TIME TAKING CARE OF THIS PATIENT: *25* minutes.  >50% time spent on counselling and coordination of care .  Note: This dictation was prepared with Dragon dictation along with smaller phrase technology. Any transcriptional errors that result from this process are unintentional.  Fritzi Mandes M.D on 09/05/2017   Between 7am to 6pm - Pager - [phone]  After 6pm go to [email or web address] - password [email or web address] [facility]  Office  [phone]  CC: Primary care physician; System, Pcp Not In

## 2017-09-05 NOTE — Progress Notes (Signed)
Attempted to feed patient pureed peaches per his request but patient kept falling asleep with peaches in his mouth requiring verbal stimulation to wake up and encouragement to finish swallowing bite.  RN made Harless Nakayama, NP aware of all mentioned above.

## 2017-09-05 NOTE — Consult Note (Signed)
[facility] Face-to-Face Psychiatry Consult   Reason for Consult: Consult for 71 year old man with a past history of mild depression.  Following up on previous concern about suicidality. Referring Physician:  Posey Pronto Patient Identification: Andrew Horton MRN:  [id number] Principal Diagnosis: Adjustment disorder with mixed anxiety and depressed mood Diagnosis:   Patient Active Problem List   Diagnosis Date Noted  . Adjustment disorder with mixed anxiety and depressed mood [F43.23] 09/04/2017  . Dysthymia [F34.1] 09/04/2017  . Acute respiratory failure ([place]) [J96.00]   . Status epilepticus ([place]) [G40.901] 08/27/2017    Total Time spent with patient: 30 minutes  Subjective:   Andrew Horton is a 71 y.o. male patient admitted with patient not really able to give much verbal information.  HPI: See note from yesterday.  71 year old man currently in the intensive care unit recovering after an episode of status epilepticus and now complicated by respiratory problems.  Today the patient is no longer on the BiPAP mask and so was able to interact a little bit more.  Still, his speech is very minimal.  Could only speak a few words before getting exhausted and even those were difficult to understand.  Patient did know that he was in the hospital.  Not clear if he has a very complete understanding of what is going on.  He says his mood is feeling so-so and kind of bad.  He denies however having any thoughts of hurting himself or wishes to be dead.  Denied having any hallucinations.  Social history: Living by himself.  Minimal social support.  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  Obesity.  Diabetes.  Substance abuse history: Unknown  Past Psychiatric History: Patient had no previous hospitalizations no history of suicide attempts no history of medication.  Had been seen for therapy in the past  Risk to Self: Is patient at risk for suicide?: No Risk to Others:   Prior  Inpatient Therapy:   Prior Outpatient Therapy:    Past Medical History:  Past Medical History:  Diagnosis Date  . Brain tumor (benign) ([place])   The histories are not reviewed yet. Please review them in the "History" navigator section and refresh this [place]. Family History: History reviewed. No pertinent family history. Family Psychiatric  History: Unknown Social History:  Social History   Substance and Sexual Activity  Alcohol Use No  . Frequency: Never     Social History   Substance and Sexual Activity  Drug Use No    Social History   Socioeconomic History  . Marital status: Single    Spouse name: None  . Number of children: None  . Years of education: None  . Highest education level: None  Social Needs  . Financial resource strain: None  . Food insecurity - worry: None  . Food insecurity - inability: None  . Transportation needs - medical: None  . Transportation needs - non-medical: None  Occupational History  . None  Tobacco Use  . Smoking status: Never Smoker  . Smokeless tobacco: Never Used  Substance and Sexual Activity  . Alcohol use: No    Frequency: Never  . Drug use: No  . Sexual activity: None  Other Topics Concern  . None  Social History Narrative  . None   Additional Social History:    Allergies:   Allergies  Allergen Reactions  . Sulfa Antibiotics Hives    Labs:  Results for orders placed or performed during the hospital encounter of 08/26/17 (from the  past 48 hour(s))  Glucose, capillary     Status: Abnormal   Collection Time: 09/03/17 10:44 PM  Result Value Ref Range   Glucose-Capillary 194 (H) 65 - 99 mg/dL   Comment 1 Notify RN   Glucose, capillary     Status: Abnormal   Collection Time: 09/04/17  7:33 AM  Result Value Ref Range   Glucose-Capillary 258 (H) 65 - 99 mg/dL  Glucose, capillary     Status: Abnormal   Collection Time: 09/04/17  1:06 PM  Result Value Ref Range   Glucose-Capillary 309 (H) 65 - 99 mg/dL  Valproic  acid level     Status: Abnormal   Collection Time: 09/04/17  3:12 PM  Result Value Ref Range   Valproic Acid Lvl 36 (L) 50.0 - 100.0 ug/mL  Glucose, capillary     Status: Abnormal   Collection Time: 09/04/17  4:18 PM  Result Value Ref Range   Glucose-Capillary 306 (H) 65 - 99 mg/dL  Basic metabolic panel     Status: Abnormal   Collection Time: 09/04/17  4:29 PM  Result Value Ref Range   Sodium 136 135 - 145 mmol/L   Potassium 4.1 3.5 - 5.1 mmol/L   Chloride 103 101 - 111 mmol/L   CO2 23 22 - 32 mmol/L   Glucose, Bld 308 (H) 65 - 99 mg/dL   BUN 18 6 - 20 mg/dL   Creatinine, Ser 1.00 0.61 - 1.24 mg/dL   Calcium 8.6 (L) 8.9 - 10.3 mg/dL   GFR calc non Af Amer >60 >60 mL/min   GFR calc Af Amer >60 >60 mL/min    Comment: (NOTE) The eGFR has been calculated using the CKD EPI equation. This calculation has not been validated in all clinical situations. eGFR's persistently <60 mL/min signify possible Chronic Kidney Disease.    Anion gap 10 5 - 15  Procalcitonin - Baseline     Status: None   Collection Time: 09/04/17  4:29 PM  Result Value Ref Range   Procalcitonin 0.21 ng/mL    Comment:        Interpretation: PCT (Procalcitonin) <= 0.5 ng/mL: Systemic infection (sepsis) is not likely. Local bacterial infection is possible. (NOTE)       Sepsis PCT Algorithm           Lower Respiratory Tract                                      Infection PCT Algorithm    ----------------------------     ----------------------------         PCT < 0.25 ng/mL                PCT < 0.10 ng/mL         Strongly encourage             Strongly discourage   discontinuation of antibiotics    initiation of antibiotics    ----------------------------     -----------------------------       PCT 0.25 - 0.50 ng/mL            PCT 0.10 - 0.25 ng/mL               OR       >80% decrease in PCT            Discourage initiation of  antibiotics      Encourage  discontinuation           of antibiotics    ----------------------------     -----------------------------         PCT >= 0.50 ng/mL              PCT 0.26 - 0.50 ng/mL               AND        <80% decrease in PCT             Encourage initiation of                                             antibiotics       Encourage continuation           of antibiotics    ----------------------------     -----------------------------        PCT >= 0.50 ng/mL                  PCT > 0.50 ng/mL               AND         increase in PCT                  Strongly encourage                                      initiation of antibiotics    Strongly encourage escalation           of antibiotics                                     -----------------------------                                           PCT <= 0.25 ng/mL                                                 OR                                        > 80% decrease in PCT                                     Discontinue / Do not initiate                                             antibiotics   Glucose, capillary     Status: Abnormal   Collection Time: 09/04/17  5:44 PM  Result Value Ref Range   Glucose-Capillary 319 (H)  65 - 99 mg/dL  CULTURE, BLOOD (ROUTINE X 2) w Reflex to ID Panel     Status: None (Preliminary result)   Collection Time: 09/04/17  6:15 PM  Result Value Ref Range   Specimen Description BLOOD RIGHT HAND    Special Requests      BOTTLES DRAWN AEROBIC AND ANAEROBIC Blood Culture adequate volume   Culture NO GROWTH < 24 HOURS    Report Status PENDING   CULTURE, BLOOD (ROUTINE X 2) w Reflex to ID Panel     Status: None (Preliminary result)   Collection Time: 09/04/17  6:22 PM  Result Value Ref Range   Specimen Description BLOOD LEFT HAND    Special Requests      BOTTLES DRAWN AEROBIC AND ANAEROBIC Blood Culture adequate volume   Culture NO GROWTH < 24 HOURS    Report Status PENDING   Glucose, capillary     Status: Abnormal    Collection Time: 09/04/17  6:49 PM  Result Value Ref Range   Glucose-Capillary 307 (H) 65 - 99 mg/dL   Comment 1 Notify RN   Glucose, capillary     Status: Abnormal   Collection Time: 09/04/17  7:47 PM  Result Value Ref Range   Glucose-Capillary 287 (H) 65 - 99 mg/dL  Glucose, capillary     Status: Abnormal   Collection Time: 09/04/17  8:55 PM  Result Value Ref Range   Glucose-Capillary 277 (H) 65 - 99 mg/dL  Glucose, capillary     Status: Abnormal   Collection Time: 09/04/17  9:58 PM  Result Value Ref Range   Glucose-Capillary 208 (H) 65 - 99 mg/dL  Glucose, capillary     Status: Abnormal   Collection Time: 09/04/17 11:02 PM  Result Value Ref Range   Glucose-Capillary 202 (H) 65 - 99 mg/dL   Comment 1 Notify RN   Glucose, capillary     Status: Abnormal   Collection Time: 09/05/17 12:07 AM  Result Value Ref Range   Glucose-Capillary 151 (H) 65 - 99 mg/dL  Urinalysis, Complete w Microscopic     Status: Abnormal   Collection Time: 09/05/17 12:43 AM  Result Value Ref Range   Color, Urine YELLOW (A) YELLOW   APPearance CLOUDY (A) CLEAR   Specific Gravity, Urine 1.018 1.005 - 1.030   pH 5.0 5.0 - 8.0   Glucose, UA 50 (A) NEGATIVE mg/dL   Hgb urine dipstick SMALL (A) NEGATIVE   Bilirubin Urine NEGATIVE NEGATIVE   Ketones, ur 5 (A) NEGATIVE mg/dL   Protein, ur 30 (A) NEGATIVE mg/dL   Nitrite NEGATIVE NEGATIVE   Leukocytes, UA TRACE (A) NEGATIVE   RBC / HPF 0-5 0 - 5 RBC/hpf   WBC, UA TOO NUMEROUS TO COUNT 0 - 5 WBC/hpf   Bacteria, UA FEW (A) NONE SEEN   Squamous Epithelial / LPF 6-30 (A) NONE SEEN   Mucus PRESENT    Hyaline Casts, UA PRESENT   Glucose, capillary     Status: Abnormal   Collection Time: 09/05/17  1:02 AM  Result Value Ref Range   Glucose-Capillary 126 (H) 65 - 99 mg/dL  Glucose, capillary     Status: Abnormal   Collection Time: 09/05/17  2:05 AM  Result Value Ref Range   Glucose-Capillary 122 (H) 65 - 99 mg/dL   Comment 1 Notify RN    Comment 2  Document in Chart   Glucose, capillary     Status: Abnormal   Collection Time: 09/05/17  3:08 AM  Result  Value Ref Range   Glucose-Capillary 138 (H) 65 - 99 mg/dL  Glucose, capillary     Status: Abnormal   Collection Time: 09/05/17  4:06 AM  Result Value Ref Range   Glucose-Capillary 155 (H) 65 - 99 mg/dL   Comment 1 Notify RN    Comment 2 Document in Chart   Glucose, capillary     Status: Abnormal   Collection Time: 09/05/17  5:04 AM  Result Value Ref Range   Glucose-Capillary 148 (H) 65 - 99 mg/dL  Valproic acid level     Status: Abnormal   Collection Time: 09/05/17  5:46 AM  Result Value Ref Range   Valproic Acid Lvl 39 (L) 50.0 - 100.0 ug/mL  Glucose, capillary     Status: Abnormal   Collection Time: 09/05/17  6:09 AM  Result Value Ref Range   Glucose-Capillary 198 (H) 65 - 99 mg/dL  Glucose, capillary     Status: Abnormal   Collection Time: 09/05/17  7:13 AM  Result Value Ref Range   Glucose-Capillary 180 (H) 65 - 99 mg/dL   Comment 1 Notify RN    Comment 2 Document in Chart   Glucose, capillary     Status: Abnormal   Collection Time: 09/05/17  8:15 AM  Result Value Ref Range   Glucose-Capillary 161 (H) 65 - 99 mg/dL  Glucose, capillary     Status: Abnormal   Collection Time: 09/05/17  9:04 AM  Result Value Ref Range   Glucose-Capillary 161 (H) 65 - 99 mg/dL  Glucose, capillary     Status: Abnormal   Collection Time: 09/05/17 10:03 AM  Result Value Ref Range   Glucose-Capillary 194 (H) 65 - 99 mg/dL  Glucose, capillary     Status: Abnormal   Collection Time: 09/05/17 11:33 AM  Result Value Ref Range   Glucose-Capillary 194 (H) 65 - 99 mg/dL    Current Facility-Administered Medications  Medication Dose Route Frequency Provider Last Rate Last Dose  . acetaminophen (TYLENOL) tablet 650 mg  650 mg Oral Q6H PRN Harrie Foreman, MD       Or  . acetaminophen (TYLENOL) suppository 650 mg  650 mg Rectal Q6H PRN Harrie Foreman, MD   650 mg at 09/04/17 1736  .  aspirin chewable tablet 81 mg  81 mg Oral Daily Wilhelmina Mcardle, MD   81 mg at 09/05/17 3762  . bromocriptine (PARLODEL) tablet 12.5 mg  12.5 mg Oral Daily Wilhelmina Mcardle, MD   12.5 mg at 09/05/17 0940  . ceftAZIDime (FORTAZ) 2 gram/50 mL D5W IVPB - DUPLEX  2 g Intravenous Q8H Fritzi Mandes, MD      . chlorhexidine (PERIDEX) 0.12 % solution 15 mL  15 mL Mouth Rinse BID Fritzi Mandes, MD   15 mL at 09/05/17 0945  . divalproex (DEPAKOTE) DR tablet 1,000 mg  1,000 mg Oral Q12H Alexis Goodell, MD      . enoxaparin (LOVENOX) injection 40 mg  40 mg Subcutaneous Q24H Fritzi Mandes, MD   40 mg at 09/05/17 0943  . hydrALAZINE (APRESOLINE) injection 10 mg  10 mg Intravenous Q2H PRN Flora Lipps, MD   10 mg at 09/04/17 0055  . insulin aspart (novoLOG) injection 0-20 Units  0-20 Units Subcutaneous TID WC Wilhelmina Mcardle, MD   4 Units at 09/05/17 1138  . insulin aspart (novoLOG) injection 0-5 Units  0-5 Units Subcutaneous QHS Wilhelmina Mcardle, MD      . insulin glargine (LANTUS) injection 40 Units  40 Units Subcutaneous Daily Wilhelmina Mcardle, MD   40 Units at 09/05/17 0935  . lisinopril (PRINIVIL,ZESTRIL) tablet 20 mg  20 mg Oral Daily Wilhelmina Mcardle, MD   20 mg at 09/05/17 5621  . LORazepam (ATIVAN) injection 2 mg  2 mg Intravenous PRN Wilhelmina Mcardle, MD   2 mg at 09/04/17 0228  . MEDLINE mouth rinse  15 mL Mouth Rinse q12n4p Fritzi Mandes, MD   15 mL at 09/03/17 1620  . metoprolol tartrate (LOPRESSOR) tablet 50 mg  50 mg Oral BID Wilhelmina Mcardle, MD   50 mg at 09/05/17 3086  . ondansetron (ZOFRAN) injection 4 mg  4 mg Intravenous Q6H PRN Harrie Foreman, MD        Musculoskeletal: Strength & Muscle Tone: decreased Gait & Station: unable to stand Patient leans: N/A  Psychiatric Specialty Exam: Physical Exam  Nursing note and vitals reviewed. Constitutional: He appears well-developed and well-nourished.  HENT:  Head: Normocephalic and atraumatic.  Eyes: Conjunctivae are normal. Pupils are  equal, round, and reactive to light.  Neck: Normal range of motion.  Cardiovascular: Normal heart sounds.  Respiratory: He is in respiratory distress. He has rales.  GI: Soft.  Musculoskeletal: Normal range of motion.  Neurological: He is alert.  Skin: Skin is warm and dry.  Psychiatric: His affect is blunt. His speech is delayed. He is slowed. He exhibits a depressed mood. He expresses no suicidal ideation. He exhibits abnormal recent memory.    Review of Systems  Constitutional: Positive for malaise/fatigue.  HENT: Negative.   Eyes: Negative.   Respiratory: Negative.   Cardiovascular: Negative.   Gastrointestinal: Negative.   Musculoskeletal: Negative.   Skin: Negative.   Neurological: Positive for weakness.  Psychiatric/Behavioral: Positive for depression and memory loss. Negative for hallucinations, substance abuse and suicidal ideas. The patient is not nervous/anxious and does not have insomnia.     Blood pressure 102/65, pulse 75, temperature 98.8 F (37.1 C), temperature source Axillary, resp. rate 20, height '5\' 9"'$  (1.753 m), weight 103.6 kg (228 lb 6.3 oz), SpO2 94 %.Body mass index is 33.73 kg/m.  General Appearance: Disheveled  Eye Contact:  Fair  Speech:  Garbled  Volume:  Decreased  Mood:  Dysphoric  Affect:  Constricted  Thought Process:  NA  Orientation:  Full (Time, Place, and Person)  Thought Content:  Negative  Suicidal Thoughts:  No  Homicidal Thoughts:  No  Memory:  Immediate;   Fair Recent;   Poor Remote;   Fair  Judgement:  Fair  Insight:  Fair  Psychomotor Activity:  Decreased  Concentration:  Concentration: Fair  Recall:  [employer] of Knowledge:  Fair  Language:  Fair  Akathisia:  No  Handed:  Right  AIMS (if indicated):     Assets:  Desire for Improvement  ADL's:  Impaired  Cognition:  Impaired,  Moderate  Sleep:        Treatment Plan Summary: Plan 71 year old man is now better able to give some information although still quite  impaired.  Denies suicidal intent or plan or wish.  Has not acted out in a dangerous or suicidal manner.  Low mood but difficult to get a full assessment given his limited ability to communicate.  Explained to patient the concerns.  Supportive counseling and encouragement.  Discussed with him briefly the idea of medication.  He did not express an opinion one way or the other.  I think it would be better to still wait  while he is still a little medically unstable before making any decision about medication.  I will continue to follow up regularly.  No need to reinstate sitter or suicide precautions  Disposition: Patient does not meet criteria for psychiatric inpatient admission. Supportive therapy provided about ongoing stressors.  Alethia Berthold, MD 09/05/2017 4:22 PM

## 2017-09-06 ENCOUNTER — Inpatient Hospital Stay: Payer: Medicare Other

## 2017-09-06 LAB — CBC
HEMATOCRIT: 43.5 % (ref 40.0–52.0)
HEMOGLOBIN: 14.5 g/dL (ref 13.0–18.0)
MCH: 29.6 pg (ref 26.0–34.0)
MCHC: 33.4 g/dL (ref 32.0–36.0)
MCV: 88.6 fL (ref 80.0–100.0)
Platelets: 306 10*3/uL (ref 150–440)
RBC: 4.92 MIL/uL (ref 4.40–5.90)
RDW: 13.8 % (ref 11.5–14.5)
WBC: 15.5 10*3/uL — ABNORMAL HIGH (ref 3.8–10.6)

## 2017-09-06 LAB — GLUCOSE, CAPILLARY
Glucose-Capillary: 267 mg/dL — ABNORMAL HIGH (ref 65–99)
Glucose-Capillary: 211 mg/dL — ABNORMAL HIGH (ref 65–99)
Glucose-Capillary: 279 mg/dL — ABNORMAL HIGH (ref 65–99)
Glucose-Capillary: 248 mg/dL — ABNORMAL HIGH (ref 65–99)
Glucose-Capillary: 226 mg/dL — ABNORMAL HIGH (ref 65–99)
Glucose-Capillary: 215 mg/dL — ABNORMAL HIGH (ref 65–99)
Glucose-Capillary: 161 mg/dL — ABNORMAL HIGH (ref 65–99)
Glucose-Capillary: 142 mg/dL — ABNORMAL HIGH (ref 65–99)
Glucose-Capillary: 121 mg/dL — ABNORMAL HIGH (ref 65–99)
Glucose-Capillary: 125 mg/dL — ABNORMAL HIGH (ref 65–99)
Glucose-Capillary: 130 mg/dL — ABNORMAL HIGH (ref 65–99)
Glucose-Capillary: 184 mg/dL — ABNORMAL HIGH (ref 65–99)

## 2017-09-06 LAB — VALPROIC ACID LEVEL: Valproic Acid Lvl: 43 ug/mL — ABNORMAL LOW (ref 50.0–100.0)

## 2017-09-06 LAB — BASIC METABOLIC PANEL
Anion gap: 9 (ref 5–15)
BUN: 23 mg/dL — ABNORMAL HIGH (ref 6–20)
CHLORIDE: 105 mmol/L (ref 101–111)
CO2: 23 mmol/L (ref 22–32)
CREATININE: 1.07 mg/dL (ref 0.61–1.24)
Calcium: 8.4 mg/dL — ABNORMAL LOW (ref 8.9–10.3)
GFR calc non Af Amer: >60 mL/min (ref >60)
Glucose, Bld: 312 mg/dL — ABNORMAL HIGH (ref 65–99)
POTASSIUM: 4.1 mmol/L (ref 3.5–5.1)
Sodium: 137 mmol/L (ref 135–145)

## 2017-09-06 LAB — CULTURE, BLOOD (ROUTINE X 2)
CULTURE: NO GROWTH
Culture: NO GROWTH
SPECIAL REQUESTS: ADEQUATE
SPECIAL REQUESTS: ADEQUATE

## 2017-09-06 LAB — MAGNESIUM: Magnesium: 2.3 mg/dL (ref 1.7–2.4)

## 2017-09-06 LAB — PHOSPHORUS: Phosphorus: 2.6 mg/dL (ref 2.5–4.6)

## 2017-09-06 MED ORDER — PANTOPRAZOLE SODIUM 40 MG IV SOLR
40.0000 mg | INTRAVENOUS | Status: DC
  Administered 2017-09-06 – 2017-09-09 (×4): 40 mg via INTRAVENOUS
  Filled 2017-09-06 (×4): qty 40

## 2017-09-06 MED ORDER — LISINOPRIL 20 MG PO TABS: 20.0000 mg | ORAL_TABLET | Freq: Every day | ORAL | Status: DC

## 2017-09-06 MED ORDER — VANCOMYCIN HCL 10 G IV SOLR
1250.0000 mg | Freq: Two times a day (BID) | INTRAVENOUS | Status: DC
  Administered 2017-09-07: 1250 mg via INTRAVENOUS
  Filled 2017-09-06 (×2): qty 1250

## 2017-09-06 MED ORDER — VITAL HIGH PROTEIN PO LIQD: 1000.0000 mL | ORAL | Status: DC

## 2017-09-06 MED ORDER — VITAL 1.5 CAL PO LIQD: 1000.0000 mL | ORAL | Status: DC

## 2017-09-06 MED ORDER — VANCOMYCIN HCL 10 G IV SOLR
1250.0000 mg | INTRAVENOUS | Status: DC
  Filled 2017-09-06: qty 1250

## 2017-09-06 MED ORDER — ATORVASTATIN CALCIUM 20 MG PO TABS
80.0000 mg | ORAL_TABLET | Freq: Every day | ORAL | Status: DC
  Administered 2017-09-06: 80 mg
  Filled 2017-09-06: qty 4

## 2017-09-06 MED ORDER — VALPROATE SODIUM 500 MG/5ML IV SOLN
1500.0000 mg | Freq: Two times a day (BID) | INTRAVENOUS | Status: DC
  Administered 2017-09-07 – 2017-09-09 (×5): 1500 mg via INTRAVENOUS
  Filled 2017-09-06 (×7): qty 15

## 2017-09-06 MED ORDER — STERILE WATER FOR INJECTION IJ SOLN
INTRAMUSCULAR | Status: AC
Start: 2017-09-06 — End: 2017-09-06
  Administered 2017-09-06: 09:00:00
  Filled 2017-09-06: qty 10

## 2017-09-06 MED ORDER — PRO-STAT SUGAR FREE PO LIQD
30.0000 mL | Freq: Every day | ORAL | Status: DC
  Administered 2017-09-06: 30 mL

## 2017-09-06 MED ORDER — MIRTAZAPINE 15 MG PO TBDP
15.0000 mg | ORAL_TABLET | Freq: Every day | ORAL | Status: DC
  Administered 2017-09-06 – 2017-09-08 (×2): 15 mg via NASOGASTRIC
  Filled 2017-09-06 (×4): qty 1

## 2017-09-06 MED ORDER — VANCOMYCIN HCL 10 G IV SOLR
1250.0000 mg | INTRAVENOUS | Status: AC
  Administered 2017-09-06: 1250 mg via INTRAVENOUS
  Filled 2017-09-06: qty 1250

## 2017-09-06 MED ORDER — FUROSEMIDE 10 MG/ML IJ SOLN
40.0000 mg | Freq: Once | INTRAMUSCULAR | Status: AC
  Administered 2017-09-06: 40 mg via INTRAVENOUS
  Filled 2017-09-06: qty 4

## 2017-09-06 MED ORDER — VITAL 1.5 CAL PO LIQD
1000.0000 mL | ORAL | Status: DC
  Administered 2017-09-06: 1000 mL

## 2017-09-06 MED ORDER — BROMOCRIPTINE MESYLATE 2.5 MG PO TABS
12.5000 mg | ORAL_TABLET | Freq: Every day | ORAL | Status: DC
  Filled 2017-09-06 (×3): qty 5

## 2017-09-06 MED ORDER — SODIUM CHLORIDE 0.9 % IV SOLN
INTRAVENOUS | Status: DC
  Administered 2017-09-06: 2.2 [IU]/h via INTRAVENOUS
  Filled 2017-09-06 (×2): qty 1

## 2017-09-06 MED ORDER — ASPIRIN 81 MG PO CHEW: 81.0000 mg | CHEWABLE_TABLET | Freq: Every day | ORAL | Status: DC

## 2017-09-06 NOTE — Consult Note (Signed)
[facility] Consult   Reason for Consult: Follow-up consult 71 year old man with recent seizures and respiratory problems in the intensive care unit. Referring Physician:  Tressia Miners Patient Identification: Andrew Horton MRN:  [id number] Principal Diagnosis: Adjustment disorder with mixed anxiety and depressed mood Diagnosis:   Patient Active Problem List   Diagnosis Date Noted  . Adjustment disorder with mixed anxiety and depressed mood [F43.23] 09/04/2017  . Dysthymia [F34.1] 09/04/2017  . Acute respiratory failure ([place]) [J96.00]   . Status epilepticus ([place] ) [G40.901] 08/27/2017    Total Time spent with patient: 30 minutes  Subjective:   Andrew Horton is a 71 y.o. male patient admitted with patient not really able to communicate.  HPI: See previous notes.  71 year old man with a history of dysthymia and anxiety who presented to the hospital with status epilepticus and has slowly stabilized in the intensive care unit.  Seen today the patient was awake and appeared alert.  He now has a feeding tube in place because of continued aspiration problems.  Patient made some attempts to communicate but his speech is very difficult to understand.  He seemed to indicate that he was feeling somewhat hopeless but not intending to try to harm himself.  Unclear how much he understands about his condition.  Still seems rather passive.  Indicated that he really did not care very much about what was going on with him.  Social history: Apparently there is no family involvement little social support.  Past Psychiatric History: Past documentation of complaints of depression but no previous hospitalization or suicide attempts or medication  Risk to Self: Is patient at risk for suicide?: No Risk to Others:   Prior Inpatient Therapy:   Prior Outpatient Therapy:    Past Medical History:  Past Medical History:  Diagnosis Date  . Brain tumor (benign) ([place])     The histories are not  reviewed yet. Please review them in the "History" navigator section and refresh this [place]. Family History: History reviewed. No pertinent family history. Family Psychiatric  History: Unknown Social History:  Social History   Substance and Sexual Activity  Alcohol Use No  . Frequency: Never     Social History   Substance and Sexual Activity  Drug Use No    Social History   Socioeconomic History  . Marital status: Single    Spouse name: None  . Number of children: None  . Years of education: None  . Highest education level: None  Social Needs  . Financial resource strain: None  . Food insecurity - worry: None  . Food insecurity - inability: None  . Transportation needs - medical: None  . Transportation needs - non-medical: None  Occupational History  . None  Tobacco Use  . Smoking status: Never Smoker  . Smokeless tobacco: Never Used  Substance and Sexual Activity  . Alcohol use: No    Frequency: Never  . Drug use: No  . Sexual activity: None  Other Topics Concern  . None  Social History Narrative  . None   Additional Social History:    Allergies:   Allergies  Allergen Reactions  . Sulfa Antibiotics Hives    Labs:  Results for orders placed or performed during the hospital encounter of 08/26/17 (from the past 48 hour(s))  CULTURE, BLOOD (ROUTINE X 2) w Reflex to ID Panel     Status: None (Preliminary result)   Collection Time: 09/04/17  6:15 PM  Result Value Ref Range   Specimen Description  BLOOD RIGHT HAND    Special Requests      BOTTLES DRAWN AEROBIC AND ANAEROBIC Blood Culture adequate volume   Culture NO GROWTH 2 DAYS    Report Status PENDING   CULTURE, BLOOD (ROUTINE X 2) w Reflex to ID Panel     Status: None (Preliminary result)   Collection Time: 09/04/17  6:22 PM  Result Value Ref Range   Specimen Description BLOOD LEFT HAND    Special Requests      BOTTLES DRAWN AEROBIC AND ANAEROBIC Blood Culture adequate volume   Culture NO GROWTH 2  DAYS    Report Status PENDING   Glucose, capillary     Status: Abnormal   Collection Time: 09/04/17  6:49 PM  Result Value Ref Range   Glucose-Capillary 307 (H) 65 - 99 mg/dL   Comment 1 Notify RN   Glucose, capillary     Status: Abnormal   Collection Time: 09/04/17  7:47 PM  Result Value Ref Range   Glucose-Capillary 287 (H) 65 - 99 mg/dL  Glucose, capillary     Status: Abnormal   Collection Time: 09/04/17  8:55 PM  Result Value Ref Range   Glucose-Capillary 277 (H) 65 - 99 mg/dL  Glucose, capillary     Status: Abnormal   Collection Time: 09/04/17  9:58 PM  Result Value Ref Range   Glucose-Capillary 208 (H) 65 - 99 mg/dL  Glucose, capillary     Status: Abnormal   Collection Time: 09/04/17 11:02 PM  Result Value Ref Range   Glucose-Capillary 202 (H) 65 - 99 mg/dL   Comment 1 Notify RN   Glucose, capillary     Status: Abnormal   Collection Time: 09/05/17 12:07 AM  Result Value Ref Range   Glucose-Capillary 151 (H) 65 - 99 mg/dL  Urinalysis, Complete w Microscopic     Status: Abnormal   Collection Time: 09/05/17 12:43 AM  Result Value Ref Range   Color, Urine YELLOW (A) YELLOW   APPearance CLOUDY (A) CLEAR   Specific Gravity, Urine 1.018 1.005 - 1.030   pH 5.0 5.0 - 8.0   Glucose, UA 50 (A) NEGATIVE mg/dL   Hgb urine dipstick SMALL (A) NEGATIVE   Bilirubin Urine NEGATIVE NEGATIVE   Ketones, ur 5 (A) NEGATIVE mg/dL   Protein, ur 30 (A) NEGATIVE mg/dL   Nitrite NEGATIVE NEGATIVE   Leukocytes, UA TRACE (A) NEGATIVE   RBC / HPF 0-5 0 - 5 RBC/hpf   WBC, UA TOO NUMEROUS TO COUNT 0 - 5 WBC/hpf   Bacteria, UA FEW (A) NONE SEEN   Squamous Epithelial / LPF 6-30 (A) NONE SEEN   Mucus PRESENT    Hyaline Casts, UA PRESENT   Urine Culture     Status: Abnormal (Preliminary result)   Collection Time: 09/05/17 12:43 AM  Result Value Ref Range   Specimen Description URINE, RANDOM    Special Requests NONE    Culture (A)     >=100,000 COLONIES/mL ENTEROCOCCUS  FAECALIS SUSCEPTIBILITIES TO FOLLOW Performed at [facility], [address] [place],  [address]    Report Status PENDING   Glucose, capillary     Status: Abnormal   Collection Time: 09/05/17  1:02 AM  Result Value Ref Range   Glucose-Capillary 126 (H) 65 - 99 mg/dL  Glucose, capillary     Status: Abnormal   Collection Time: 09/05/17  2:05 AM  Result Value Ref Range   Glucose-Capillary 122 (H) 65 - 99 mg/dL   Comment 1 Notify  RN    Comment 2 Document in Chart   Glucose, capillary     Status: Abnormal   Collection Time: 09/05/17  3:08 AM  Result Value Ref Range   Glucose-Capillary 138 (H) 65 - 99 mg/dL  Glucose, capillary     Status: Abnormal   Collection Time: 09/05/17  4:06 AM  Result Value Ref Range   Glucose-Capillary 155 (H) 65 - 99 mg/dL   Comment 1 Notify RN    Comment 2 Document in Chart   Glucose, capillary     Status: Abnormal   Collection Time: 09/05/17  5:04 AM  Result Value Ref Range   Glucose-Capillary 148 (H) 65 - 99 mg/dL  Valproic acid level     Status: Abnormal   Collection Time: 09/05/17  5:46 AM  Result Value Ref Range   Valproic Acid Lvl 39 (L) 50.0 - 100.0 ug/mL  Glucose, capillary     Status: Abnormal   Collection Time: 09/05/17  6:09 AM  Result Value Ref Range   Glucose-Capillary 198 (H) 65 - 99 mg/dL  Glucose, capillary     Status: Abnormal   Collection Time: 09/05/17  7:13 AM  Result Value Ref Range   Glucose-Capillary 180 (H) 65 - 99 mg/dL   Comment 1 Notify RN    Comment 2 Document in Chart   Glucose, capillary     Status: Abnormal   Collection Time: 09/05/17  8:15 AM  Result Value Ref Range   Glucose-Capillary 161 (H) 65 - 99 mg/dL  Glucose, capillary     Status: Abnormal   Collection Time: 09/05/17  9:04 AM  Result Value Ref Range   Glucose-Capillary 161 (H) 65 - 99 mg/dL  Glucose, capillary     Status: Abnormal   Collection Time: 09/05/17 10:03 AM  Result Value Ref Range   Glucose-Capillary 194 (H) 65 - 99 mg/dL   Glucose, capillary     Status: Abnormal   Collection Time: 09/05/17 11:33 AM  Result Value Ref Range   Glucose-Capillary 194 (H) 65 - 99 mg/dL  Glucose, capillary     Status: Abnormal   Collection Time: 09/05/17  4:11 PM  Result Value Ref Range   Glucose-Capillary 285 (H) 65 - 99 mg/dL  Glucose, capillary     Status: Abnormal   Collection Time: 09/05/17 10:41 PM  Result Value Ref Range   Glucose-Capillary 324 (H) 65 - 99 mg/dL  Valproic acid level     Status: Abnormal   Collection Time: 09/06/17  4:41 AM  Result Value Ref Range   Valproic Acid Lvl 43 (L) 50.0 - 100.0 ug/mL  Basic metabolic panel     Status: Abnormal   Collection Time: 09/06/17  4:41 AM  Result Value Ref Range   Sodium 137 135 - 145 mmol/L   Potassium 4.1 3.5 - 5.1 mmol/L   Chloride 105 101 - 111 mmol/L   CO2 23 22 - 32 mmol/L   Glucose, Bld 312 (H) 65 - 99 mg/dL   BUN 23 (H) 6 - 20 mg/dL   Creatinine, Ser 1.07 0.61 - 1.24 mg/dL   Calcium 8.4 (L) 8.9 - 10.3 mg/dL   GFR calc non Af Amer >60 >60 mL/min   GFR calc Af Amer >60 >60 mL/min    Comment: (NOTE) The eGFR has been calculated using the CKD EPI equation. This calculation has not been validated in all clinical situations. eGFR's persistently <60 mL/min signify possible Chronic Kidney Disease.    Anion gap 9 5 -  15  CBC     Status: Abnormal   Collection Time: 09/06/17  4:41 AM  Result Value Ref Range   WBC 15.5 (H) 3.8 - 10.6 K/uL   RBC 4.92 4.40 - 5.90 MIL/uL   Hemoglobin 14.5 13.0 - 18.0 g/dL   HCT 43.5 40.0 - 52.0 %   MCV 88.6 80.0 - 100.0 fL   MCH 29.6 26.0 - 34.0 pg   MCHC 33.4 32.0 - 36.0 g/dL   RDW 13.8 11.5 - 14.5 %   Platelets 306 150 - 440 K/uL  Glucose, capillary     Status: Abnormal   Collection Time: 09/06/17  7:17 AM  Result Value Ref Range   Glucose-Capillary 267 (H) 65 - 99 mg/dL  Culture, respiratory (NON-Expectorated)     Status: None (Preliminary result)   Collection Time: 09/06/17  9:00 AM  Result Value Ref Range    Specimen Description TRACHEAL ASPIRATE    Special Requests NONE    Gram Stain      ABUNDANT WBC PRESENT,BOTH PMN AND MONONUCLEAR FEW GRAM VARIABLE ROD    Culture PENDING    Report Status PENDING   Glucose, capillary     Status: Abnormal   Collection Time: 09/06/17 11:10 AM  Result Value Ref Range   Glucose-Capillary 211 (H) 65 - 99 mg/dL  Glucose, capillary     Status: Abnormal   Collection Time: 09/06/17 12:59 PM  Result Value Ref Range   Glucose-Capillary 279 (H) 65 - 99 mg/dL  Glucose, capillary     Status: Abnormal   Collection Time: 09/06/17  1:54 PM  Result Value Ref Range   Glucose-Capillary 248 (H) 65 - 99 mg/dL  Glucose, capillary     Status: Abnormal   Collection Time: 09/06/17  3:14 PM  Result Value Ref Range   Glucose-Capillary 226 (H) 65 - 99 mg/dL  Glucose, capillary     Status: Abnormal   Collection Time: 09/06/17  4:12 PM  Result Value Ref Range   Glucose-Capillary 215 (H) 65 - 99 mg/dL  Glucose, capillary     Status: Abnormal   Collection Time: 09/06/17  5:10 PM  Result Value Ref Range   Glucose-Capillary 161 (H) 65 - 99 mg/dL    Current Facility-Administered Medications  Medication Dose Route Frequency Provider Last Rate Last Dose  . acetaminophen (TYLENOL) tablet 650 mg  650 mg Oral Q6H PRN Harrie Foreman, MD       Or  . acetaminophen (TYLENOL) suppository 650 mg  650 mg Rectal Q6H PRN Harrie Foreman, MD   650 mg at 09/05/17 2302  . [START ON 09/07/2017] aspirin chewable tablet 81 mg  81 mg Per Tube Daily Nettie Elm, MD      . atorvastatin (LIPITOR) tablet 80 mg  80 mg Per Tube q1800 Nettie Elm, MD   80 mg at 09/06/17 1731  . [START ON 09/07/2017] bromocriptine (PARLODEL) tablet 12.5 mg  12.5 mg Per Tube Daily Nettie Elm, MD      . ceftAZIDime (FORTAZ) 2 gram/50 mL D5W IVPB - DUPLEX  2 g Intravenous Q8H Fritzi Mandes, MD   Stopped at 09/06/17 1516  . chlorhexidine (PERIDEX) 0.12 % solution 15 mL  15 mL Mouth Rinse BID Fritzi Mandes, MD   15 mL at  09/06/17 0745  . enoxaparin (LOVENOX) injection 40 mg  40 mg Subcutaneous Q24H Fritzi Mandes, MD   40 mg at 09/06/17 6073  . feeding supplement (PRO-STAT SUGAR FREE 64) liquid 30 mL  30 mL  Per Tube Daily Nettie Elm, MD   30 mL at 09/06/17 1731  . feeding supplement (VITAL 1.5 CAL) liquid 1,000 mL  1,000 mL Per Tube Continuous Nettie Elm, MD 55 mL/hr at 09/06/17 1733 1,000 mL at 09/06/17 1733  . hydrALAZINE (APRESOLINE) injection 10 mg  10 mg Intravenous Q2H PRN Flora Lipps, MD   10 mg at 09/04/17 0055  . insulin regular (NOVOLIN R,HUMULIN R) 100 Units in sodium chloride 0.9 % 100 mL (1 Units/mL) infusion   Intravenous Continuous Nettie Elm, MD 6.2 mL/hr at 09/06/17 1618 6.2 Units/hr at 09/06/17 1618  . [START ON 09/07/2017] lisinopril (PRINIVIL,ZESTRIL) tablet 20 mg  20 mg Per Tube Daily Nettie Elm, MD      . LORazepam (ATIVAN) injection 2 mg  2 mg Intravenous PRN Wilhelmina Mcardle, MD   2 mg at 09/04/17 0228  . MEDLINE mouth rinse  15 mL Mouth Rinse q12n4p Fritzi Mandes, MD   15 mL at 09/06/17 1732  . metoprolol tartrate (LOPRESSOR) injection 5 mg  5 mg Intravenous Q6H PRN Awilda Bill, NP      . mirtazapine (REMERON SOL-TAB) disintegrating tablet 15 mg  15 mg Per NG tube QHS Nou Chard T, MD      . ondansetron (ZOFRAN) injection 4 mg  4 mg Intravenous Q6H PRN Harrie Foreman, MD      . pantoprazole (PROTONIX) injection 40 mg  40 mg Intravenous Q24H Nettie Elm, MD   40 mg at 09/06/17 0904  . valproate (DEPACON) 1,000 mg in dextrose 5 % 50 mL IVPB  1,000 mg Intravenous Q12H Awilda Bill, NP   Stopped at 09/06/17 1117    Musculoskeletal: Strength & Muscle Tone: decreased Gait & Station: unsteady Patient leans: N/A  Psychiatric Specialty Exam: Physical Exam  Nursing note and vitals reviewed. Constitutional: He appears well-developed.  HENT:  Head: Normocephalic and atraumatic.  Eyes: Conjunctivae are normal. Pupils are equal, round, and reactive to light.  Neck: Normal  range of motion.  Cardiovascular: Normal heart sounds.  Respiratory: He is in respiratory distress.  GI: Soft.  Musculoskeletal: Normal range of motion.  Neurological: He is alert.  Skin: Skin is warm and dry.  Psychiatric: His affect is blunt. He is slowed. Cognition and memory are impaired. He is noncommunicative.    Review of Systems  Constitutional: Negative.   HENT: Negative.   Eyes: Negative.   Respiratory: Negative.   Cardiovascular: Negative.   Gastrointestinal: Negative.   Musculoskeletal: Negative.   Skin: Negative.   Neurological: Negative.   Psychiatric/Behavioral: Positive for depression and memory loss. Negative for hallucinations, substance abuse and suicidal ideas. The patient is not nervous/anxious and does not have insomnia.     Blood pressure 130/65, pulse 86, temperature 98.1 F (36.7 C), temperature source Axillary, resp. rate (!) 23, height 5' 9" (1.753 m), weight 103.6 kg (228 lb 6.3 oz), SpO2 95 %.Body mass index is 33.73 kg/m.  General Appearance: Disheveled  Eye Contact:  Fair  Speech:  Garbled  Volume:  Decreased  Mood:  Dysphoric  Affect:  Congruent  Thought Process:  NA  Orientation:  Negative  Thought Content:  Negative  Suicidal Thoughts:  No  Homicidal Thoughts:  No  Memory:  Immediate;   Fair Recent;   Poor Remote;   Fair  Judgement:  Intact  Insight:  Fair  Psychomotor Activity:  Decreased  Concentration:  Concentration: Fair  Recall:  [employer] of Knowledge:  Fair  Language:  Poor  Akathisia:  No  Handed:  Right  AIMS (if indicated):     Assets:  Resilience  ADL's:  Impaired  Cognition:  Impaired,  Mild and Moderate  Sleep:        Treatment Plan Summary: Daily contact with patient to assess and evaluate symptoms and progress in treatment, Medication management and Plan Patient seems to be gradually improving but still is in the intensive care unit and now has a feeding tube in.  He was able to communicate a little bit better  but his speech is still so difficult to understand it is hard for me to know just how clearly he is thinking.  His affect is dysphoric and he seems to indicate some apathy about his situation.  I think it is appropriate at this point to try adding antidepressant medicine.  Because of the presence of the tube a good choice would be mirtazapine dissolvable tablets.  These can be easily administered down the feeding tube and also have the benefit of possibly helping to improve appetite and help with sleep.  I will start with a low dose of 15 mg.  Case will be signed out to the psychiatrist on call over the weekend.  Disposition: Patient does not meet criteria for psychiatric inpatient admission. Supportive therapy provided about ongoing stressors.  Alethia Berthold, MD 09/06/2017 5:44 PM

## 2017-09-06 NOTE — Progress Notes (Signed)
[facility] Consultation     Date: 09/06/2017,   MRN# [id number] Andrew Horton June 02, 1946 Code Status:     Code Status Orders  (From admission, onward)        Start     Ordered   08/27/17 0250  Full code  Continuous     08/27/17 0249    Code Status History    Date Active Date Inactive Code Status Order ID Comments User Context   This patient has a current code status but no historical code status.     Hosp day:@LENGTHOFSTAYDAYS @ Referring MD: @ATDPROV @     PCP:      AdmissionWeight: 235 lb (106.6 kg)                 CurrentWeight: 228 lb 6.3 oz (103.6 kg) Andrew Horton is a 71 y.o. old male      CHIEF COMPLAINT:   57 M with hx or prolactinoma, no prior seizure history, admitted with status epilepticus and acute encephalopathy. Intubated in ED. Extubated 11/20 AM. Persistent encephalopathy requiring re-intubation 11/21 early AM. Patient self extubated since then and is now on RA.   SUBJECTIVE:   Patient desaturated yesterday evening requiring nasopharyngeal suctioning and increase in supplemental FiO2  No acute issues overnight. FiO2 weaned down to 3LPM this AM. Awake, follows commands. Denies SOB, chest pain. His cough is effective but requires significant encouragement   MEDICATIONS    Current Medication:   Current Facility-Administered Medications:  .  acetaminophen (TYLENOL) tablet 650 mg, 650 mg, Oral, Q6H PRN **OR** acetaminophen (TYLENOL) suppository 650 mg, 650 mg, Rectal, Q6H PRN, Harrie Foreman, MD, 650 mg at 09/05/17 2302 .  aspirin chewable tablet 81 mg, 81 mg, Oral, Daily, Wilhelmina Mcardle, MD, Stopped at 09/06/17 [phone] .  bromocriptine (PARLODEL) tablet 12.5 mg, 12.5 mg, Oral, Daily, Wilhelmina Mcardle, MD, Stopped at 09/06/17 [phone] .  ceftAZIDime (FORTAZ) 2 gram/50 mL D5W IVPB - DUPLEX, 2 g, Intravenous, Q8H, Fritzi Mandes, MD, Stopped at 09/06/17 [phone] .  chlorhexidine (PERIDEX) 0.12 % solution 15  mL, 15 mL, Mouth Rinse, BID, Fritzi Mandes, MD, 15 mL at 09/06/17 0745 .  enoxaparin (LOVENOX) injection 40 mg, 40 mg, Subcutaneous, Q24H, Fritzi Mandes, MD, 40 mg at 09/06/17 0852 .  hydrALAZINE (APRESOLINE) injection 10 mg, 10 mg, Intravenous, Q2H PRN, Flora Lipps, MD, 10 mg at 09/04/17 0055 .  insulin glargine (LANTUS) injection 40 Units, 40 Units, Subcutaneous, Daily, Wilhelmina Mcardle, MD, 40 Units at 09/06/17 0160 .  insulin regular (NOVOLIN R,HUMULIN R) 100 Units in sodium chloride 0.9 % 100 mL (1 Units/mL) infusion, , Intravenous, Continuous, Nettie Elm, MD .  lisinopril (PRINIVIL,ZESTRIL) tablet 20 mg, 20 mg, Oral, Daily, Wilhelmina Mcardle, MD, Stopped at 09/06/17 [phone] .  LORazepam (ATIVAN) injection 2 mg, 2 mg, Intravenous, PRN, Wilhelmina Mcardle, MD, 2 mg at 09/04/17 0228 .  MEDLINE mouth rinse, 15 mL, Mouth Rinse, q12n4p, Fritzi Mandes, MD, 15 mL at 09/05/17 1626 .  metoprolol tartrate (LOPRESSOR) injection 5 mg, 5 mg, Intravenous, Q6H PRN, Awilda Bill, NP .  [DISCONTINUED] ondansetron (ZOFRAN) tablet 4 mg, 4 mg, Oral, Q6H PRN **OR** ondansetron (ZOFRAN) injection 4 mg, 4 mg, Intravenous, Q6H PRN, Harrie Foreman, MD .  pantoprazole (PROTONIX) injection 40 mg, 40 mg, Intravenous, Q24H, Nettie Elm, MD, 40 mg at 09/06/17 0904 .  valproate (DEPACON) 1,000 mg in dextrose 5 % 50 mL IVPB, 1,000 mg, Intravenous, Q12H, Blakeney, Dreama Saa,  NP, Stopped at 09/06/17 1117     REVIEW OF SYSTEMS     VS: BP 122/79   Pulse 94   Temp 98.3 F (36.8 C) (Oral)   Resp (!) 28   Ht 5\' 9"  (1.753 m)   Wt 228 lb 6.3 oz (103.6 kg)   SpO2 97%   BMI 33.73 kg/m      PHYSICAL EXAM   Physical Exam Awake, follows commands but requires encouragement. CVS: S1, S2, 0 Chest: bilateral rhonchi with rattling. No wheezes on rales. Abd: Soft, NT, ND. LE: no edema.     LABS    Recent Labs    09/04/17 1629 09/06/17 0441  HGB  --  14.5  HCT  --  43.5  MCV  --  88.6  WBC  --  15.5*  BUN 18 23*   CREATININE 1.00 1.07  GLUCOSE 308* 312*  CALCIUM 8.6* 8.4*  ,    No results for input(s): PH in the last 72 hours.  Invalid input(s): PCO2, PO2, BASEEXCESS, BASEDEFICITE, TFT    CULTURE RESULTS   Recent Results (from the past 240 hour(s))  Culture, respiratory (NON-Expectorated)     Status: None   Collection Time: 09/01/17 12:30 AM  Result Value Ref Range Status   Specimen Description TRACHEAL ASPIRATE  Final   Special Requests NONE  Final   Gram Stain   Final    ABUNDANT WBC PRESENT, PREDOMINANTLY PMN RARE SQUAMOUS EPITHELIAL CELLS PRESENT ABUNDANT GRAM POSITIVE RODS FEW GRAM NEGATIVE RODS    Culture   Final    Consistent with normal respiratory flora. Performed at [facility], [place] [address], [place], [place] [address]    Report Status 09/03/2017 FINAL  Final  Urine Culture     Status: None   Collection Time: 09/01/17 12:39 AM  Result Value Ref Range Status   Specimen Description URINE, RANDOM  Final   Special Requests NONE  Final   Culture   Final    NO GROWTH Performed at [facility], [place] [address] [place], [place] [address]    Report Status 09/02/2017 FINAL  Final  Culture, blood (Routine X 2) w Reflex to ID Panel     Status: None   Collection Time: 09/01/17 12:46 AM  Result Value Ref Range Status   Specimen Description BLOOD BLOOD LEFT HAND  Final   Special Requests   Final    BOTTLES DRAWN AEROBIC AND ANAEROBIC Blood Culture adequate volume   Culture NO GROWTH 5 DAYS  Final   Report Status 09/06/2017 FINAL  Final  Culture, blood (Routine X 2) w Reflex to ID Panel     Status: None   Collection Time: 09/01/17  1:00 AM  Result Value Ref Range Status   Specimen Description BLOOD RIGHT ANTECUBITAL  Final   Special Requests   Final    BOTTLES DRAWN AEROBIC AND ANAEROBIC Blood Culture adequate volume   Culture NO GROWTH 5 DAYS  Final   Report Status 09/06/2017 FINAL  Final  CULTURE, BLOOD (ROUTINE X 2) w Reflex to ID Panel     Status: None  (Preliminary result)   Collection Time: 09/04/17  6:15 PM  Result Value Ref Range Status   Specimen Description BLOOD RIGHT HAND  Final   Special Requests   Final    BOTTLES DRAWN AEROBIC AND ANAEROBIC Blood Culture adequate volume   Culture NO GROWTH 2 DAYS  Final   Report Status PENDING  Incomplete  CULTURE, BLOOD (ROUTINE  X 2) w Reflex to ID Panel     Status: None (Preliminary result)   Collection Time: 09/04/17  6:22 PM  Result Value Ref Range Status   Specimen Description BLOOD LEFT HAND  Final   Special Requests   Final    BOTTLES DRAWN AEROBIC AND ANAEROBIC Blood Culture adequate volume   Culture NO GROWTH 2 DAYS  Final   Report Status PENDING  Incomplete  Urine Culture     Status: Abnormal (Preliminary result)   Collection Time: 09/05/17 12:43 AM  Result Value Ref Range Status   Specimen Description URINE, RANDOM  Final   Special Requests NONE  Final   Culture (A)  Final    >=100,000 COLONIES/mL UNIDENTIFIED ORGANISM Performed at [facility], [place] [address], [place], [place] [address]    Report Status PENDING  Incomplete          IMAGING    Dg Chest Port 1 View  Result Date: 09/06/2017 CLINICAL DATA:  Hypoxia. EXAM: PORTABLE CHEST 1 VIEW COMPARISON:  09/05/2017. FINDINGS: Mediastinum and hilar structures normal. Cardiomegaly. Persistent bibasilar infiltrates. No significant change from prior exam. No pleural effusion or pneumothorax. IMPRESSION: Persistent bibasilar infiltrates.  No change from prior exam. Electronically Signed   By: Marcello Moores  Register   On: 09/06/2017 10:01         ASSESSMENT/PLAN    47 M with hx or prolactinoma, no prior seizure history, admitted with status epilepticus and acute encephalopathy. Intubated in ED. Extubated 11/20 AM. Persistent encephalopathy requiring re-intubation 11/21 early AM. Patient self extubated since then.  Problem list: New onset seizures Dysphagia due to encephalopathy DM2 with hyperglycemia Acute  encephalopathy, much improved Deconditioning Intracranial mass on MRI   Resolved issues: Status epilepticus Acute ventilator dependent respiratory failure, resolved Hypotension, resolved AKI, nonoliguric, resolved   Plan:  Appreciate neurology's help.  Continue valproic acid dosing per neurology. Will discuss with neurology if anything needs to be done for the intracranial mass.   Patient has acute O2 desaturation requiring nasopharyngeal suctioning. He is able to cough but needs encouragement. Failed swallow evaluation this AM and is aspirating as well. Continue chest PT and nasopharyngeal suctioning. NPO. Place DHT for feeding and enteric meds. Lasix 40 mg IV x 1 Send sputum cultures. Check CXR.  Continue fortaz  Continue bromoctriptine  Continune lisinopril  Continue metoprolol  Re-start home lipitor.  Continue ASA.  Continue holding plavix as we don't know what it was meant for and for any possible invasive interventions.   Remains hyperglycemic despite being on lantus and insulin  SS. Start Insulin drip for hyperglydcemia.  Continue lovenox for DVT prophylxis. Protonix for SUP.   Case management to assess for placement to LTAC.  Continue care in the step down today. If remains stable, he should be able to go to the floor tomorrow.    Nettie Elm, M.D.  Pulmonary & Critical Care Medicine

## 2017-09-06 NOTE — Progress Notes (Signed)
ANTIBIOTIC CONSULT NOTE - INITIAL  Pharmacy Consult for Ceftazidime  Indication: pneumonia  Allergies  Allergen Reactions  . Sulfa Antibiotics Hives    Patient Measurements: Height: 5\' 9"  (175.3 cm) Weight: 228 lb 6.3 oz (103.6 kg) IBW/kg (Calculated) : 70.7 Adjusted Body Weight:   Vital Signs: Temp: 98.3 F (36.8 C) (11/30 0800) Temp Source: Oral (11/30 0800) BP: 122/79 (11/30 1100) Pulse Rate: 94 (11/30 1100) Intake/Output from previous day: 11/29 0701 - 11/30 0700 In: 178.6 [I.V.:18.6; IV Piggyback:160] Out: 300 [Urine:300] Intake/Output from this shift: Total I/O In: 100 [IV Piggyback:100] Out: -   Labs: Recent Labs    09/04/17 1629 09/06/17 0441  WBC  --  15.5*  HGB  --  14.5  PLT  --  306  CREATININE 1.00 1.07   Estimated Creatinine Clearance: 75.1 mL/min (by C-G formula based on SCr of 1.07 mg/dL). No results for input(s): VANCOTROUGH, VANCOPEAK, VANCORANDOM, GENTTROUGH, GENTPEAK, GENTRANDOM, TOBRATROUGH, TOBRAPEAK, TOBRARND, AMIKACINPEAK, AMIKACINTROU, AMIKACIN in the last 72 hours.   Microbiology: Recent Results (from the past 720 hour(s))  MRSA PCR Screening     Status: None   Collection Time: 08/27/17  3:24 AM  Result Value Ref Range Status   MRSA by PCR NEGATIVE NEGATIVE Final    Comment:        The GeneXpert MRSA Assay (FDA approved for NASAL specimens only), is one component of a comprehensive MRSA colonization surveillance program. It is not intended to diagnose MRSA infection nor to guide or monitor treatment for MRSA infections.   Culture, respiratory (NON-Expectorated)     Status: None   Collection Time: 09/01/17 12:30 AM  Result Value Ref Range Status   Specimen Description TRACHEAL ASPIRATE  Final   Special Requests NONE  Final   Gram Stain   Final    ABUNDANT WBC PRESENT, PREDOMINANTLY PMN RARE SQUAMOUS EPITHELIAL CELLS PRESENT ABUNDANT GRAM POSITIVE RODS FEW GRAM NEGATIVE RODS    Culture   Final    Consistent with normal  respiratory flora. Performed at [facility], [place] [address] [place], [place] [address]    Report Status 09/03/2017 FINAL  Final  Urine Culture     Status: None   Collection Time: 09/01/17 12:39 AM  Result Value Ref Range Status   Specimen Description URINE, RANDOM  Final   Special Requests NONE  Final   Culture   Final    NO GROWTH Performed at [facility], [place] [address] [place], [place] [address]    Report Status 09/02/2017 FINAL  Final  Culture, blood (Routine X 2) w Reflex to ID Panel     Status: None   Collection Time: 09/01/17 12:46 AM  Result Value Ref Range Status   Specimen Description BLOOD BLOOD LEFT HAND  Final   Special Requests   Final    BOTTLES DRAWN AEROBIC AND ANAEROBIC Blood Culture adequate volume   Culture NO GROWTH 5 DAYS  Final   Report Status 09/06/2017 FINAL  Final  Culture, blood (Routine X 2) w Reflex to ID Panel     Status: None   Collection Time: 09/01/17  1:00 AM  Result Value Ref Range Status   Specimen Description BLOOD RIGHT ANTECUBITAL  Final   Special Requests   Final    BOTTLES DRAWN AEROBIC AND ANAEROBIC Blood Culture adequate volume   Culture NO GROWTH 5 DAYS  Final   Report Status 09/06/2017 FINAL  Final  CULTURE, BLOOD (ROUTINE X 2) w Reflex to ID Panel  Status: None (Preliminary result)   Collection Time: 09/04/17  6:15 PM  Result Value Ref Range Status   Specimen Description BLOOD RIGHT HAND  Final   Special Requests   Final    BOTTLES DRAWN AEROBIC AND ANAEROBIC Blood Culture adequate volume   Culture NO GROWTH 2 DAYS  Final   Report Status PENDING  Incomplete  CULTURE, BLOOD (ROUTINE X 2) w Reflex to ID Panel     Status: None (Preliminary result)   Collection Time: 09/04/17  6:22 PM  Result Value Ref Range Status   Specimen Description BLOOD LEFT HAND  Final   Special Requests   Final    BOTTLES DRAWN AEROBIC AND ANAEROBIC Blood Culture adequate volume   Culture NO GROWTH 2 DAYS  Final   Report Status PENDING   Incomplete  Urine Culture     Status: Abnormal (Preliminary result)   Collection Time: 09/05/17 12:43 AM  Result Value Ref Range Status   Specimen Description URINE, RANDOM  Final   Special Requests NONE  Final   Culture (A)  Final    >=100,000 COLONIES/mL UNIDENTIFIED ORGANISM Performed at [facility], [place] [address] [place], [place] [address]    Report Status PENDING  Incomplete  Culture, respiratory (NON-Expectorated)     Status: None (Preliminary result)   Collection Time: 09/06/17  9:00 AM  Result Value Ref Range Status   Specimen Description TRACHEAL ASPIRATE  Final   Special Requests NONE  Final   Gram Stain   Final    ABUNDANT WBC PRESENT,BOTH PMN AND MONONUCLEAR FEW GRAM VARIABLE ROD    Culture PENDING  Incomplete   Report Status PENDING  Incomplete    Medical History: Past Medical History:  Diagnosis Date  . Brain tumor (benign) ([place])     Medications:  Medications Prior to Admission  Medication Sig Dispense Refill Last Dose  . atorvastatin (LIPITOR) 80 MG tablet Take 80 mg daily by mouth.   unknown at unknown  . bromocriptine (PARLODEL) 2.5 MG tablet Take 12.5 mg daily by mouth.   unknown at unknown  . clopidogrel (PLAVIX) 75 MG tablet Take 75 mg daily by mouth.   unknown at unknown  . linagliptin (TRADJENTA) 5 MG TABS tablet Take 5 mg daily by mouth.   unknown at unknown  . lisinopril (PRINIVIL,ZESTRIL) 5 MG tablet Take 5 mg daily by mouth.   unknown at unknown  . metFORMIN (GLUCOPHAGE-XR) 500 MG 24 hr tablet Take 1,000 mg 2 (two) times daily by mouth.   unknown at unknown  . metoprolol succinate (TOPROL-XL) 25 MG 24 hr tablet Take 25 mg daily by mouth.   unknown at unknown  . nitroGLYCERIN (NITROSTAT) 0.4 MG SL tablet Place 0.4 mg every 5 (five) minutes as needed under the tongue for chest pain.   prn at prn   Scheduled:  . [START ON 09/07/2017] aspirin  81 mg Per Tube Daily  . atorvastatin  80 mg Per Tube q1800  . [START ON 09/07/2017] bromocriptine   12.5 mg Per Tube Daily  . chlorhexidine  15 mL Mouth Rinse BID  . enoxaparin (LOVENOX) injection  40 mg Subcutaneous Q24H  . [START ON 09/07/2017] lisinopril  20 mg Per Tube Daily  . mouth rinse  15 mL Mouth Rinse q12n4p  . pantoprazole (PROTONIX) IV  40 mg Intravenous Q24H   Assessment: Pharmacy consulted to dose and monitor Ceftazidime in this 71 year old woman being treated for pneumonia.  Goal of Therapy:  Plan:  Will continue Ceftazidime 2 g IV q8 hours.   Ulice Dash D 09/06/2017,12:41 PM

## 2017-09-06 NOTE — Progress Notes (Signed)
[facility] Progress Note Patient Name: Andrew Horton DOB: 07-19-1946 MRN: [id number]   Date of Service  09/06/2017  HPI/Events of Note  Notified of urine culture positive for Enterococcus faecalis. Currently only on Fortaz. No sensitivities yet.   eICU Interventions  1. Ordering vancomycin per pharmacy consult      Intervention Category Major Interventions: Sepsis - evaluation and management  Tera Partridge 09/06/2017, 8:26 PM

## 2017-09-06 NOTE — Evaluation (Signed)
Objective Swallowing Evaluation: Type of Study: MBS-Modified Barium Swallow Study   Patient Details  Name: Andrew Horton MRN: [id number] Date of Birth: 10/30/1945  Today's Date: 09/06/2017 Time: SLP Start Time (ACUTE ONLY): 0930 -SLP Stop Time (ACUTE ONLY): 1030  SLP Time Calculation (min) (ACUTE ONLY): 60 min   Past Medical History:  Past Medical History:  Diagnosis Date  . Brain tumor (benign) ([facility])    HPI: Pt is a 71 y/o male with past medical history of brain tumor, DM on insulin, depression([facility] chart) who presents to the emergency department after a new onset seizure.  He was found bathroom of a gas station in a postictal state.  Upon arrival to the emergency department the patient was verbal but again seized.  He received Ativan but sees yet again.  CT of the head show no acute intracranial problem.  However, the patient continued to exhibit posturing and there was concern for airway protection which prompted the emergency department staff to intubate. Pt was orally intubated for 2 days then trial of extubation was attempted. Pt required reintubation the next day and remained orally intubated for 4 days until self-extubated. Pt has received nutritional support. Pt has had periods of agitation and confusion per staff report. Pt initially tolerated the dysphagia diet ordered post BSE on 09/03/17 however, pt has been intermittently too drowsy for safe oral intake(NSG was recommended to carefully monitor pt's alertness for oral intake).    Subjective: pt awake but often closing eyes during study; required min-mod verbal cues for follow through w/ tasks. Pt mouthed to communicate but did not attempt to phonate/verbalize though extubation has been several days. Per NSG report, pt is exhibiting cognitive issues/decline.    Assessment / Plan / Recommendation  CHL IP CLINICAL IMPRESSIONS 09/06/2017  Clinical Impression Pt appears to present w/ MODERATE oropharyngeal phase dysphagia  impacted by Cognitive status, state, and recent trauma of oral intubation(decreased pharyngeal and laryngeal sensation). He is at [facility] risk for Laryngeal Penetration w/ Aspiration(to follow). During the Oral phase, pt exhibited slow motor movements w/ min-mod oral holding and/or delay in A-P transit; reduced attention to the task and bolus management. Bolus cohesion was reduced and dispersed from the mid oral cavity spilling into the pharynx to the pyriform sinuses w/ the nectar and Honey consistency liquids as well as puree intermittently. Moderate oral residue remained along mid-tongue to BOT - appeared to have reduced effort and BOT retraction during the swallowing which improved when pt was instructed to use an Effortful swallow. During the Pharyngeal phase, pt exhibited min delay in pharyngeal swallow initiation w/ all trial consistencies(puree, Honey consistency liquid); noted inconsistent spilling of the Nectar consistency liquid(increased texture/weight) to the pyriform sinuses during pharyngeal swallow initiation. No laryngeal penetration or aspiration was noted before/during the swallowing. Trials were presented via TSP only. However, noted decreased pharyngeal sensation to bolus residue on the BOT and Valleculae remaining post swallows. Pt did not attempt a f/u, dry swallow to clear this until given mod verbal cues. During the pharyngeal swallow, pt exhibited reduced effort in pharyngeal pressure and laryngeal excursion resulting in MODERATE pharyngeal residue post swallow; again, pt did not follow w/ a reflexive dry swallow to clear this - suspect decreased pharyngeal sensation which could be impacted by the oral intubation. As the bolus residue remained and increased somewhat in the mid-lower pharynx, bolus residue seeped into the laryngeal vestibule becoming laryngeal Penetration followed by Aspiration from the Penetration. Pt was insensate to the Penetration and Aspiration and  did not exhibit a  protective cough in attempt to clear his airway. Suspect pt's drowsiness and decreased sensation/awareness will significantly increase pt's risk for laryngeal penetration and aspiration in small amounts no matter what the bolus consistency. Of note, pt was not consistent in follow through w/ instruction/commands to use a strong, effortful swallow or cough; he often became drowsy and closed his eyes but would nod his head he heard/understood(unsure of his level of full comprehension).  SLP Visit Diagnosis Dysphagia, oropharyngeal phase (R13.12)  Attention and concentration deficit following --  Frontal lobe and executive function deficit following --  Impact on safety and function Moderate aspiration risk      CHL IP TREATMENT RECOMMENDATION 09/06/2017  Treatment Recommendations Patient unable to participate in swallow therapy at this time     Prognosis 09/06/2017  Prognosis for Safe Diet Advancement Guarded  Barriers to Reach Goals Cognitive deficits;Time post onset;Severity of deficits;Behavior  Barriers/Prognosis Comment --    CHL IP DIET RECOMMENDATION 09/06/2017  SLP Diet Recommendations NPO;Alternative means - temporary  Liquid Administration via (No Data)  Medication Administration Via alternative means  Compensations (No Data)  Postural Changes (No Data)      CHL IP OTHER RECOMMENDATIONS 09/06/2017  Recommended Consults (No Data)  Oral Care Recommendations Oral care QID;Staff/trained caregiver to provide oral care  Other Recommendations (No Data)      CHL IP FOLLOW UP RECOMMENDATIONS 09/06/2017  Follow up Recommendations Skilled Nursing facility;LTACH      CHL IP FREQUENCY AND DURATION 09/06/2017  Speech Therapy Frequency (ACUTE ONLY) (No Data)  Treatment Duration (No Data)           CHL IP ORAL PHASE 09/06/2017  Oral Phase Impaired  Oral - Pudding Teaspoon --  Oral - Pudding Cup --  Oral - Honey Teaspoon --  Oral - Honey Cup --  Oral - Nectar Teaspoon --  Oral  - Nectar Cup --  Oral - Nectar Straw --  Oral - Thin Teaspoon --  Oral - Thin Cup --  Oral - Thin Straw --  Oral - Puree --  Oral - Mech Soft --  Oral - Regular --  Oral - Multi-Consistency --  Oral - Pill --  Oral Phase - Comment pt exhibited slow motor movements w/ min-mod oral holding and/or delay in A-P transit; reduced attention to the task and bolus management. Bolus cohesion was reduced and dispersed from the mid oral cavity spilling into the pharynx to the pyriform sinuses w/ the nectar and Honey consistency liquids as well as puree intermittently. Moderate oral residue remained along mid-tongue to BOT - appeared to have reduced effort and BOT retraction during the swallowing which improved when pt was instructed to use an Effortful swallow.     CHL IP PHARYNGEAL PHASE 09/06/2017  Pharyngeal Phase Impaired  Pharyngeal- Pudding Teaspoon --  Pharyngeal --  Pharyngeal- Pudding Cup --  Pharyngeal --  Pharyngeal- Honey Teaspoon --  Pharyngeal --  Pharyngeal- Honey Cup --  Pharyngeal --  Pharyngeal- Nectar Teaspoon --  Pharyngeal --  Pharyngeal- Nectar Cup --  Pharyngeal --  Pharyngeal- Nectar Straw --  Pharyngeal --  Pharyngeal- Thin Teaspoon --  Pharyngeal --  Pharyngeal- Thin Cup --  Pharyngeal --  Pharyngeal- Thin Straw --  Pharyngeal --  Pharyngeal- Puree --  Pharyngeal --  Pharyngeal- Mechanical Soft --  Pharyngeal --  Pharyngeal- Regular --  Pharyngeal --  Pharyngeal- Multi-consistency --  Pharyngeal --  Pharyngeal- Pill --  Pharyngeal --  Pharyngeal Comment  pt exhibited min delay in pharyngeal swallow initiation w/ all trial consistencies(puree, Honey consistency liquid); noted inconsistent spilling of the Nectar consistency liquid(increased texture/weight) to the pyriform sinuses during pharyngeal swallow initiation. No laryngeal penetration or aspiration was noted before/during the swallowing. Trials were presented via TSP only. However, noted decreased  pharyngeal sensation to bolus residue on the BOT and Valleculae remaining post swallows. Pt did not attempt a f/u, dry swallow to clear this until given mod verbal cues. During the pharyngeal swallow, pt exhibited reduced effort in pharyngeal pressure and laryngeal excursion resulting in MODERATE pharyngeal residue post swallow; again, pt did not follow w/ a reflexive dry swallow to clear this - suspect decreased pharyngeal sensation which could be impacted by the oral intubation. As the bolus residue remained and increased somewhat in the mid-lower pharynx, bolus residue seeped into the laryngeal vestibule becoming laryngeal Penetration followed by Aspiration from the Penetration. Pt was insensate to the Penetration and Aspiration and did not exhibit a protective cough in attempt to clear his airway. Suspect pt's drowsiness and decreased sensation/awareness will significantly increase pt's risk for laryngeal penetration and aspiration in small amounts no matter what the bolus consistency. Of note, pt was not consistent in follow through w/ instruction/commands to use a strong, effortful swallow or cough; he often became drowsy and closed his eyes but would nod his head he heard/understood(unsure of his level of full comprehension).      CHL IP CERVICAL ESOPHAGEAL PHASE 09/06/2017  Cervical Esophageal Phase WFL  Pudding Teaspoon --  Pudding Cup --  Honey Teaspoon --  Honey Cup --  Nectar Teaspoon --  Nectar Cup --  Nectar Straw --  Thin Teaspoon --  Thin Cup --  Thin Straw --  Puree --  Mechanical Soft --  Regular --  Multi-consistency --  Pill --  Cervical Esophageal Comment --    CHL IP GO 09/03/2017  Functional Assessment Tool Used clinical judgement  Functional Limitations Swallowing  Swallow Current Status (N8295) CK  Swallow Goal Status (A2130) [place]  Swallow Discharge Status (Q6578) CI  Motor Speech Current Status (I6962) (None)  Motor Speech Goal Status (X5284) (None)  Motor  Speech Goal Status (X3244) (None)  Spoken Language Comprehension Current Status (W1027) (None)  Spoken Language Comprehension Goal Status (O5366) (None)  Spoken Language Comprehension Discharge Status (Y4034) (None)  Spoken Language Expression Current Status (V4259) (None)  Spoken Language Expression Goal Status (D6387) (None)  Spoken Language Expression Discharge Status [id number]) (None)  Attention Current Status (I9518) (None)  Attention Goal Status (A4166) (None)  Attention Discharge Status (A6301) (None)  Memory Current Status (S0109) (None)  Memory Goal Status (N2355) (None)  Memory Discharge Status (D3220) (None)  Voice Current Status (U5427) (None)  Voice Goal Status (C6237) (None)  Voice Discharge Status (S2831) (None)  Other Speech-Language Pathology Functional Limitation Current Status (D1761) (None)  Other Speech-Language Pathology Functional Limitation Goal Status (Y0737) (None)  Other Speech-Language Pathology Functional Limitation Discharge Status [phone]) (None)        Subjective: Patient behavior: (alertness, ability to follow instructions, etc.): pt was awake but often drowsy and required verbal cues to follow through w/ tasks. He did not verbalize but nodded his head to instructions. Unsure of pt's Cognitive comprehension.  Chief complaint: dysphagia   Objective:  Radiological Procedure: A videoflouroscopic evaluation of oral-preparatory, reflex initiation, and pharyngeal phases of the swallow was performed; as well as a screening of the upper esophageal phase.  I. POSTURE: upright II. VIEW: lateral III. COMPENSATORY STRATEGIES:  f/u, dry swallows attempted; effortful cough/throat clears attempted; effortful swallows attempted - pt had poor, inconsistent follow through w/ instruction secondary to Cognitive status, drowsiness IV. BOLUSES ADMINISTERED:  Thin Liquid:  NONE  Nectar-thick Liquid:  3 by tsp  Honey-thick Liquid:  3 by tsp  Puree:  2 by tsp  Mechanical  Soft:  NONE        Orinda Kenner, MS, CCC-SLP Watson,Katherine 09/06/2017, 12:59 PM

## 2017-09-06 NOTE — Progress Notes (Signed)
ANTIBIOTIC CONSULT NOTE - INITIAL  Pharmacy Consult for Vancomycin  Indication: sepsis  Allergies  Allergen Reactions  . Sulfa Antibiotics Hives    Patient Measurements: Height: 5\' 9"  (175.3 cm) Weight: 228 lb 6.3 oz (103.6 kg) IBW/kg (Calculated) : 70.7 Adjusted Body Weight: 83.86 kg   Vital Signs: Temp: 98.3 F (36.8 C) (11/30 2000) Temp Source: Axillary (11/30 2000) BP: 110/60 (11/30 2000) Pulse Rate: 88 (11/30 2000) Intake/Output from previous day: 11/29 0701 - 11/30 0700 In: 178.6 [I.V.:18.6; IV Piggyback:160] Out: 300 [Urine:300] Intake/Output from this shift: Total I/O In: 44.1 [I.V.:6.2; NG/GT:37.9] Out: -   Labs: Recent Labs    09/04/17 1629 09/06/17 0441  WBC  --  15.5*  HGB  --  14.5  PLT  --  306  CREATININE 1.00 1.07   Estimated Creatinine Clearance: 75.1 mL/min (by C-G formula based on SCr of 1.07 mg/dL). No results for input(s): VANCOTROUGH, VANCOPEAK, VANCORANDOM, GENTTROUGH, GENTPEAK, GENTRANDOM, TOBRATROUGH, TOBRAPEAK, TOBRARND, AMIKACINPEAK, AMIKACINTROU, AMIKACIN in the last 72 hours.   Microbiology: Recent Results (from the past 720 hour(s))  MRSA PCR Screening     Status: None   Collection Time: 08/27/17  3:24 AM  Result Value Ref Range Status   MRSA by PCR NEGATIVE NEGATIVE Final    Comment:        The GeneXpert MRSA Assay (FDA approved for NASAL specimens only), is one component of a comprehensive MRSA colonization surveillance program. It is not intended to diagnose MRSA infection nor to guide or monitor treatment for MRSA infections.   Culture, respiratory (NON-Expectorated)     Status: None   Collection Time: 09/01/17 12:30 AM  Result Value Ref Range Status   Specimen Description TRACHEAL ASPIRATE  Final   Special Requests NONE  Final   Gram Stain   Final    ABUNDANT WBC PRESENT, PREDOMINANTLY PMN RARE SQUAMOUS EPITHELIAL CELLS PRESENT ABUNDANT GRAM POSITIVE RODS FEW GRAM NEGATIVE RODS    Culture   Final    Consistent  with normal respiratory flora. Performed at [facility], [place] [address] [place], [place] [address]    Report Status 09/03/2017 FINAL  Final  Urine Culture     Status: None   Collection Time: 09/01/17 12:39 AM  Result Value Ref Range Status   Specimen Description URINE, RANDOM  Final   Special Requests NONE  Final   Culture   Final    NO GROWTH Performed at [facility], [place] [address] [place], [place] [address]    Report Status 09/02/2017 FINAL  Final  Culture, blood (Routine X 2) w Reflex to ID Panel     Status: None   Collection Time: 09/01/17 12:46 AM  Result Value Ref Range Status   Specimen Description BLOOD BLOOD LEFT HAND  Final   Special Requests   Final    BOTTLES DRAWN AEROBIC AND ANAEROBIC Blood Culture adequate volume   Culture NO GROWTH 5 DAYS  Final   Report Status 09/06/2017 FINAL  Final  Culture, blood (Routine X 2) w Reflex to ID Panel     Status: None   Collection Time: 09/01/17  1:00 AM  Result Value Ref Range Status   Specimen Description BLOOD RIGHT ANTECUBITAL  Final   Special Requests   Final    BOTTLES DRAWN AEROBIC AND ANAEROBIC Blood Culture adequate volume   Culture NO GROWTH 5 DAYS  Final   Report Status 09/06/2017 FINAL  Final  CULTURE, BLOOD (ROUTINE X 2) w Reflex to  ID Panel     Status: None (Preliminary result)   Collection Time: 09/04/17  6:15 PM  Result Value Ref Range Status   Specimen Description BLOOD RIGHT HAND  Final   Special Requests   Final    BOTTLES DRAWN AEROBIC AND ANAEROBIC Blood Culture adequate volume   Culture NO GROWTH 2 DAYS  Final   Report Status PENDING  Incomplete  CULTURE, BLOOD (ROUTINE X 2) w Reflex to ID Panel     Status: None (Preliminary result)   Collection Time: 09/04/17  6:22 PM  Result Value Ref Range Status   Specimen Description BLOOD LEFT HAND  Final   Special Requests   Final    BOTTLES DRAWN AEROBIC AND ANAEROBIC Blood Culture adequate volume   Culture NO GROWTH 2 DAYS  Final   Report  Status PENDING  Incomplete  Urine Culture     Status: Abnormal (Preliminary result)   Collection Time: 09/05/17 12:43 AM  Result Value Ref Range Status   Specimen Description URINE, RANDOM  Final   Special Requests NONE  Final   Culture (A)  Final    >=100,000 COLONIES/mL ENTEROCOCCUS FAECALIS SUSCEPTIBILITIES TO FOLLOW Performed at [facility], [place] [address], [place], [place] [address]    Report Status PENDING  Incomplete  Culture, respiratory (NON-Expectorated)     Status: None (Preliminary result)   Collection Time: 09/06/17  9:00 AM  Result Value Ref Range Status   Specimen Description TRACHEAL ASPIRATE  Final   Special Requests NONE  Final   Gram Stain   Final    ABUNDANT WBC PRESENT,BOTH PMN AND MONONUCLEAR FEW GRAM VARIABLE ROD    Culture PENDING  Incomplete   Report Status PENDING  Incomplete    Medical History: Past Medical History:  Diagnosis Date  . Brain tumor (benign) ([place])     Medications:  Medications Prior to Admission  Medication Sig Dispense Refill Last Dose  . atorvastatin (LIPITOR) 80 MG tablet Take 80 mg daily by mouth.   unknown at unknown  . bromocriptine (PARLODEL) 2.5 MG tablet Take 12.5 mg daily by mouth.   unknown at unknown  . clopidogrel (PLAVIX) 75 MG tablet Take 75 mg daily by mouth.   unknown at unknown  . linagliptin (TRADJENTA) 5 MG TABS tablet Take 5 mg daily by mouth.   unknown at unknown  . lisinopril (PRINIVIL,ZESTRIL) 5 MG tablet Take 5 mg daily by mouth.   unknown at unknown  . metFORMIN (GLUCOPHAGE-XR) 500 MG 24 hr tablet Take 1,000 mg 2 (two) times daily by mouth.   unknown at unknown  . metoprolol succinate (TOPROL-XL) 25 MG 24 hr tablet Take 25 mg daily by mouth.   unknown at unknown  . nitroGLYCERIN (NITROSTAT) 0.4 MG SL tablet Place 0.4 mg every 5 (five) minutes as needed under the tongue for chest pain.   prn at prn   Assessment: CrCl = 75.1 ml/min ke = 0.067 hr-1 T1/2 = 10.3 hrs Vd = 58.7 L   Goal of Therapy:   Vancomycin trough level 15-20 mcg/ml  Plan:  Expected duration 7 days with resolution of temperature and/or normalization of WBC   Vancomycin 1250 mg IV X 1 ordered to be given on 11/30 @ 21:00. Vancomycin 1250 mg IV Q12H ordered to start on 12/1 @ 0300, ~ 6 hrs after 1st dose (stacked dosing). This pt will reach Css on 12/2 @ 21:00. Will draw 1st trough on 12/2 @ 14:30, which will be close to Css.  Abi Shoults D 09/06/2017,8:59 PM

## 2017-09-06 NOTE — Progress Notes (Signed)
Nutrition Follow-up  DOCUMENTATION CODES:   Obesity unspecified  INTERVENTION:  When feeding tube placed, begin Vital 1.5 at 83mL/hr increase by 10 every 8 hours to goal rate of 64mL/hr Pro-stat 61mL daily  At goal, provides 2080 calories, 104gm protein, 1073mL free water  NUTRITION DIAGNOSIS:   Inadequate oral intake related to poor appetite, lethargy/confusion as evidenced by per patient/family report, meal completion < 50%. -ongoing  GOAL:   Patient will meet greater than or equal to 90% of their needs -not meeting  MONITOR:   PO intake, Supplement acceptance, Labs, Weight trends, I & O's  ASSESSMENT:   70 year old male with PMHx of benign brain tumor presents with new onset seizure after being found in postictal state in bathroom of a gas station who was emergently intubated late on 11/19 and then extubated AM of 11/20. Patient had persistent encephalopathy following extubation which required re-intubation early AM of 11/21.  Patient able to communicate some today, still difficult to understand at times. Continues to aspirate liquids and food. Dobhoff tube to be placed today.  Intake/Output Summary (Last 24 hours) at 09/06/2017 1527 Last data filed at 09/06/2017 1355 Gross per 24 hour  Intake 200 ml  Output 1075 ml  Net -875 ml   Labs reviewed:  CBGs 279, 211, 267  Medications reviewed and include:  Insulin gtt  Diet Order:  No diet orders on file  EDUCATION NEEDS:   Not appropriate for education at this time  Skin:  Skin Assessment: Reviewed RN Assessment  Last BM:  09/03/2017 - small type 4  Height:   Ht Readings from Last 1 Encounters:  08/27/17 5\' 9"  (1.753 m)    Weight:   Wt Readings from Last 1 Encounters:  09/04/17 228 lb 6.3 oz (103.6 kg)    Ideal Body Weight:  72.7 kg  BMI:  Body mass index is 33.73 kg/m.  Estimated Nutritional Needs:   Kcal:  1960-2140 (MSJ x 1.1-1.2; MSJ=1781)  Protein:  100-120 grams (1-1.2  grams/kg)  Fluid:  1.8-2.2 L/day (25-30 mL/kg IBW)  Satira Anis. Govanni Plemons, MS, RD LDN Inpatient Clinical Dietitian Pager [phone]

## 2017-09-06 NOTE — Progress Notes (Signed)
Multiple attempts at [facility] placement with assistance. Pt is very agitated during thread and shakes head vigorously during procedure -consult IR for placement

## 2017-09-06 NOTE — Progress Notes (Signed)
PT Cancellation Note  Patient Details Name: Andrew Horton MRN: [id number] DOB: January 11, 1946   Cancelled Treatment:    Reason Eval/Treat Not Completed: Pt lethargic and would only keep eyes open momentarily during conversation.  Patient declined multiple attempts from this PT and nursing for participation with PT and for attempts to sit up to EOB.  Will attempt to see pt at a future date/time as medically appropriate.     Linus Salmons PT, DPT 09/06/17, 3:39 PM

## 2017-09-06 NOTE — Progress Notes (Signed)
Subjective: Patient has had no further seizures.  At times lethargic.    Objective: Current vital signs: BP 110/60 (BP Location: Left Arm)   Pulse 88   Temp 98.3 F (36.8 C) (Axillary)   Resp (!) 24   Ht 5\' 9"  (1.753 m)   Wt 103.6 kg (228 lb 6.3 oz)   SpO2 95%   BMI 33.73 kg/m  Vital signs in last 24 hours: Temp:  [98.1 F (36.7 C)-101.4 F (38.6 C)] 98.3 F (36.8 C) (11/30 2000) Pulse Rate:  [86-121] 88 (11/30 2000) Resp:  [20-33] 24 (11/30 2000) BP: (104-173)/(52-94) 110/60 (11/30 2000) SpO2:  [90 %-98 %] 95 % (11/30 2000)  Intake/Output from previous day: 11/29 0701 - 11/30 0700 In: 178.6 [I.V.:18.6; IV Piggyback:160] Out: 300 [Urine:300] Intake/Output this shift: Total I/O In: 44.1 [I.V.:6.2; NG/GT:37.9] Out: -  Nutritional status: No diet orders on file  Neurologic Exam: Mental Status: Alert.Speech fluent but dysarthric. Able to follow 3 step commands without difficulty. Cranial Nerves: II: Discs flat bilaterally; Visual fields grossly normal, pupils equal, round, reactive to light and accommodation III,IV, VI: ptosis not present, extra-ocular motions intact bilaterally V,VII: smile symmetric, facial light touch sensation normal bilaterally VIII: hearing normal bilaterally IX,X: gag reflex present XI: bilateral shoulder shrug XII: midline tongue extension Motor: Moves all extremities against gravity with no focal weakness noted Sensory: Pinprick and light touch intact throughout, bilaterally    Lab Results: Basic Metabolic Panel: Recent Labs  Lab 08/31/17 0422 08/31/17 0500 09/02/17 0505 09/02/17 1811 09/03/17 0403 09/04/17 1629 09/06/17 0441 09/06/17 1713  NA 139  --  138  --  140 136 137  --   K 3.8  --  3.6 3.2* 3.3* 4.1 4.1  --   CL 108  --  105  --  105 103 105  --   CO2 24  --  26  --  25 23 23   --   GLUCOSE 240*  --  283*  --  225* 308* 312*  --   BUN 16  --  16  --  17 18 23*  --   CREATININE 1.03  --  0.79  --  0.86 1.00 1.07  --    CALCIUM 7.9*  --  8.2*  --  8.1* 8.6* 8.4*  --   MG  --  1.9 2.0  --  2.1  --   --  2.3  PHOS  --   --  1.5* 2.0* 3.1  --   --  2.6    Liver Function Tests: Recent Labs  Lab 09/02/17 0505 09/03/17 0403  AST 22 40  ALT 23 38  ALKPHOS 59 57  BILITOT 0.9 0.4  PROT 6.0* 5.8*  ALBUMIN 2.5* 2.4*   No results for input(s): LIPASE, AMYLASE in the last 168 hours. No results for input(s): AMMONIA in the last 168 hours.  CBC: Recent Labs  Lab 09/01/17 0046 09/02/17 0505 09/03/17 0403 09/06/17 0441  WBC 9.1 8.0 6.2 15.5*  NEUTROABS  --  5.9 3.9  --   HGB 14.1 12.7* 12.6* 14.5  HCT 41.4 36.5* 36.2* 43.5  MCV 88.9 87.6 87.3 88.6  PLT 175 168 203 306    Cardiac Enzymes: No results for input(s): CKTOTAL, CKMB, CKMBINDEX, TROPONINI in the last 168 hours.  Lipid Panel: Recent Labs  Lab 09/02/17 0505  TRIG 177*    CBG: Recent Labs  Lab 09/06/17 1514 09/06/17 1612 09/06/17 1710 09/06/17 1828 09/06/17 1934  GLUCAP 226* 215* 161* 142*  82*    Microbiology: Results for orders placed or performed during the hospital encounter of 08/26/17  MRSA PCR Screening     Status: None   Collection Time: 08/27/17  3:24 AM  Result Value Ref Range Status   MRSA by PCR NEGATIVE NEGATIVE Final    Comment:        The GeneXpert MRSA Assay (FDA approved for NASAL specimens only), is one component of a comprehensive MRSA colonization surveillance program. It is not intended to diagnose MRSA infection nor to guide or monitor treatment for MRSA infections.   Culture, respiratory (NON-Expectorated)     Status: None   Collection Time: 09/01/17 12:30 AM  Result Value Ref Range Status   Specimen Description TRACHEAL ASPIRATE  Final   Special Requests NONE  Final   Gram Stain   Final    ABUNDANT WBC PRESENT, PREDOMINANTLY PMN RARE SQUAMOUS EPITHELIAL CELLS PRESENT ABUNDANT GRAM POSITIVE RODS FEW GRAM NEGATIVE RODS    Culture   Final    Consistent with normal respiratory  flora. Performed at [facility], [place] [address], [place], [place] [address]    Report Status 09/03/2017 FINAL  Final  Urine Culture     Status: None   Collection Time: 09/01/17 12:39 AM  Result Value Ref Range Status   Specimen Description URINE, RANDOM  Final   Special Requests NONE  Final   Culture   Final    NO GROWTH Performed at [facility], [place] [address] [place], [place] [address]    Report Status 09/02/2017 FINAL  Final  Culture, blood (Routine X 2) w Reflex to ID Panel     Status: None   Collection Time: 09/01/17 12:46 AM  Result Value Ref Range Status   Specimen Description BLOOD BLOOD LEFT HAND  Final   Special Requests   Final    BOTTLES DRAWN AEROBIC AND ANAEROBIC Blood Culture adequate volume   Culture NO GROWTH 5 DAYS  Final   Report Status 09/06/2017 FINAL  Final  Culture, blood (Routine X 2) w Reflex to ID Panel     Status: None   Collection Time: 09/01/17  1:00 AM  Result Value Ref Range Status   Specimen Description BLOOD RIGHT ANTECUBITAL  Final   Special Requests   Final    BOTTLES DRAWN AEROBIC AND ANAEROBIC Blood Culture adequate volume   Culture NO GROWTH 5 DAYS  Final   Report Status 09/06/2017 FINAL  Final  CULTURE, BLOOD (ROUTINE X 2) w Reflex to ID Panel     Status: None (Preliminary result)   Collection Time: 09/04/17  6:15 PM  Result Value Ref Range Status   Specimen Description BLOOD RIGHT HAND  Final   Special Requests   Final    BOTTLES DRAWN AEROBIC AND ANAEROBIC Blood Culture adequate volume   Culture NO GROWTH 2 DAYS  Final   Report Status PENDING  Incomplete  CULTURE, BLOOD (ROUTINE X 2) w Reflex to ID Panel     Status: None (Preliminary result)   Collection Time: 09/04/17  6:22 PM  Result Value Ref Range Status   Specimen Description BLOOD LEFT HAND  Final   Special Requests   Final    BOTTLES DRAWN AEROBIC AND ANAEROBIC Blood Culture adequate volume   Culture NO GROWTH 2 DAYS  Final   Report Status PENDING  Incomplete   Urine Culture     Status: Abnormal (Preliminary result)   Collection Time: 09/05/17 12:43 AM  Result Value  Ref Range Status   Specimen Description URINE, RANDOM  Final   Special Requests NONE  Final   Culture (A)  Final    >=100,000 COLONIES/mL ENTEROCOCCUS FAECALIS SUSCEPTIBILITIES TO FOLLOW Performed at [facility], [address], [place], [place] [address]    Report Status PENDING  Incomplete  Culture, respiratory (NON-Expectorated)     Status: None (Preliminary result)   Collection Time: 09/06/17  9:00 AM  Result Value Ref Range Status   Specimen Description TRACHEAL ASPIRATE  Final   Special Requests NONE  Final   Gram Stain   Final    ABUNDANT WBC PRESENT,BOTH PMN AND MONONUCLEAR FEW GRAM VARIABLE ROD    Culture PENDING  Incomplete   Report Status PENDING  Incomplete    Coagulation Studies: No results for input(s): LABPROT, INR in the last 72 hours.  Imaging: Dg Chest Port 1 View  Result Date: 09/06/2017 CLINICAL DATA:  Hypoxia. EXAM: PORTABLE CHEST 1 VIEW COMPARISON:  09/05/2017. FINDINGS: Mediastinum and hilar structures normal. Cardiomegaly. Persistent bibasilar infiltrates. No significant change from prior exam. No pleural effusion or pneumothorax. IMPRESSION: Persistent bibasilar infiltrates.  No change from prior exam. Electronically Signed   By: Marcello Moores  Register   On: 09/06/2017 10:01   Dg Chest Port 1 View  Result Date: 09/05/2017 CLINICAL DATA:  Acute respiratory failure EXAM: PORTABLE CHEST 1 VIEW COMPARISON:  09/04/2017 FINDINGS: Hypoventilation with bibasilar atelectasis unchanged. Negative for edema or effusion. Central venous catheter tip removed IMPRESSION: Hypoventilation with bibasilar atelectasis unchanged. Electronically Signed   By: Franchot Gallo M.D.   On: 09/05/2017 07:51   Dg [facility] G Tube Plc W/fl W/rad  Result Date: 09/06/2017 CLINICAL DATA:  Feeding tube placement EXAM: NASO G TUBE PLACEMENT WITH FL AND WITH RAD CONTRAST:  None.  FLUOROSCOPY TIME:  Fluoroscopy Time:  1 minutes 54 seconds Radiation Exposure Index (if provided by the fluoroscopic device): 48.6 mGy Number of Acquired Spot Images: 3 COMPARISON:  08/28/2017. FINDINGS: Feeding tube was successfully placed using fluoroscopic guidance. Tube tip was placed to the distal stomach/duodenum. There no complications. Tube was secured to the nose with tape. IMPRESSION: Successful fluoroscopically guided feeding tube placement. Electronically Signed   By: Marcello Moores  Register   On: 09/06/2017 15:19    Medications:  I have reviewed the patient's current medications. Scheduled: . [START ON 09/07/2017] aspirin  81 mg Per Tube Daily  . atorvastatin  80 mg Per Tube q1800  . [START ON 09/07/2017] bromocriptine  12.5 mg Per Tube Daily  . chlorhexidine  15 mL Mouth Rinse BID  . enoxaparin (LOVENOX) injection  40 mg Subcutaneous Q24H  . feeding supplement (PRO-STAT SUGAR FREE 64)  30 mL Per Tube Daily  . [START ON 09/07/2017] lisinopril  20 mg Per Tube Daily  . mouth rinse  15 mL Mouth Rinse q12n4p  . mirtazapine  15 mg Per NG tube QHS  . pantoprazole (PROTONIX) IV  40 mg Intravenous Q24H    Assessment/Plan: No further seizures noted. 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   Patient remains on Depakote.  Level 43.  Recommendations: 1. Increase Depakote to 1500mg  BID 2. Depakote level in AM 3. Continue seizure precautions   LOS: 10 days   Alexis Goodell, MD Neurology [phone] 09/06/2017  8:40 PM

## 2017-09-06 NOTE — Progress Notes (Signed)
[facility] [place] NAME: Andrew Horton    MR#:  [id number]  DATE OF BIRTH:  1945-10-31  SUBJECTIVE:  CHIEF COMPLAINT:   Chief Complaint  Patient presents with  . Seizures   -Extubated, went into respiratory distress yesterday requiring BiPAP -Has thick coronary upper airway secretions requiring deep suctioning. -Still encephalopathic and slow to respond but following commands  REVIEW OF SYSTEMS:  Review of Systems  Constitutional: Positive for malaise/fatigue. Negative for chills and fever.  Respiratory: Positive for cough. Negative for shortness of breath and wheezing.   Cardiovascular: Negative for chest pain and palpitations.  Gastrointestinal: Positive for abdominal pain. Negative for constipation, diarrhea, nausea and vomiting.  Genitourinary: Negative for dysuria.  Musculoskeletal: Positive for myalgias.  Neurological: Positive for weakness. Negative for dizziness, speech change, focal weakness, seizures and headaches.  Psychiatric/Behavioral: Negative for depression.    DRUG ALLERGIES:   Allergies  Allergen Reactions  . Sulfa Antibiotics Hives    VITALS:  Blood pressure (!) 158/70, pulse 99, temperature 98.3 F (36.8 C), temperature source Oral, resp. rate (!) 29, height 5\' 9"  (1.753 m), weight 103.6 kg (228 lb 6.3 oz), SpO2 98 %.  PHYSICAL EXAMINATION:  Physical Exam  GENERAL:  71 y.o.-year-old ill appearing patient lying in the bed with no acute distress.  EYES: Pupils equal, round, reactive to light and accommodation. No scleral icterus. Extraocular muscles intact.  HEENT: Head atraumatic, normocephalic. Oropharynx and nasopharynx clear.  NECK:  Supple, no jugular venous distention. No thyroid enlargement, no tenderness.  LUNGS: Can hear take upper airway gurgling secretions, Normal breath sounds bilaterally, no wheezing, rales,rhonchi or crepitation. No use of accessory muscles of respiration. Decreased bibasilar  breath sounds CARDIOVASCULAR: S1, S2 normal. No murmurs, rubs, or gallops.  ABDOMEN: Soft, nontender, nondistended. Bowel sounds present. No organomegaly or mass.  EXTREMITIES: No pedal edema, cyanosis, or clubbing.  NEUROLOGIC: Following simple commands at this time. Weak cough PSYCHIATRIC: The patient is alert and oriented to self  SKIN: No obvious rash, lesion, or ulcer.    LABORATORY PANEL:   CBC Recent Labs  Lab 09/06/17 0441  WBC 15.5*  HGB 14.5  HCT 43.5  PLT 306   ------------------------------------------------------------------------------------------------------------------  Chemistries  Recent Labs  Lab 09/03/17 0403  09/06/17 0441  NA 140   < > 137  K 3.3*   < > 4.1  CL 105   < > 105  CO2 25   < > 23  GLUCOSE 225*   < > 312*  BUN 17   < > 23*  CREATININE 0.86   < > 1.07  CALCIUM 8.1*   < > 8.4*  MG 2.1  --   --   AST 40  --   --   ALT 38  --   --   ALKPHOS 57  --   --   BILITOT 0.4  --   --    < > = values in this interval not displayed.   ------------------------------------------------------------------------------------------------------------------  Cardiac Enzymes No results for input(s): TROPONINI in the last 168 hours. ------------------------------------------------------------------------------------------------------------------  RADIOLOGY:  Dg Chest Port 1 View  Result Date: 09/06/2017 CLINICAL DATA:  Hypoxia. EXAM: PORTABLE CHEST 1 VIEW COMPARISON:  09/05/2017. FINDINGS: Mediastinum and hilar structures normal. Cardiomegaly. Persistent bibasilar infiltrates. No significant change from prior exam. No pleural effusion or pneumothorax. IMPRESSION: Persistent bibasilar infiltrates.  No change from prior exam. Electronically Signed   By: Marcello Moores  Register   On: 09/06/2017 10:01  Dg Chest Port 1 View  Result Date: 09/05/2017 CLINICAL DATA:  Acute respiratory failure EXAM: PORTABLE CHEST 1 VIEW COMPARISON:  09/04/2017 FINDINGS:  Hypoventilation with bibasilar atelectasis unchanged. Negative for edema or effusion. Central venous catheter tip removed IMPRESSION: Hypoventilation with bibasilar atelectasis unchanged. Electronically Signed   By: Franchot Gallo M.D.   On: 09/05/2017 07:51   Dg Chest Port 1 View  Result Date: 09/04/2017 CLINICAL DATA:  Acute respiratory failure. EXAM: PORTABLE CHEST 1 VIEW COMPARISON:  08/30/2017 FINDINGS: Right internal jugular approach central venous catheter terminates in the expected location of mid superior vena cava. Cardiomediastinal silhouette is normal. Mediastinal contours appear intact. There is no evidence of pneumothorax. Bilateral lower lobe peribronchial airspace consolidation. Osseous structures are without acute abnormality. Soft tissues are grossly normal. IMPRESSION: Bilateral lower lobe peribronchial airspace consolidation may represent atelectasis or bronchitic changes. Electronically Signed   By: Fidela Salisbury M.D.   On: 09/04/2017 17:21    EKG:   Orders placed or performed during the hospital encounter of 08/26/17  . EKG 12-Lead  . EKG 12-Lead  . EKG 12-Lead  . EKG 12-Lead    ASSESSMENT AND PLAN:   71 year old male with no significant family here was brought into the hospital secondary to unresponsiveness and was noted to be in status epilepticus  #1 status epilepticus-on admission requiring intubation. -New onset seizures causing acute encephalopathy -Appreciate neurology consult. 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 -Patient was still lethargic with Keppra and now changed to Depakote. Follow-up levels -More alert and oriented to self at this time  #2 acute respiratory failure-possible aspiration. Extubated currently and requiring as needed BiPAP Have and has a weak cough and trouble clearing Secretions -might  benefit from bronch if  he has persistent pneumonia -Encouraged incentive spirometry, physical activity and continue suctioning as needed -Started on [place] today and receiving Lasix as needed - P consulted for swallow study  #3 acute renal failure-resolved at this time  #4 diabetes mellitus-with hyperglycemia, with sugars consistently greater than 250, will be started back on insulin drip -Currently on Lantus  #5 delirium-sundowning at bedtime. Improved with Seroquel at bedtime  #6 hypertension-on lisinopril  #7 DVT prophylaxis-on Lovenox  Physical therapy recommended rehabilitation    All the records are reviewed and case discussed with Care Management/Social Workerr. Management plans discussed with the patient, family and they are in agreement.  CODE STATUS: Full Code  TOTAL TIME TAKING CARE OF THIS PATIENT: 37 minutes.   POSSIBLE D/C IN 3-4 DAYS, DEPENDING ON CLINICAL CONDITION.   Park Beck M.D on 09/06/2017 at 11:43 AM  Between 7am to 6pm - Pager - [phone]  After 6pm go to [email or web address] - password [email or web address] [facility]  Office  [phone]  CC: Primary care physician; System, Pcp Not In

## 2017-09-06 NOTE — Progress Notes (Signed)
Last night at approximately 2130 there was an attempt to perform nasopharyngeal suctioning on patient due to copious secretions in patient's throat.  Attempt was not successful as patient began swiping and swinging at NT tube and nurses. NP was contacted and put in order for soft wrist restraints when performing procedures. Wrist restraints were applied and nasopharyngeal suctioning was successfully performed. Restraints were then removed. Restraint orders are being kept active incase suctioning needs to be performed again but will be kept off otherwise.

## 2017-09-06 NOTE — Plan of Care (Signed)
Tele sitter initiated overnight.  Pt with copious secretion requiring NT suctioning.  Pt not tolerating PO due to neurological status.  Limited communication - mostly with nodding of the head.

## 2017-09-06 NOTE — Care Management Note (Signed)
Case Management Note  Patient Details  Name: Andrew Horton MRN: [id number] Date of Birth: 1946/07/12  Subjective/Objective:                 During rounds discussed whether patient had capacity.  Psychiatry is following and will be asked to address capacity.  At present his friend Marchelle Gearing who has known patient for 9 years is making decisions. Discussed case with CSW regarding lack of family and Richardson Landry is the only person that knows patient.  Per intensivist, patient is appropriate for ltac.  Attempted to discuss with patient and he is lethargic and occasionally shakes his head- then nods off.  Spoke with [facility] and [facility]. He ask if there is a facility close to [facility]. Intensivist is evaluating patient's intracranial mass before making decision to send to ltac.  Neurology has been following patient and is aware of this finding. If not intervention, patient may be stable for transfer 12/3   Action/Plan:  [facility] are following and has discussed bed availability.  Expected Discharge Date:                  Expected Discharge Plan:     In-House Referral:     Discharge planning Services     Post Acute Care Choice:    Choice offered to:     DME Arranged:    DME Agency:     HH Arranged:    HH Agency:     Status of Service:     If discussed at [employer], dates discussed:    Additional Comments:  Katrina Stack, RN 09/06/2017, 12:53 PM

## 2017-09-06 NOTE — Progress Notes (Addendum)
Inpatient Diabetes Program Recommendations  AACE/ADA: New Consensus Statement on Inpatient Glycemic Control (2015)  Target Ranges:  Prepandial:   less than 140 mg/dL      Peak postprandial:   less than 180 mg/dL (1-2 hours)      Critically ill patients:  140 - 180 mg/dL   Lab Results  Component Value Date   GLUCAP 267 (H) 09/06/2017   HGBA1C 8.4 (H) 08/26/2017    Review of Glycemic Control  Results for Andrew Horton, Andrew Horton (MRN [id number]) as of 09/06/2017 09:51  Ref. Range 09/05/2017 10:03 09/05/2017 11:33 09/05/2017 16:11 09/05/2017 22:41 09/06/2017 07:17  Glucose-Capillary Latest Ref Range: 65 - 99 mg/dL 194 (H) 194 (H) 285 (H) 324 (H) 267 (H)    Home DM Meds:Tradjenta 5 mg daily Metformin 1000 mg BID  Current Insulin Orders:IV insulin Phase 3 ICU Glycemic control.  Patient transitioned to Phase 3 ICU Glycemic control order set but parameters were not met when the transition occurred.  Initiate phase 3 when insulin rate is <4units/hour, and 6 CBG are <167m/dl, then give Lantus 2 hours before the drip is d/c'd (dose is based on last drip rate), Novolog is based on GFR.  Since CBG have been greater than 250 on 3 occasions, patient should be transitioned back to Phase 2, IV insulin per protocol.   JGentry Fitz RN, BA, MHA, CDE Diabetes Coordinator Inpatient Diabetes Program  [phone](Team Pager) [phone]([place] 09/06/2017 9:58 AM

## 2017-09-07 ENCOUNTER — Inpatient Hospital Stay: Payer: Medicare Other

## 2017-09-07 DIAGNOSIS — R569 Unspecified convulsions: Secondary | ICD-10-CM

## 2017-09-07 LAB — GLUCOSE, CAPILLARY
Glucose-Capillary: 119 mg/dL — ABNORMAL HIGH (ref 65–99)
Glucose-Capillary: 129 mg/dL — ABNORMAL HIGH (ref 65–99)
Glucose-Capillary: 134 mg/dL — ABNORMAL HIGH (ref 65–99)
Glucose-Capillary: 146 mg/dL — ABNORMAL HIGH (ref 65–99)
Glucose-Capillary: 152 mg/dL — ABNORMAL HIGH (ref 65–99)
Glucose-Capillary: 154 mg/dL — ABNORMAL HIGH (ref 65–99)
Glucose-Capillary: 161 mg/dL — ABNORMAL HIGH (ref 65–99)
Glucose-Capillary: 162 mg/dL — ABNORMAL HIGH (ref 65–99)
Glucose-Capillary: 164 mg/dL — ABNORMAL HIGH (ref 65–99)
Glucose-Capillary: 165 mg/dL — ABNORMAL HIGH (ref 65–99)
Glucose-Capillary: 166 mg/dL — ABNORMAL HIGH (ref 65–99)
Glucose-Capillary: 167 mg/dL — ABNORMAL HIGH (ref 65–99)

## 2017-09-07 LAB — URINE CULTURE

## 2017-09-07 LAB — MAGNESIUM
MAGNESIUM: 2.2 mg/dL (ref 1.7–2.4)
MAGNESIUM: 2.4 mg/dL (ref 1.7–2.4)

## 2017-09-07 LAB — VALPROIC ACID LEVEL: Valproic Acid Lvl: 62 ug/mL (ref 50.0–100.0)

## 2017-09-07 LAB — PHOSPHORUS
PHOSPHORUS: 2.8 mg/dL (ref 2.5–4.6)
PHOSPHORUS: 3.4 mg/dL (ref 2.5–4.6)

## 2017-09-07 MED ORDER — SODIUM CHLORIDE 0.9 % IV SOLN
3.0000 g | Freq: Four times a day (QID) | INTRAVENOUS | Status: DC
  Administered 2017-09-07 – 2017-09-09 (×9): 3 g via INTRAVENOUS
  Filled 2017-09-07 (×13): qty 3

## 2017-09-07 MED ORDER — INSULIN ASPART 100 UNIT/ML ~~LOC~~ SOLN
0.0000 [IU] | SUBCUTANEOUS | Status: DC
  Administered 2017-09-07 (×2): 4 [IU] via SUBCUTANEOUS
  Administered 2017-09-07: 5 [IU] via SUBCUTANEOUS
  Administered 2017-09-07: 1 [IU] via SUBCUTANEOUS
  Administered 2017-09-08 (×2): 3 [IU] via SUBCUTANEOUS
  Filled 2017-09-07 (×6): qty 1

## 2017-09-07 MED ORDER — SODIUM CHLORIDE 0.9 % IV SOLN
1.0000 g | Freq: Four times a day (QID) | INTRAVENOUS | Status: DC
  Filled 2017-09-07 (×3): qty 1000

## 2017-09-07 NOTE — Progress Notes (Signed)
[facility] [place] NAME: Andrew Horton    MR#:  [id number]  DATE OF BIRTH:  1946-03-17  SUBJECTIVE:  CHIEF COMPLAINT:   Chief Complaint  Patient presents with  . Seizures   -Improved oral secretions today. Failed his swallow study yesterday and started on Dobbhoff feeds. However pulled out his Dobbhoff tube last night. -Remains on insulin drip for elevated sugars  REVIEW OF SYSTEMS:  Review of Systems  Constitutional: Positive for malaise/fatigue. Negative for chills and fever.  Respiratory: Positive for cough. Negative for shortness of breath and wheezing.   Cardiovascular: Negative for chest pain and palpitations.  Gastrointestinal: Positive for abdominal pain. Negative for constipation, diarrhea, nausea and vomiting.  Genitourinary: Negative for dysuria.  Musculoskeletal: Positive for myalgias.  Neurological: Positive for weakness. Negative for dizziness, speech change, focal weakness, seizures and headaches.  Psychiatric/Behavioral: Negative for depression.    DRUG ALLERGIES:   Allergies  Allergen Reactions  . Sulfa Antibiotics Hives    VITALS:  Blood pressure (!) 130/51, pulse 90, temperature 98.4 F (36.9 C), temperature source Oral, resp. rate (!) 29, height 5\' 9"  (1.753 m), weight 97.5 kg (214 lb 15.2 oz), SpO2 94 %.  PHYSICAL EXAMINATION:  Physical Exam  GENERAL:  71 y.o.-year-old ill appearing patient lying in the bed with no acute distress.  EYES: Pupils equal, round, reactive to light and accommodation. No scleral icterus. Extraocular muscles intact.  HEENT: Head atraumatic, normocephalic. Oropharynx and nasopharynx clear.  NECK:  Supple, no jugular venous distention. No thyroid enlargement, no tenderness.  LUNGS: Normal breath sounds bilaterally, no wheezing, rales or crepitation. Occasional rhonchi heard. No use of accessory muscles of respiration. Decreased bibasilar breath sounds CARDIOVASCULAR: S1, S2  normal. No murmurs, rubs, or gallops.  ABDOMEN: Soft, nontender, nondistended. Bowel sounds present. No organomegaly or mass.  EXTREMITIES: No pedal edema, cyanosis, or clubbing.  NEUROLOGIC: Following simple commands at this time. Weak cough PSYCHIATRIC: The patient is alert and oriented to self  SKIN: No obvious rash, lesion, or ulcer.    LABORATORY PANEL:   CBC Recent Labs  Lab 09/06/17 0441  WBC 15.5*  HGB 14.5  HCT 43.5  PLT 306   ------------------------------------------------------------------------------------------------------------------  Chemistries  Recent Labs  Lab 09/03/17 0403  09/06/17 0441  09/07/17 0509  NA 140   < > 137  --   --   K 3.3*   < > 4.1  --   --   CL 105   < > 105  --   --   CO2 25   < > 23  --   --   GLUCOSE 225*   < > 312*  --   --   BUN 17   < > 23*  --   --   CREATININE 0.86   < > 1.07  --   --   CALCIUM 8.1*   < > 8.4*  --   --   MG 2.1  --   --    < > 2.2  AST 40  --   --   --   --   ALT 38  --   --   --   --   ALKPHOS 57  --   --   --   --   BILITOT 0.4  --   --   --   --    < > = values in this interval not displayed.   ------------------------------------------------------------------------------------------------------------------  Cardiac Enzymes No  results for input(s): TROPONINI in the last 168 hours. ------------------------------------------------------------------------------------------------------------------  RADIOLOGY:  Dg Chest Port 1 View  Result Date: 09/06/2017 CLINICAL DATA:  Hypoxia. EXAM: PORTABLE CHEST 1 VIEW COMPARISON:  09/05/2017. FINDINGS: Mediastinum and hilar structures normal. Cardiomegaly. Persistent bibasilar infiltrates. No significant change from prior exam. No pleural effusion or pneumothorax. IMPRESSION: Persistent bibasilar infiltrates.  No change from prior exam. Electronically Signed   By: Marcello Moores  Register   On: 09/06/2017 10:01   Dg Loyce Dys Tube Plc W/fl W/rad  Result Date:  09/06/2017 CLINICAL DATA:  Feeding tube placement EXAM: NASO G TUBE PLACEMENT WITH FL AND WITH RAD CONTRAST:  None. FLUOROSCOPY TIME:  Fluoroscopy Time:  1 minutes 54 seconds Radiation Exposure Index (if provided by the fluoroscopic device): 48.6 mGy Number of Acquired Spot Images: 3 COMPARISON:  08/28/2017. FINDINGS: Feeding tube was successfully placed using fluoroscopic guidance. Tube tip was placed to the distal stomach/duodenum. There no complications. Tube was secured to the nose with tape. IMPRESSION: Successful fluoroscopically guided feeding tube placement. Electronically Signed   By: Marcello Moores  Register   On: 09/06/2017 15:19    EKG:   Orders placed or performed during the hospital encounter of 08/26/17  . EKG 12-Lead  . EKG 12-Lead  . EKG 12-Lead  . EKG 12-Lead    ASSESSMENT AND PLAN:   71 year old male with no significant family here was brought into the hospital secondary to unresponsiveness and was noted to be in status epilepticus  #1 status epilepticus-on admission requiring intubation. -New onset seizures causing acute encephalopathy -Appreciate neurology consult. 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 -Patient was still lethargic with Keppra and now changed to Depakote. Follow-up levels-dose being adjusted by neurology. -More alert and oriented to self at this time  #2 acute respiratory failure-possible aspiration. Extubated currently and requiring as needed BiPAP Have and has a weak cough and trouble clearing Secretions -might  benefit from bronch if he has persistent pneumonia -Encouraged incentive spirometry, physical activity and continue suctioning as needed -on Fortaz and receiving Lasix as needed -Failed swallow study. Might need to be started on tube feeds. Pulled out his Dobbhoff tube last night.  #3 apathy and depression-relating to his  current situation. Appreciate psych consult. Started on Remeron. Will need to have the Dobbhoff tube/NG tube put back in  #4 diabetes mellitus-with hyperglycemia, with sugars consistently greater than 250,  - started back on insulin drip -Appreciate diabetes coordinator's input  #5 delirium-sundowning at bedtime. 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 -Will need aggressive physical therapy  Physical therapy recommended rehabilitation    All the records are reviewed and case discussed with Care Management/Social Workerr. Management plans discussed with the patient, family and they are in agreement.  CODE STATUS: Full Code  TOTAL TIME TAKING CARE OF THIS PATIENT: 41 minutes.   POSSIBLE D/C IN 3-4 DAYS, DEPENDING ON CLINICAL CONDITION.   Gladstone Lighter M.D on 09/07/2017 at 8:44 AM  Between 7am to 6pm - Pager - [phone]  After 6pm go to [email or web address] - password [email or web address] [facility]  Office  [phone]  CC: Primary care physician; System, Pcp Not In

## 2017-09-07 NOTE — Progress Notes (Signed)
Andrew Horton Psychiatry Consult   Reason for Consult: Follow-up consult 71 year old man with recent seizures and respiratory problems in the intensive care unit. Referring Physician:  Tressia Miners  09/07/2017 5:15 PM Andrew Horton  MRN:  [id number] Subjective:  Andrew Horton is "grumpy" per nursing today.  He pulled out his NG tube.  In the room, he is difficult to understand.  I talked to him about aspiration showing on his barium swallow and why the NG tube was placed.  Also reviewed psych meds.  He denies oversedation.  His mood is poor.  Principal Problem: Adjustment disorder with mixed anxiety and depressed mood Diagnosis:   Patient Active Problem List   Diagnosis Date Noted  . Seizure ([place]) [R56.9]   . Adjustment disorder with mixed anxiety and depressed mood [F43.23] 09/04/2017  . Dysthymia [F34.1] 09/04/2017  . Acute respiratory failure ([place]) [J96.00]   . Status epilepticus ([place]) [G40.901] 08/27/2017   Total Time spent with patient: 20 minutes  Past Medical History:  Past Medical History:  Diagnosis Date  . Brain tumor (benign) ([place])     Family History: History reviewed. No pertinent family history.  Social History:  Social History   Substance and Sexual Activity  Alcohol Use No  . Frequency: Never     Social History   Substance and Sexual Activity  Drug Use No    Social History   Socioeconomic History  . Marital status: Single    Spouse name: None  . Number of children: None  . Years of education: None  . Highest education level: None  Social Needs  . Financial resource strain: None  . Food insecurity - worry: None  . Food insecurity - inability: None  . Transportation needs - medical: None  . Transportation needs - non-medical: None  Occupational History  . None  Tobacco Use  . Smoking status: Never Smoker  . Smokeless tobacco: Never Used  Substance and Sexual Activity  . Alcohol use: No    Frequency: Never  . Drug use: No  . Sexual  activity: None  Other Topics Concern  . None  Social History Narrative  . None   Additional Social History:                          Current Medications: Current Facility-Administered Medications  Medication Dose Route Frequency Provider Last Rate Last Dose  . acetaminophen (TYLENOL) tablet 650 mg  650 mg Oral Q6H PRN Harrie Foreman, MD       Or  . acetaminophen (TYLENOL) suppository 650 mg  650 mg Rectal Q6H PRN Harrie Foreman, MD   650 mg at 09/05/17 2302  . Ampicillin-Sulbactam (UNASYN) 3 g in sodium chloride 0.9 % 100 mL IVPB  3 g Intravenous Q6H Nettie Elm, MD   Stopped at 09/07/17 1245  . aspirin chewable tablet 81 mg  81 mg Per Tube Daily Nettie Elm, MD      . atorvastatin (LIPITOR) tablet 80 mg  80 mg Per Tube q1800 Nettie Elm, MD   80 mg at 09/06/17 1731  . bromocriptine (PARLODEL) tablet 12.5 mg  12.5 mg Per Tube Daily Nettie Elm, MD      . chlorhexidine (PERIDEX) 0.12 % solution 15 mL  15 mL Mouth Rinse BID Fritzi Mandes, MD   15 mL at 09/07/17 1208  . enoxaparin (LOVENOX) injection 40 mg  40 mg Subcutaneous Q24H Fritzi Mandes, MD   40 mg at 09/07/17 1208  .  feeding supplement (PRO-STAT SUGAR FREE 64) liquid 30 mL  30 mL Per Tube Daily Nettie Elm, MD   30 mL at 09/06/17 1731  . feeding supplement (VITAL 1.5 CAL) liquid 1,000 mL  1,000 mL Per Tube Continuous Nettie Elm, MD   Stopped at 09/07/17 0118  . hydrALAZINE (APRESOLINE) injection 10 mg  10 mg Intravenous Q2H PRN Flora Lipps, MD   10 mg at 09/04/17 0055  . insulin aspart (novoLOG) injection 0-20 Units  0-20 Units Subcutaneous Q4H Nettie Elm, MD   4 Units at 09/07/17 1207  . lisinopril (PRINIVIL,ZESTRIL) tablet 20 mg  20 mg Per Tube Daily Nettie Elm, MD      . LORazepam (ATIVAN) injection 2 mg  2 mg Intravenous PRN Wilhelmina Mcardle, MD   2 mg at 09/04/17 0228  . MEDLINE mouth rinse  15 mL Mouth Rinse q12n4p Fritzi Mandes, MD   15 mL at 09/07/17 1208  . metoprolol tartrate (LOPRESSOR) injection  5 mg  5 mg Intravenous Q6H PRN Awilda Bill, NP      . mirtazapine (REMERON SOL-TAB) disintegrating tablet 15 mg  15 mg Per NG tube QHS Clapacs, Madie Reno, MD   15 mg at 09/06/17 2250  . ondansetron (ZOFRAN) injection 4 mg  4 mg Intravenous Q6H PRN Harrie Foreman, MD      . pantoprazole (PROTONIX) injection 40 mg  40 mg Intravenous Q24H Nettie Elm, MD   40 mg at 09/07/17 1207  . valproate (DEPACON) 1,500 mg in dextrose 5 % 50 mL IVPB  1,500 mg Intravenous Q12H Alexis Goodell, MD   Stopped at 09/07/17 [phone]    Lab Results:  Results for orders placed or performed during the hospital encounter of 08/26/17 (from the past 48 hour(s))  Glucose, capillary     Status: Abnormal   Collection Time: 09/05/17 10:41 PM  Result Value Ref Range   Glucose-Capillary 324 (H) 65 - 99 mg/dL  Valproic acid level     Status: Abnormal   Collection Time: 09/06/17  4:41 AM  Result Value Ref Range   Valproic Acid Lvl 43 (L) 50.0 - 100.0 ug/mL  Basic metabolic panel     Status: Abnormal   Collection Time: 09/06/17  4:41 AM  Result Value Ref Range   Sodium 137 135 - 145 mmol/L   Potassium 4.1 3.5 - 5.1 mmol/L   Chloride 105 101 - 111 mmol/L   CO2 23 22 - 32 mmol/L   Glucose, Bld 312 (H) 65 - 99 mg/dL   BUN 23 (H) 6 - 20 mg/dL   Creatinine, Ser 1.07 0.61 - 1.24 mg/dL   Calcium 8.4 (L) 8.9 - 10.3 mg/dL   GFR calc non Af Amer >60 >60 mL/min   GFR calc Af Amer >60 >60 mL/min    Comment: (NOTE) The eGFR has been calculated using the CKD EPI equation. This calculation has not been validated in all clinical situations. eGFR's persistently <60 mL/min signify possible Chronic Kidney Disease.    Anion gap 9 5 - 15  CBC     Status: Abnormal   Collection Time: 09/06/17  4:41 AM  Result Value Ref Range   WBC 15.5 (H) 3.8 - 10.6 K/uL   RBC 4.92 4.40 - 5.90 MIL/uL   Hemoglobin 14.5 13.0 - 18.0 g/dL   HCT 43.5 40.0 - 52.0 %   MCV 88.6 80.0 - 100.0 fL   MCH 29.6 26.0 - 34.0 pg   MCHC 33.4 32.0 - 36.0 g/dL  RDW 13.8 11.5 - 14.5 %   Platelets 306 150 - 440 K/uL  Glucose, capillary     Status: Abnormal   Collection Time: 09/06/17  7:17 AM  Result Value Ref Range   Glucose-Capillary 267 (H) 65 - 99 mg/dL  Culture, respiratory (NON-Expectorated)     Status: None (Preliminary result)   Collection Time: 09/06/17  9:00 AM  Result Value Ref Range   Specimen Description TRACHEAL ASPIRATE    Special Requests NONE    Gram Stain      ABUNDANT WBC PRESENT,BOTH PMN AND MONONUCLEAR FEW GRAM VARIABLE ROD    Culture      CULTURE REINCUBATED FOR BETTER GROWTH Performed at [facility], [place] [address], [place], [place] [address]    Report Status PENDING   Glucose, capillary     Status: Abnormal   Collection Time: 09/06/17 11:10 AM  Result Value Ref Range   Glucose-Capillary 211 (H) 65 - 99 mg/dL  Glucose, capillary     Status: Abnormal   Collection Time: 09/06/17 12:59 PM  Result Value Ref Range   Glucose-Capillary 279 (H) 65 - 99 mg/dL  Glucose, capillary     Status: Abnormal   Collection Time: 09/06/17  1:54 PM  Result Value Ref Range   Glucose-Capillary 248 (H) 65 - 99 mg/dL  Glucose, capillary     Status: Abnormal   Collection Time: 09/06/17  3:14 PM  Result Value Ref Range   Glucose-Capillary 226 (H) 65 - 99 mg/dL  Glucose, capillary     Status: Abnormal   Collection Time: 09/06/17  4:12 PM  Result Value Ref Range   Glucose-Capillary 215 (H) 65 - 99 mg/dL  Glucose, capillary     Status: Abnormal   Collection Time: 09/06/17  5:10 PM  Result Value Ref Range   Glucose-Capillary 161 (H) 65 - 99 mg/dL  Magnesium     Status: None   Collection Time: 09/06/17  5:13 PM  Result Value Ref Range   Magnesium 2.3 1.7 - 2.4 mg/dL  Phosphorus     Status: None   Collection Time: 09/06/17  5:13 PM  Result Value Ref Range   Phosphorus 2.6 2.5 - 4.6 mg/dL  Glucose, capillary     Status: Abnormal   Collection Time: 09/06/17  6:28 PM  Result Value Ref Range   Glucose-Capillary 142 (H) 65 - 99  mg/dL  Glucose, capillary     Status: Abnormal   Collection Time: 09/06/17  7:34 PM  Result Value Ref Range   Glucose-Capillary 121 (H) 65 - 99 mg/dL   Comment 1 Notify RN    Comment 2 Document in Chart   Glucose, capillary     Status: Abnormal   Collection Time: 09/06/17  8:39 PM  Result Value Ref Range   Glucose-Capillary 125 (H) 65 - 99 mg/dL   Comment 1 Notify RN    Comment 2 Document in Chart   Glucose, capillary     Status: Abnormal   Collection Time: 09/06/17  9:54 PM  Result Value Ref Range   Glucose-Capillary 130 (H) 65 - 99 mg/dL   Comment 1 Notify RN    Comment 2 Document in Chart   Glucose, capillary     Status: Abnormal   Collection Time: 09/06/17 10:57 PM  Result Value Ref Range   Glucose-Capillary 184 (H) 65 - 99 mg/dL   Comment 1 Notify RN   Glucose, capillary     Status: Abnormal   Collection Time: 09/07/17 12:01  AM  Result Value Ref Range   Glucose-Capillary 161 (H) 65 - 99 mg/dL  Glucose, capillary     Status: Abnormal   Collection Time: 09/07/17  1:05 AM  Result Value Ref Range   Glucose-Capillary 154 (H) 65 - 99 mg/dL   Comment 1 Notify RN   Glucose, capillary     Status: Abnormal   Collection Time: 09/07/17  2:10 AM  Result Value Ref Range   Glucose-Capillary 167 (H) 65 - 99 mg/dL   Comment 1 Notify RN    Comment 2 Document in Chart   Glucose, capillary     Status: Abnormal   Collection Time: 09/07/17  3:14 AM  Result Value Ref Range   Glucose-Capillary 162 (H) 65 - 99 mg/dL   Comment 1 Notify RN    Comment 2 Document in Chart   Glucose, capillary     Status: Abnormal   Collection Time: 09/07/17  4:22 AM  Result Value Ref Range   Glucose-Capillary 134 (H) 65 - 99 mg/dL  Magnesium     Status: None   Collection Time: 09/07/17  5:09 AM  Result Value Ref Range   Magnesium 2.2 1.7 - 2.4 mg/dL  Phosphorus     Status: None   Collection Time: 09/07/17  5:09 AM  Result Value Ref Range   Phosphorus 2.8 2.5 - 4.6 mg/dL  Valproic acid level      Status: None   Collection Time: 09/07/17  5:09 AM  Result Value Ref Range   Valproic Acid Lvl 62 50.0 - 100.0 ug/mL  Glucose, capillary     Status: Abnormal   Collection Time: 09/07/17  5:28 AM  Result Value Ref Range   Glucose-Capillary 119 (H) 65 - 99 mg/dL   Comment 1 Notify RN    Comment 2 Document in Chart   Glucose, capillary     Status: Abnormal   Collection Time: 09/07/17  6:59 AM  Result Value Ref Range   Glucose-Capillary 129 (H) 65 - 99 mg/dL  Glucose, capillary     Status: Abnormal   Collection Time: 09/07/17  8:02 AM  Result Value Ref Range   Glucose-Capillary 165 (H) 65 - 99 mg/dL  Glucose, capillary     Status: Abnormal   Collection Time: 09/07/17 11:58 AM  Result Value Ref Range   Glucose-Capillary 164 (H) 65 - 99 mg/dL  Glucose, capillary     Status: Abnormal   Collection Time: 09/07/17  4:47 PM  Result Value Ref Range   Glucose-Capillary 166 (H) 65 - 99 mg/dL    Blood Alcohol level:  No results found for: [facility]  Metabolic Disorder Labs: Lab Results  Component Value Date   HGBA1C 8.4 (H) 08/26/2017   MPG 194.38 08/26/2017   Lab Results  Component Value Date   PROLACTIN 112.8 (H) 08/27/2017   Lab Results  Component Value Date   TRIG 177 (H) 09/02/2017     Psychiatric Specialty Exam: Physical Exam  Respiratory: He is in respiratory distress.  Psychiatric: His affect is blunt. His speech is slurred. He is slowed. He is noncommunicative.    Review of Systems  Psychiatric/Behavioral: Positive for depression and memory loss.    Blood pressure (!) 171/79, pulse 91, temperature 98.7 F (37.1 C), temperature source Oral, resp. rate (!) 26, height _0  (1.753 m), weight 97.5 kg (214 lb 15.2 oz), SpO2 94 %.Body mass index is 31.74 kg/m.  General Appearance: Disheveled  Eye Contact:  Fair  Speech:  Garbled  Volume:  Decreased  Mood:  Dysphoric  Affect:  Congruent  Thought Process:  NA  Orientation:  NA  Thought Content:  NA  Suicidal Thoughts:   No  Homicidal Thoughts:  No  Memory:  Immediate;   Poor Recent;   Poor Remote;   Poor  Judgement:  Poor  Insight:  Lacking  Psychomotor Activity:  Decreased  Concentration:  Concentration: Fair and Attention Span: Fair  Recall:  [employer] of Knowledge:  Poor  Language:  NA  Akathisia:  No  Handed:  Right  AIMS (if indicated):     Assets:  Resilience  ADL's:  Impaired  Cognition:  Impaired,  Mild  Sleep:       Treatment Plan Summary: Daily contact with patient to assess and evaluate symptoms and progress in treatment, Medication management and Plan Patient seems to be gradually improving.  He is able to communicate a little bit better but his speech is still so difficult to understand it is hard for me to know just how clearly he is thinking.  His affect is dysphoric and he seems to indicate some apathy about his situation.   Depression -continue mirtazapine 43m soltabs -psych will continue to follow appreciate consult   Disposition: Patient does not meet criteria for psychiatric inpatient admission. Supportive therapy provided about ongoing stressors.    LJolene Schimke MD 09/07/2017, 5:15 PM

## 2017-09-07 NOTE — Progress Notes (Signed)
ANTIBIOTIC CONSULT NOTE - INITIAL  Pharmacy Consult for Unasyn Indication: PNA (question of aspiration) and enterococcal UTI  Allergies  Allergen Reactions  . Sulfa Antibiotics Hives    Patient Measurements: Height: 5\' 9"  (175.3 cm) Weight: 214 lb 15.2 oz (97.5 kg) IBW/kg (Calculated) : 70.7 Adjusted Body Weight:   Vital Signs: Temp: 98.4 F (36.9 C) (12/01 0800) Temp Source: Oral (12/01 0800) BP: 155/60 (12/01 1000) Pulse Rate: 94 (12/01 1000) Intake/Output from previous day: 11/30 0701 - 12/01 0700 In: 816.5 [I.V.:65.3; NG/GT:186.3; IV Piggyback:565] Out: 1150 [Urine:1150] Intake/Output from this shift: Total I/O In: 1.6 [I.V.:1.6] Out: -   Labs: Recent Labs    09/04/17 1629 09/06/17 0441  WBC  --  15.5*  HGB  --  14.5  PLT  --  306  CREATININE 1.00 1.07   Estimated Creatinine Clearance: 72.9 mL/min (by C-G formula based on SCr of 1.07 mg/dL). No results for input(s): VANCOTROUGH, VANCOPEAK, VANCORANDOM, GENTTROUGH, GENTPEAK, GENTRANDOM, TOBRATROUGH, TOBRAPEAK, TOBRARND, AMIKACINPEAK, AMIKACINTROU, AMIKACIN in the last 72 hours.   Microbiology: Recent Results (from the past 720 hour(s))  MRSA PCR Screening     Status: None   Collection Time: 08/27/17  3:24 AM  Result Value Ref Range Status   MRSA by PCR NEGATIVE NEGATIVE Final    Comment:        The GeneXpert MRSA Assay (FDA approved for NASAL specimens only), is one component of a comprehensive MRSA colonization surveillance program. It is not intended to diagnose MRSA infection nor to guide or monitor treatment for MRSA infections.   Culture, respiratory (NON-Expectorated)     Status: None   Collection Time: 09/01/17 12:30 AM  Result Value Ref Range Status   Specimen Description TRACHEAL ASPIRATE  Final   Special Requests NONE  Final   Gram Stain   Final    ABUNDANT WBC PRESENT, PREDOMINANTLY PMN RARE SQUAMOUS EPITHELIAL CELLS PRESENT ABUNDANT GRAM POSITIVE RODS FEW GRAM NEGATIVE RODS     Culture   Final    Consistent with normal respiratory flora. Performed at [facility], [place] [address] [place], [place] [address]    Report Status 09/03/2017 FINAL  Final  Urine Culture     Status: None   Collection Time: 09/01/17 12:39 AM  Result Value Ref Range Status   Specimen Description URINE, RANDOM  Final   Special Requests NONE  Final   Culture   Final    NO GROWTH Performed at [facility], [place] [address], [place], [place] [address]    Report Status 09/02/2017 FINAL  Final  Culture, blood (Routine X 2) w Reflex to ID Panel     Status: None   Collection Time: 09/01/17 12:46 AM  Result Value Ref Range Status   Specimen Description BLOOD BLOOD LEFT HAND  Final   Special Requests   Final    BOTTLES DRAWN AEROBIC AND ANAEROBIC Blood Culture adequate volume   Culture NO GROWTH 5 DAYS  Final   Report Status 09/06/2017 FINAL  Final  Culture, blood (Routine X 2) w Reflex to ID Panel     Status: None   Collection Time: 09/01/17  1:00 AM  Result Value Ref Range Status   Specimen Description BLOOD RIGHT ANTECUBITAL  Final   Special Requests   Final    BOTTLES DRAWN AEROBIC AND ANAEROBIC Blood Culture adequate volume   Culture NO GROWTH 5 DAYS  Final   Report Status 09/06/2017 FINAL  Final  CULTURE, BLOOD (ROUTINE X 2)  w Reflex to ID Panel     Status: None (Preliminary result)   Collection Time: 09/04/17  6:15 PM  Result Value Ref Range Status   Specimen Description BLOOD RIGHT HAND  Final   Special Requests   Final    BOTTLES DRAWN AEROBIC AND ANAEROBIC Blood Culture adequate volume   Culture NO GROWTH 3 DAYS  Final   Report Status PENDING  Incomplete  CULTURE, BLOOD (ROUTINE X 2) w Reflex to ID Panel     Status: None (Preliminary result)   Collection Time: 09/04/17  6:22 PM  Result Value Ref Range Status   Specimen Description BLOOD LEFT HAND  Final   Special Requests   Final    BOTTLES DRAWN AEROBIC AND ANAEROBIC Blood Culture adequate volume   Culture NO  GROWTH 3 DAYS  Final   Report Status PENDING  Incomplete  Urine Culture     Status: Abnormal   Collection Time: 09/05/17 12:43 AM  Result Value Ref Range Status   Specimen Description URINE, RANDOM  Final   Special Requests NONE  Final   Culture >=100,000 COLONIES/mL ENTEROCOCCUS FAECALIS (A)  Final   Report Status 09/07/2017 FINAL  Final   Organism ID, Bacteria ENTEROCOCCUS FAECALIS (A)  Final      Susceptibility   Enterococcus faecalis - MIC*    AMPICILLIN <=2 SENSITIVE Sensitive     LEVOFLOXACIN 1 SENSITIVE Sensitive     NITROFURANTOIN <=16 SENSITIVE Sensitive     VANCOMYCIN 1 SENSITIVE Sensitive     * >=100,000 COLONIES/mL ENTEROCOCCUS FAECALIS  Culture, respiratory (NON-Expectorated)     Status: None (Preliminary result)   Collection Time: 09/06/17  9:00 AM  Result Value Ref Range Status   Specimen Description TRACHEAL ASPIRATE  Final   Special Requests NONE  Final   Gram Stain   Final    ABUNDANT WBC PRESENT,BOTH PMN AND MONONUCLEAR FEW GRAM VARIABLE ROD    Culture   Final    CULTURE REINCUBATED FOR BETTER GROWTH Performed at [facility], [address], [place], [place] [address]    Report Status PENDING  Incomplete    Medical History: Past Medical History:  Diagnosis Date  . Brain tumor (benign) ([place])     Medications:  Infusions:  . ampicillin-sulbactam (UNASYN) IV    . feeding supplement (VITAL 1.5 CAL) Stopped (09/07/17 0118)  . valproate sodium Stopped (09/07/17 0437)   Assessment: 71 yom cc seizures with recent history of acute respiratory failure and ventilation, now with improved secretions. No pseudomonas grew on respiratory culture. Concern for aspiration. Now urine culture growing enterococcus faecalis sensitive to ampicillin. Pharmacy consulted to dose Unasyn to cover both aspiration PNA and enterococcal UTI.  Goal of Therapy:  Resolve infection Prevent ADE  Plan:  Unasyn 3 gm IV Q6H  Laural Benes, Pharm.D., BCPS Clinical  Pharmacist 09/07/2017,10:24 AM

## 2017-09-07 NOTE — Progress Notes (Signed)
Patient removed Dobbhoff. NP notified. Will attempt to replace Dobbhoff with assistance. Verbal orders from Np that if unsuccessful to place NG tube and get Chest Xray.

## 2017-09-07 NOTE — Progress Notes (Signed)
Subjective: Patient awake and alert today.  No further seizures noted.  Wants to eat.    Objective: Current vital signs: BP 127/73   Pulse 86   Temp 98.3 F (36.8 C) (Oral)   Resp 20   Ht 5\' 9"  (1.753 m)   Wt 97.5 kg (214 lb 15.2 oz)   SpO2 96%   BMI 31.74 kg/m  Vital signs in last 24 hours: Temp:  [98.1 F (36.7 C)-98.5 F (36.9 C)] 98.3 F (36.8 C) (12/01 1200) Pulse Rate:  [86-97] 86 (12/01 1300) Resp:  [18-33] 20 (12/01 1300) BP: (104-156)/(49-73) 127/73 (12/01 1300) SpO2:  [92 %-99 %] 96 % (12/01 1300) Weight:  [97.5 kg (214 lb 15.2 oz)] 97.5 kg (214 lb 15.2 oz) (12/01 0500)  Intake/Output from previous day: 11/30 0701 - 12/01 0700 In: 816.5 [I.V.:65.3; NG/GT:186.3; IV Piggyback:565] Out: 1150 [Urine:1150] Intake/Output this shift: Total I/O In: 101.6 [I.V.:1.6; IV Piggyback:100] Out: -  Nutritional status: No diet orders on file  Neurologic Exam: Mental Status: Alert.Speech fluent but dysarthric. Able to follow 3 step commands without difficulty. Cranial Nerves: II: Discs flat bilaterally; Visual fields grossly normal, pupils equal, round, reactive to light and accommodation III,IV, VI: ptosis not present, extra-ocular motions intact bilaterally V,VII: smile symmetric, facial light touch sensation normal bilaterally VIII: hearing normal bilaterally IX,X: gag reflex present XI: bilateral shoulder shrug XII: midline tongue extension Motor: Moves all extremities against gravity with no focal weakness noted Sensory: Pinprick and light touch intact throughout, bilaterally    Lab Results: Basic Metabolic Panel: Recent Labs  Lab 09/02/17 0505 09/02/17 1811 09/03/17 0403 09/04/17 1629 09/06/17 0441 09/06/17 1713 09/07/17 0509  NA 138  --  140 136 137  --   --   K 3.6 3.2* 3.3* 4.1 4.1  --   --   CL 105  --  105 103 105  --   --   CO2 26  --  25 23 23   --   --   GLUCOSE 283*  --  225* 308* 312*  --   --   BUN 16  --  17 18 23*  --   --    CREATININE 0.79  --  0.86 1.00 1.07  --   --   CALCIUM 8.2*  --  8.1* 8.6* 8.4*  --   --   MG 2.0  --  2.1  --   --  2.3 2.2  PHOS 1.5* 2.0* 3.1  --   --  2.6 2.8    Liver Function Tests: Recent Labs  Lab 09/02/17 0505 09/03/17 0403  AST 22 40  ALT 23 38  ALKPHOS 59 57  BILITOT 0.9 0.4  PROT 6.0* 5.8*  ALBUMIN 2.5* 2.4*   No results for input(s): LIPASE, AMYLASE in the last 168 hours. No results for input(s): AMMONIA in the last 168 hours.  CBC: Recent Labs  Lab 09/01/17 0046 09/02/17 0505 09/03/17 0403 09/06/17 0441  WBC 9.1 8.0 6.2 15.5*  NEUTROABS  --  5.9 3.9  --   HGB 14.1 12.7* 12.6* 14.5  HCT 41.4 36.5* 36.2* 43.5  MCV 88.9 87.6 87.3 88.6  PLT 175 168 203 306    Cardiac Enzymes: No results for input(s): CKTOTAL, CKMB, CKMBINDEX, TROPONINI in the last 168 hours.  Lipid Panel: Recent Labs  Lab 09/02/17 0505  TRIG 177*    CBG: Recent Labs  Lab 09/07/17 0422 09/07/17 0528 09/07/17 0659 09/07/17 0802 09/07/17 1158  GLUCAP 134* 119* 129* 165* 164*  Microbiology: Results for orders placed or performed during the hospital encounter of 08/26/17  MRSA PCR Screening     Status: None   Collection Time: 08/27/17  3:24 AM  Result Value Ref Range Status   MRSA by PCR NEGATIVE NEGATIVE Final    Comment:        The GeneXpert MRSA Assay (FDA approved for NASAL specimens only), is one component of a comprehensive MRSA colonization surveillance program. It is not intended to diagnose MRSA infection nor to guide or monitor treatment for MRSA infections.   Culture, respiratory (NON-Expectorated)     Status: None   Collection Time: 09/01/17 12:30 AM  Result Value Ref Range Status   Specimen Description TRACHEAL ASPIRATE  Final   Special Requests NONE  Final   Gram Stain   Final    ABUNDANT WBC PRESENT, PREDOMINANTLY PMN RARE SQUAMOUS EPITHELIAL CELLS PRESENT ABUNDANT GRAM POSITIVE RODS FEW GRAM NEGATIVE RODS    Culture   Final    Consistent  with normal respiratory flora. Performed at [facility], [place] [address], [place], [place] [address]    Report Status 09/03/2017 FINAL  Final  Urine Culture     Status: None   Collection Time: 09/01/17 12:39 AM  Result Value Ref Range Status   Specimen Description URINE, RANDOM  Final   Special Requests NONE  Final   Culture   Final    NO GROWTH Performed at [facility], [place] [address] [place], [place] [address]    Report Status 09/02/2017 FINAL  Final  Culture, blood (Routine X 2) w Reflex to ID Panel     Status: None   Collection Time: 09/01/17 12:46 AM  Result Value Ref Range Status   Specimen Description BLOOD BLOOD LEFT HAND  Final   Special Requests   Final    BOTTLES DRAWN AEROBIC AND ANAEROBIC Blood Culture adequate volume   Culture NO GROWTH 5 DAYS  Final   Report Status 09/06/2017 FINAL  Final  Culture, blood (Routine X 2) w Reflex to ID Panel     Status: None   Collection Time: 09/01/17  1:00 AM  Result Value Ref Range Status   Specimen Description BLOOD RIGHT ANTECUBITAL  Final   Special Requests   Final    BOTTLES DRAWN AEROBIC AND ANAEROBIC Blood Culture adequate volume   Culture NO GROWTH 5 DAYS  Final   Report Status 09/06/2017 FINAL  Final  CULTURE, BLOOD (ROUTINE X 2) w Reflex to ID Panel     Status: None (Preliminary result)   Collection Time: 09/04/17  6:15 PM  Result Value Ref Range Status   Specimen Description BLOOD RIGHT HAND  Final   Special Requests   Final    BOTTLES DRAWN AEROBIC AND ANAEROBIC Blood Culture adequate volume   Culture NO GROWTH 3 DAYS  Final   Report Status PENDING  Incomplete  CULTURE, BLOOD (ROUTINE X 2) w Reflex to ID Panel     Status: None (Preliminary result)   Collection Time: 09/04/17  6:22 PM  Result Value Ref Range Status   Specimen Description BLOOD LEFT HAND  Final   Special Requests   Final    BOTTLES DRAWN AEROBIC AND ANAEROBIC Blood Culture adequate volume   Culture NO GROWTH 3 DAYS  Final   Report  Status PENDING  Incomplete  Urine Culture     Status: Abnormal   Collection Time: 09/05/17 12:43 AM  Result Value Ref Range Status   Specimen  Description URINE, RANDOM  Final   Special Requests NONE  Final   Culture >=100,000 COLONIES/mL ENTEROCOCCUS FAECALIS (A)  Final   Report Status 09/07/2017 FINAL  Final   Organism ID, Bacteria ENTEROCOCCUS FAECALIS (A)  Final      Susceptibility   Enterococcus faecalis - MIC*    AMPICILLIN <=2 SENSITIVE Sensitive     LEVOFLOXACIN 1 SENSITIVE Sensitive     NITROFURANTOIN <=16 SENSITIVE Sensitive     VANCOMYCIN 1 SENSITIVE Sensitive     * >=100,000 COLONIES/mL ENTEROCOCCUS FAECALIS  Culture, respiratory (NON-Expectorated)     Status: None (Preliminary result)   Collection Time: 09/06/17  9:00 AM  Result Value Ref Range Status   Specimen Description TRACHEAL ASPIRATE  Final   Special Requests NONE  Final   Gram Stain   Final    ABUNDANT WBC PRESENT,BOTH PMN AND MONONUCLEAR FEW GRAM VARIABLE ROD    Culture   Final    CULTURE REINCUBATED FOR BETTER GROWTH Performed at [facility], [place] [address] [place], [place] [address]    Report Status PENDING  Incomplete    Coagulation Studies: No results for input(s): LABPROT, INR in the last 72 hours.  Imaging: Dg Abd 1 View  Result Date: 09/07/2017 CLINICAL DATA:  NG tube placement EXAM: ABDOMEN - 1 VIEW COMPARISON:  None. FINDINGS: NG tube tip is in the mid stomach. Oral contrast material within nondistended colon. IMPRESSION: NG tube tip in the mid stomach. Electronically Signed   By: Rolm Baptise M.D.   On: 09/07/2017 10:38   Dg Chest Port 1 View  Result Date: 09/06/2017 CLINICAL DATA:  Hypoxia. EXAM: PORTABLE CHEST 1 VIEW COMPARISON:  09/05/2017. FINDINGS: Mediastinum and hilar structures normal. Cardiomegaly. Persistent bibasilar infiltrates. No significant change from prior exam. No pleural effusion or pneumothorax. IMPRESSION: Persistent bibasilar infiltrates.  No change from prior  exam. Electronically Signed   By: Marcello Moores  Register   On: 09/06/2017 10:01   Dg Loyce Dys Tube Plc W/fl W/rad  Result Date: 09/06/2017 CLINICAL DATA:  Feeding tube placement EXAM: NASO G TUBE PLACEMENT WITH FL AND WITH RAD CONTRAST:  None. FLUOROSCOPY TIME:  Fluoroscopy Time:  1 minutes 54 seconds Radiation Exposure Index (if provided by the fluoroscopic device): 48.6 mGy Number of Acquired Spot Images: 3 COMPARISON:  08/28/2017. FINDINGS: Feeding tube was successfully placed using fluoroscopic guidance. Tube tip was placed to the distal stomach/duodenum. There no complications. Tube was secured to the nose with tape. IMPRESSION: Successful fluoroscopically guided feeding tube placement. Electronically Signed   By: Marcello Moores  Register   On: 09/06/2017 15:19    Medications:  I have reviewed the patient's current medications. Scheduled: . aspirin  81 mg Per Tube Daily  . atorvastatin  80 mg Per Tube q1800  . bromocriptine  12.5 mg Per Tube Daily  . chlorhexidine  15 mL Mouth Rinse BID  . enoxaparin (LOVENOX) injection  40 mg Subcutaneous Q24H  . feeding supplement (PRO-STAT SUGAR FREE 64)  30 mL Per Tube Daily  . insulin aspart  0-20 Units Subcutaneous Q4H  . lisinopril  20 mg Per Tube Daily  . mouth rinse  15 mL Mouth Rinse q12n4p  . mirtazapine  15 mg Per NG tube QHS  . pantoprazole (PROTONIX) IV  40 mg Intravenous Q24H    Assessment/Plan: No further seizures noted.  Depakote level therapeutic at 62.  Would continue this dose.  Once able to take po may change to po dosing.    Recommendations: 1.  Depakote level in AM   LOS: 11 days   Alexis Goodell, MD Neurology [phone] 09/07/2017  2:02 PM

## 2017-09-07 NOTE — Progress Notes (Signed)
[facility] Consultation     Date: 09/07/2017,   MRN# [id number] Andrew Horton [birth year] Code Status:     Code Status Orders  (From admission, onward)        Start     Ordered   08/27/17 0250  Full code  Continuous     08/27/17 0249    Code Status History    Date Active Date Inactive Code Status Order ID Comments User Context   This patient has a current code status but no historical code status.     Hosp day:@LENGTHOFSTAYDAYS @ Referring MD: @ATDPROV @     PCP:      AdmissionWeight: 235 lb (106.6 kg)                 CurrentWeight: 214 lb 15.2 oz (97.5 kg) Andrew Horton is a 71 y.o. old male      CHIEF COMPLAINT:   3 M with hx or prolactinoma, no prior seizure history, admitted with status epilepticus and acute encephalopathy. Intubated in ED. Extubated 11/20 AM. Persistent encephalopathy requiring re-intubation 11/21 early AM. Patient self extubated since then and is now on RA.   SUBJECTIVE:    No acute issues overnight. Maintaining his saturations. Pulled his DHT out. Awake, follows commands. Denies SOB, chest pain. His cough is effective but requires significant encouragement  MEDICATIONS    Current Medication:   Current Facility-Administered Medications:  .  acetaminophen (TYLENOL) tablet 650 mg, 650 mg, Oral, Q6H PRN **OR** acetaminophen (TYLENOL) suppository 650 mg, 650 mg, Rectal, Q6H PRN, Harrie Foreman, MD, 650 mg at 09/05/17 2302 .  Ampicillin-Sulbactam (UNASYN) 3 g in sodium chloride 0.9 % 100 mL IVPB, 3 g, Intravenous, Q6H, Nettie Elm, MD, Last Rate: 200 mL/hr at 09/07/17 1208, 3 g at 09/07/17 1208 .  aspirin chewable tablet 81 mg, 81 mg, Per Tube, Daily, Nettie Elm, MD .  atorvastatin (LIPITOR) tablet 80 mg, 80 mg, Per Tube, q1800, Nettie Elm, MD, 80 mg at 09/06/17 1731 .  bromocriptine (PARLODEL) tablet 12.5 mg, 12.5 mg, Per Tube, Daily, Nettie Elm, MD .  chlorhexidine (PERIDEX) 0.12 %  solution 15 mL, 15 mL, Mouth Rinse, BID, Fritzi Mandes, MD, 15 mL at 09/07/17 1208 .  enoxaparin (LOVENOX) injection 40 mg, 40 mg, Subcutaneous, Q24H, Fritzi Mandes, MD, 40 mg at 09/07/17 1208 .  feeding supplement (PRO-STAT SUGAR FREE 64) liquid 30 mL, 30 mL, Per Tube, Daily, Nettie Elm, MD, 30 mL at 09/06/17 1731 .  feeding supplement (VITAL 1.5 CAL) liquid 1,000 mL, 1,000 mL, Per Tube, Continuous, Nettie Elm, MD, Stopped at 09/07/17 0118 .  hydrALAZINE (APRESOLINE) injection 10 mg, 10 mg, Intravenous, Q2H PRN, Flora Lipps, MD, 10 mg at 09/04/17 0055 .  insulin aspart (novoLOG) injection 0-20 Units, 0-20 Units, Subcutaneous, Q4H, Nettie Elm, MD, 4 Units at 09/07/17 1207 .  lisinopril (PRINIVIL,ZESTRIL) tablet 20 mg, 20 mg, Per Tube, Daily, Nettie Elm, MD .  LORazepam (ATIVAN) injection 2 mg, 2 mg, Intravenous, PRN, Wilhelmina Mcardle, MD, 2 mg at 09/04/17 0228 .  MEDLINE mouth rinse, 15 mL, Mouth Rinse, q12n4p, Fritzi Mandes, MD, 15 mL at 09/07/17 1208 .  metoprolol tartrate (LOPRESSOR) injection 5 mg, 5 mg, Intravenous, Q6H PRN, Awilda Bill, NP .  mirtazapine (REMERON SOL-TAB) disintegrating tablet 15 mg, 15 mg, Per NG tube, QHS, Clapacs, John T, MD, 15 mg at 09/06/17 2250 .  [DISCONTINUED] ondansetron (ZOFRAN) tablet 4 mg, 4 mg, Oral, Q6H PRN **OR** ondansetron (ZOFRAN) injection  4 mg, 4 mg, Intravenous, Q6H PRN, Harrie Foreman, MD .  pantoprazole (PROTONIX) injection 40 mg, 40 mg, Intravenous, Q24H, Nettie Elm, MD, 40 mg at 09/07/17 1207 .  valproate (DEPACON) 1,500 mg in dextrose 5 % 50 mL IVPB, 1,500 mg, Intravenous, Q12H, Alexis Goodell, MD, Stopped at 09/07/17 [id number]     REVIEW OF SYSTEMS     VS: BP (!) 143/49   Pulse 91   Temp 98.3 F (36.8 C) (Oral)   Resp (!) 21   Ht 5\' 9"  (1.753 m)   Wt 214 lb 15.2 oz (97.5 kg)   SpO2 94%   BMI 31.74 kg/m      PHYSICAL EXAM   Physical Exam Much more awake compared to yesterday, follows commands but requires  encouragement. CVS: S1, S2, 0 Chest: bilateral rhonchi with rattling. No wheezes on rales. Abd: Soft, NT, ND. LE: no edema.     LABS    Recent Labs    09/04/17 1629 09/06/17 0441  HGB  --  14.5  HCT  --  43.5  MCV  --  88.6  WBC  --  15.5*  BUN 18 23*  CREATININE 1.00 1.07  GLUCOSE 308* 312*  CALCIUM 8.6* 8.4*  ,    No results for input(s): PH in the last 72 hours.  Invalid input(s): PCO2, PO2, BASEEXCESS, BASEDEFICITE, TFT    CULTURE RESULTS   Recent Results (from the past 240 hour(s))  Culture, respiratory (NON-Expectorated)     Status: None   Collection Time: 09/01/17 12:30 AM  Result Value Ref Range Status   Specimen Description TRACHEAL ASPIRATE  Final   Special Requests NONE  Final   Gram Stain   Final    ABUNDANT WBC PRESENT, PREDOMINANTLY PMN RARE SQUAMOUS EPITHELIAL CELLS PRESENT ABUNDANT GRAM POSITIVE RODS FEW GRAM NEGATIVE RODS    Culture   Final    Consistent with normal respiratory flora. Performed at [facility], [place] [address], [place], [place] [address]    Report Status 09/03/2017 FINAL  Final  Urine Culture     Status: None   Collection Time: 09/01/17 12:39 AM  Result Value Ref Range Status   Specimen Description URINE, RANDOM  Final   Special Requests NONE  Final   Culture   Final    NO GROWTH Performed at [facility], [place] [address], [place],  [address]    Report Status 09/02/2017 FINAL  Final  Culture, blood (Routine X 2) w Reflex to ID Panel     Status: None   Collection Time: 09/01/17 12:46 AM  Result Value Ref Range Status   Specimen Description BLOOD BLOOD LEFT HAND  Final   Special Requests   Final    BOTTLES DRAWN AEROBIC AND ANAEROBIC Blood Culture adequate volume   Culture NO GROWTH 5 DAYS  Final   Report Status 09/06/2017 FINAL  Final  Culture, blood (Routine X 2) w Reflex to ID Panel     Status: None   Collection Time: 09/01/17  1:00 AM  Result Value Ref Range Status   Specimen Description BLOOD  RIGHT ANTECUBITAL  Final   Special Requests   Final    BOTTLES DRAWN AEROBIC AND ANAEROBIC Blood Culture adequate volume   Culture NO GROWTH 5 DAYS  Final   Report Status 09/06/2017 FINAL  Final  CULTURE, BLOOD (ROUTINE X 2) w Reflex to ID Panel     Status: None (Preliminary result)   Collection Time: 09/04/17  6:15  PM  Result Value Ref Range Status   Specimen Description BLOOD RIGHT HAND  Final   Special Requests   Final    BOTTLES DRAWN AEROBIC AND ANAEROBIC Blood Culture adequate volume   Culture NO GROWTH 3 DAYS  Final   Report Status PENDING  Incomplete  CULTURE, BLOOD (ROUTINE X 2) w Reflex to ID Panel     Status: None (Preliminary result)   Collection Time: 09/04/17  6:22 PM  Result Value Ref Range Status   Specimen Description BLOOD LEFT HAND  Final   Special Requests   Final    BOTTLES DRAWN AEROBIC AND ANAEROBIC Blood Culture adequate volume   Culture NO GROWTH 3 DAYS  Final   Report Status PENDING  Incomplete  Urine Culture     Status: Abnormal   Collection Time: 09/05/17 12:43 AM  Result Value Ref Range Status   Specimen Description URINE, RANDOM  Final   Special Requests NONE  Final   Culture >=100,000 COLONIES/mL ENTEROCOCCUS FAECALIS (A)  Final   Report Status 09/07/2017 FINAL  Final   Organism ID, Bacteria ENTEROCOCCUS FAECALIS (A)  Final      Susceptibility   Enterococcus faecalis - MIC*    AMPICILLIN <=2 SENSITIVE Sensitive     LEVOFLOXACIN 1 SENSITIVE Sensitive     NITROFURANTOIN <=16 SENSITIVE Sensitive     VANCOMYCIN 1 SENSITIVE Sensitive     * >=100,000 COLONIES/mL ENTEROCOCCUS FAECALIS  Culture, respiratory (NON-Expectorated)     Status: None (Preliminary result)   Collection Time: 09/06/17  9:00 AM  Result Value Ref Range Status   Specimen Description TRACHEAL ASPIRATE  Final   Special Requests NONE  Final   Gram Stain   Final    ABUNDANT WBC PRESENT,BOTH PMN AND MONONUCLEAR FEW GRAM VARIABLE ROD    Culture   Final    CULTURE REINCUBATED FOR  BETTER GROWTH Performed at [facility], [place] [address] [place], [place] [address]    Report Status PENDING  Incomplete          IMAGING    Dg Abd 1 View  Result Date: 09/07/2017 CLINICAL DATA:  NG tube placement EXAM: ABDOMEN - 1 VIEW COMPARISON:  None. FINDINGS: NG tube tip is in the mid stomach. Oral contrast material within nondistended colon. IMPRESSION: NG tube tip in the mid stomach. Electronically Signed   By: Rolm Baptise M.D.   On: 09/07/2017 10:38   Dg Addison Bailey G Tube Plc W/fl W/rad  Result Date: 09/06/2017 CLINICAL DATA:  Feeding tube placement EXAM: NASO G TUBE PLACEMENT WITH FL AND WITH RAD CONTRAST:  None. FLUOROSCOPY TIME:  Fluoroscopy Time:  1 minutes 54 seconds Radiation Exposure Index (if provided by the fluoroscopic device): 48.6 mGy Number of Acquired Spot Images: 3 COMPARISON:  08/28/2017. FINDINGS: Feeding tube was successfully placed using fluoroscopic guidance. Tube tip was placed to the distal stomach/duodenum. There no complications. Tube was secured to the nose with tape. IMPRESSION: Successful fluoroscopically guided feeding tube placement. Electronically Signed   By: Marcello Moores  Register   On: 09/06/2017 15:19         ASSESSMENT/PLAN    60 M with hx or prolactinoma, no prior seizure history, admitted with status epilepticus and acute encephalopathy. Intubated in ED. Extubated 11/20 AM. Persistent encephalopathy requiring re-intubation 11/21 early AM. Patient self extubated since then.  Problem list: New onset seizures Enterococcus UTI Dysphagia with aspiration DM2 with hyperglycemia Acute encephalopathy, much improved Deconditioning Intracranial mass on MRI   Resolved issues: Status  epilepticus Acute ventilator dependent respiratory failure, resolved Hypotension, resolved AKI, nonoliguric, resolved   Plan:  Appreciate neurology's help.  Continue valproic acid dosing per neurology. 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 Will defer further f/u management to neurology  His O2 requirements have remained low. Continue chest PT and nasopharyngeal suctioning.  NPO for aspiration. Patients Dobbhoff tube out that was placed by IR yesterday.  We placed another NG tube this morning but the patient pulled it out again.    Follow sputum cultures.  Enterococcus UTI is sensitive to ampicillin.  He was started on vancomycin last night.  DC vancomycin.  Given that he is on [place] for possible pneumonia and now enterococcus is sensitive to ampicillin, will DC the [facility] and place the patient on Unasyn.  Continue bromoctriptine  Continune lisinopril  Continue metoprolol  Re-start home lipitor.  Continue ASA.  Continue holding plavix as we don't know what it was meant for and for any possible invasive interventions.   Hyperglycemia significantly improved.  Insulin drip stopped this morning. Continue sliding scale.  Not starting Lantus as the patient is not eating at this time.  Continue lovenox for DVT prophylxis. Protonix for SUP.  Case management to assess for placement to LTAC.  Transfer to telemetry.   Nettie Elm, M.D.  Pulmonary & Critical Care Medicine

## 2017-09-07 NOTE — Progress Notes (Signed)
Physical Therapy Treatment Patient Details Name: Andrew Horton MRN: [id number] DOB: [birth year] Today's Date: 09/07/2017    History of Present Illness 71yo male with PMHx or prolactinoma, no prior seizure history, admitted with status epilepticus and acute encephalopathy. Intubated in ED on 11/19, extubated 11/20 AM. Persistent encephalopathy requiring re-intubation 11/21 early AM and self extubated on 11/24. 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 Seen for OT and PT evaluations on 11/27.    PT Comments    Participated in exercises as described below.  Pt generally lethargic, asking for a drink and given ice chips per primary nurse.  Pt assisted at times with exercises with encouragement but varied between [facility].  X-ray in during session and required max a x 2 to reposition and slide up in bed.  Discussed with primary nurse regarding mobility this am.  Deferred at this time per her request due to care and agitation this am.  Will continue as appropriate.  Pt remains globally weak.   Follow Up Recommendations  CIR     Equipment Recommendations       Recommendations for Other Services       Precautions / Restrictions Precautions Precautions: Fall Restrictions Weight Bearing Restrictions: No    Mobility  Bed Mobility Overal bed mobility: Needs Assistance Bed Mobility: Rolling Rolling: Max assist;+2 for safety/equipment         General bed mobility comments: extensive cues and encouragement  Transfers                    Ambulation/Gait                 Stairs            Wheelchair Mobility    Modified Rankin (Stroke Patients Only)       Balance                                            Cognition Arousal/Alertness: Lethargic   Overall Cognitive Status: Impaired/Different from baseline                                        Exercises Total Joint Exercises Ankle Circles/Pumps: AAROM;Both;10 reps Quad Sets: AAROM;Both;10 reps Short Arc QuadSinclair Ship;Both;10 reps Hip ABduction/ADduction: AAROM;Both;10 reps Straight Leg Raises: AAROM;Both;10 reps    General Comments        Pertinent Vitals/Pain Pain Assessment: No/denies pain    Home Living                      Prior Function            PT Goals (current goals can now be found in the care plan section)      Frequency    Min 2X/week      PT Plan Current plan remains appropriate    Co-evaluation              AM-PAC PT "6 Clicks" Daily Activity  Outcome Measure  Difficulty turning over in bed (including adjusting bedclothes, sheets and blankets)?: Unable Difficulty moving from lying on back to sitting on the side of the bed? : Unable Difficulty sitting down on and standing  up from a chair with arms (e.g., wheelchair, bedside commode, etc,.)?: Unable Help needed moving to and from a bed to chair (including a wheelchair)?: Total Help needed walking in hospital room?: Total Help needed climbing 3-5 steps with a railing? : Total 6 Click Score: 6    End of Session Equipment Utilized During Treatment: Oxygen Activity Tolerance: Patient limited by fatigue;Patient limited by lethargy Patient left: in bed;with call bell/phone within reach;with bed alarm set;with nursing/sitter in room Nurse Communication: Other (comment)       Time: 1014-1030 PT Time Calculation (min) (ACUTE ONLY): 16 min  Charges:  $Therapeutic Exercise: 8-22 mins                    G Codes:       Chesley Noon, PTA 09/07/17, 10:41 AM

## 2017-09-08 LAB — GLUCOSE, CAPILLARY
Glucose-Capillary: 127 mg/dL — ABNORMAL HIGH (ref 65–99)
Glucose-Capillary: 128 mg/dL — ABNORMAL HIGH (ref 65–99)
Glucose-Capillary: 132 mg/dL — ABNORMAL HIGH (ref 65–99)
Glucose-Capillary: 134 mg/dL — ABNORMAL HIGH (ref 65–99)
Glucose-Capillary: 136 mg/dL — ABNORMAL HIGH (ref 65–99)
Glucose-Capillary: 149 mg/dL — ABNORMAL HIGH (ref 65–99)

## 2017-09-08 LAB — PHOSPHORUS: PHOSPHORUS: 2.6 mg/dL (ref 2.5–4.6)

## 2017-09-08 LAB — CULTURE, RESPIRATORY: CULTURE: NORMAL

## 2017-09-08 LAB — MAGNESIUM: Magnesium: 2.4 mg/dL (ref 1.7–2.4)

## 2017-09-08 LAB — CULTURE, RESPIRATORY W GRAM STAIN

## 2017-09-08 LAB — VALPROIC ACID LEVEL: Valproic Acid Lvl: 81 ug/mL (ref 50.0–100.0)

## 2017-09-08 MED ORDER — INSULIN ASPART 100 UNIT/ML ~~LOC~~ SOLN
0.0000 [IU] | Freq: Three times a day (TID) | SUBCUTANEOUS | Status: DC
  Administered 2017-09-08 – 2017-09-09 (×3): 1 [IU] via SUBCUTANEOUS
  Filled 2017-09-08 (×2): qty 1

## 2017-09-08 MED ORDER — INSULIN ASPART 100 UNIT/ML ~~LOC~~ SOLN: 0.0000 [IU] | Freq: Every day | SUBCUTANEOUS | Status: DC

## 2017-09-08 NOTE — Plan of Care (Signed)
  Progressing Safety: Ability to remain free from injury will improve 09/08/2017 0513 - Progressing by Loran Senters, RN

## 2017-09-08 NOTE — Progress Notes (Signed)
Subjective: Patient unchanged.  Continues to have difficulty with cough and voice.  Agitated at times  Objective: Current vital signs: BP (!) 135/49 (BP Location: Left Arm)   Pulse 82   Temp 98.1 F (36.7 C) (Oral)   Resp 18   Ht 5\' 9"  (1.753 m)   Wt 99.2 kg (218 lb 9.6 oz)   SpO2 94%   BMI 32.28 kg/m  Vital signs in last 24 hours: Temp:  [97.8 F (36.6 C)-99.5 F (37.5 C)] 98.1 F (36.7 C) (12/02 0800) Pulse Rate:  [82-99] 82 (12/02 0800) Resp:  [18-26] 18 (12/02 0800) BP: (132-174)/(49-79) 135/49 (12/02 0800) SpO2:  [94 %-96 %] 94 % (12/02 0800) Weight:  [99.2 kg (218 lb 9.6 oz)-100.4 kg (221 lb 4.8 oz)] 99.2 kg (218 lb 9.6 oz) (12/02 0530)  Intake/Output from previous day: 12/01 0701 - 12/02 0700 In: 101.6 [I.V.:1.6; IV Piggyback:100] Out: -  Intake/Output this shift: No intake/output data recorded. Nutritional status: No diet orders on file  Neurologic Exam: Mental Status: Alert.Speech fluent but dysarthric. Able to follow 3 step commands without difficulty. Cranial Nerves: II: Discs flat bilaterally; Visual fields grossly normal, pupils equal, round, reactive to light and accommodation III,IV, VI: ptosis not present, extra-ocular motions intact bilaterally V,VII: smile symmetric, facial light touch sensation normal bilaterally VIII: hearing normal bilaterally IX,X: gag reflex present XI: bilateral shoulder shrug XII: midline tongue extension Motor: Moves all extremities against gravity with no focal weakness noted Sensory: Pinprick and light touch intact throughout, bilaterally  Lab Results: Basic Metabolic Panel: Recent Labs  Lab 09/02/17 0505 09/02/17 1811 09/03/17 0403 09/04/17 1629 09/06/17 0441 09/06/17 1713 09/07/17 0509 09/07/17 1653 09/08/17 0507  NA 138  --  140 136 137  --   --   --   --   K 3.6 3.2* 3.3* 4.1 4.1  --   --   --   --   CL 105  --  105 103 105  --   --   --   --   CO2 26  --  25 23 23   --   --   --   --   GLUCOSE 283*   --  225* 308* 312*  --   --   --   --   BUN 16  --  17 18 23*  --   --   --   --   CREATININE 0.79  --  0.86 1.00 1.07  --   --   --   --   CALCIUM 8.2*  --  8.1* 8.6* 8.4*  --   --   --   --   MG 2.0  --  2.1  --   --  2.3 2.2 2.4 2.4  PHOS 1.5* 2.0* 3.1  --   --  2.6 2.8 3.4 2.6    Liver Function Tests: Recent Labs  Lab 09/02/17 0505 09/03/17 0403  AST 22 40  ALT 23 38  ALKPHOS 59 57  BILITOT 0.9 0.4  PROT 6.0* 5.8*  ALBUMIN 2.5* 2.4*   No results for input(s): LIPASE, AMYLASE in the last 168 hours. No results for input(s): AMMONIA in the last 168 hours.  CBC: Recent Labs  Lab 09/02/17 0505 09/03/17 0403 09/06/17 0441  WBC 8.0 6.2 15.5*  NEUTROABS 5.9 3.9  --   HGB 12.7* 12.6* 14.5  HCT 36.5* 36.2* 43.5  MCV 87.6 87.3 88.6  PLT 168 203 306    Cardiac Enzymes: No results for input(s):  CKTOTAL, CKMB, CKMBINDEX, TROPONINI in the last 168 hours.  Lipid Panel: Recent Labs  Lab 09/02/17 0505  TRIG 177*    CBG: Recent Labs  Lab 09/07/17 2339 09/08/17 0054 09/08/17 0321 09/08/17 0813 09/08/17 1219  GLUCAP 146* 127* 128* 33* 136*    Microbiology: Results for orders placed or performed during the hospital encounter of 08/26/17  MRSA PCR Screening     Status: None   Collection Time: 08/27/17  3:24 AM  Result Value Ref Range Status   MRSA by PCR NEGATIVE NEGATIVE Final    Comment:        The GeneXpert MRSA Assay (FDA approved for NASAL specimens only), is one component of a comprehensive MRSA colonization surveillance program. It is not intended to diagnose MRSA infection nor to guide or monitor treatment for MRSA infections.   Culture, respiratory (NON-Expectorated)     Status: None   Collection Time: 09/01/17 12:30 AM  Result Value Ref Range Status   Specimen Description TRACHEAL ASPIRATE  Final   Special Requests NONE  Final   Gram Stain   Final    ABUNDANT WBC PRESENT, PREDOMINANTLY PMN RARE SQUAMOUS EPITHELIAL CELLS PRESENT ABUNDANT GRAM  POSITIVE RODS FEW GRAM NEGATIVE RODS    Culture   Final    Consistent with normal respiratory flora. Performed at [facility], [place] [address], [place], [place] [address]    Report Status 09/03/2017 FINAL  Final  Urine Culture     Status: None   Collection Time: 09/01/17 12:39 AM  Result Value Ref Range Status   Specimen Description URINE, RANDOM  Final   Special Requests NONE  Final   Culture   Final    NO GROWTH Performed at [facility], [place] [address], [place], [place] [address]    Report Status 09/02/2017 FINAL  Final  Culture, blood (Routine X 2) w Reflex to ID Panel     Status: None   Collection Time: 09/01/17 12:46 AM  Result Value Ref Range Status   Specimen Description BLOOD BLOOD LEFT HAND  Final   Special Requests   Final    BOTTLES DRAWN AEROBIC AND ANAEROBIC Blood Culture adequate volume   Culture NO GROWTH 5 DAYS  Final   Report Status 09/06/2017 FINAL  Final  Culture, blood (Routine X 2) w Reflex to ID Panel     Status: None   Collection Time: 09/01/17  1:00 AM  Result Value Ref Range Status   Specimen Description BLOOD RIGHT ANTECUBITAL  Final   Special Requests   Final    BOTTLES DRAWN AEROBIC AND ANAEROBIC Blood Culture adequate volume   Culture NO GROWTH 5 DAYS  Final   Report Status 09/06/2017 FINAL  Final  CULTURE, BLOOD (ROUTINE X 2) w Reflex to ID Panel     Status: None (Preliminary result)   Collection Time: 09/04/17  6:15 PM  Result Value Ref Range Status   Specimen Description BLOOD RIGHT HAND  Final   Special Requests   Final    BOTTLES DRAWN AEROBIC AND ANAEROBIC Blood Culture adequate volume   Culture NO GROWTH 4 DAYS  Final   Report Status PENDING  Incomplete  CULTURE, BLOOD (ROUTINE X 2) w Reflex to ID Panel     Status: None (Preliminary result)   Collection Time: 09/04/17  6:22 PM  Result Value Ref Range Status   Specimen Description BLOOD LEFT HAND  Final   Special Requests   Final    BOTTLES DRAWN  AEROBIC AND ANAEROBIC  Blood Culture adequate volume   Culture NO GROWTH 4 DAYS  Final   Report Status PENDING  Incomplete  Urine Culture     Status: Abnormal   Collection Time: 09/05/17 12:43 AM  Result Value Ref Range Status   Specimen Description URINE, RANDOM  Final   Special Requests NONE  Final   Culture >=100,000 COLONIES/mL ENTEROCOCCUS FAECALIS (A)  Final   Report Status 09/07/2017 FINAL  Final   Organism ID, Bacteria ENTEROCOCCUS FAECALIS (A)  Final      Susceptibility   Enterococcus faecalis - MIC*    AMPICILLIN <=2 SENSITIVE Sensitive     LEVOFLOXACIN 1 SENSITIVE Sensitive     NITROFURANTOIN <=16 SENSITIVE Sensitive     VANCOMYCIN 1 SENSITIVE Sensitive     * >=100,000 COLONIES/mL ENTEROCOCCUS FAECALIS  Culture, respiratory (NON-Expectorated)     Status: None   Collection Time: 09/06/17  9:00 AM  Result Value Ref Range Status   Specimen Description TRACHEAL ASPIRATE  Final   Special Requests NONE  Final   Gram Stain   Final    ABUNDANT WBC PRESENT,BOTH PMN AND MONONUCLEAR FEW GRAM VARIABLE ROD    Culture   Final    Consistent with normal respiratory flora. Performed at [facility], [place] [address], [place], [place] [address]    Report Status 09/08/2017 FINAL  Final    Coagulation Studies: No results for input(s): LABPROT, INR in the last 72 hours.  Imaging: Dg Abd 1 View  Result Date: 09/07/2017 CLINICAL DATA:  NG tube placement EXAM: ABDOMEN - 1 VIEW COMPARISON:  None. FINDINGS: NG tube tip is in the mid stomach. Oral contrast material within nondistended colon. IMPRESSION: NG tube tip in the mid stomach. Electronically Signed   By: Rolm Baptise M.D.   On: 09/07/2017 10:38   Dg Addison Bailey G Tube Plc W/fl W/rad  Result Date: 09/06/2017 CLINICAL DATA:  Feeding tube placement EXAM: NASO G TUBE PLACEMENT WITH FL AND WITH RAD CONTRAST:  None. FLUOROSCOPY TIME:  Fluoroscopy Time:  1 minutes 54 seconds Radiation Exposure Index (if provided by the fluoroscopic device): 48.6 mGy Number of  Acquired Spot Images: 3 COMPARISON:  08/28/2017. FINDINGS: Feeding tube was successfully placed using fluoroscopic guidance. Tube tip was placed to the distal stomach/duodenum. There no complications. Tube was secured to the nose with tape. IMPRESSION: Successful fluoroscopically guided feeding tube placement. Electronically Signed   By: Marcello Moores  Register   On: 09/06/2017 15:19    Medications:  I have reviewed the patient's current medications. Scheduled: . aspirin  81 mg Per Tube Daily  . atorvastatin  80 mg Per Tube q1800  . bromocriptine  12.5 mg Per Tube Daily  . chlorhexidine  15 mL Mouth Rinse BID  . enoxaparin (LOVENOX) injection  40 mg Subcutaneous Q24H  . feeding supplement (PRO-STAT SUGAR FREE 64)  30 mL Per Tube Daily  . insulin aspart  0-5 Units Subcutaneous QHS  . insulin aspart  0-9 Units Subcutaneous TID WC  . lisinopril  20 mg Per Tube Daily  . mouth rinse  15 mL Mouth Rinse q12n4p  . mirtazapine  15 mg Per NG tube QHS  . pantoprazole (PROTONIX) IV  40 mg Intravenous Q24H    Assessment/Plan: Patient stable.  No further seizures.  Depakote level 81.    Recommendations: 1.  Depakote level in AM   LOS: 12 days   Alexis Goodell, MD Neurology [phone] 09/08/2017  1:33 PM

## 2017-09-08 NOTE — Progress Notes (Signed)
[facility] NAME: Andrew Horton    MR#:  [id number]  DATE OF BIRTH:  07-Oct-1946  SUBJECTIVE:  CHIEF COMPLAINT:   Chief Complaint  Patient presents with  . Seizures   - concerns for aspiration, remains NPO today - getting more alert, still weak upper body strength  REVIEW OF SYSTEMS:  Review of Systems  Constitutional: Positive for malaise/fatigue. Negative for chills and fever.  Respiratory: Positive for cough. Negative for shortness of breath and wheezing.   Cardiovascular: Negative for chest pain and palpitations.  Gastrointestinal: Negative for abdominal pain, constipation, diarrhea, nausea and vomiting.  Genitourinary: Negative for dysuria.  Musculoskeletal: Positive for myalgias.  Neurological: Positive for weakness. Negative for dizziness, speech change, focal weakness, seizures and headaches.  Psychiatric/Behavioral: Negative for depression.    DRUG ALLERGIES:   Allergies  Allergen Reactions  . Sulfa Antibiotics Hives    VITALS:  Blood pressure (!) 135/49, pulse 82, temperature 98.1 F (36.7 C), temperature source Oral, resp. rate 18, height 5\' 9"  (1.753 m), weight 99.2 kg (218 lb 9.6 oz), SpO2 94 %.  PHYSICAL EXAMINATION:  Physical Exam  GENERAL:  71 y.o.-year-old ill appearing patient lying in the bed with no acute distress.  EYES: Pupils equal, round, reactive to light and accommodation. No scleral icterus. Extraocular muscles intact.  HEENT: Head atraumatic, normocephalic. Oropharynx and nasopharynx clear.  NECK:  Supple, no jugular venous distention. No thyroid enlargement, no tenderness.  LUNGS: Normal breath sounds bilaterally, no wheezing, rales or crepitation. Occasional rhonchi heard. No use of accessory muscles of respiration. Decreased bibasilar breath sounds Some secretions heard in upper airway. Cough reflexes a little stronger today CARDIOVASCULAR: S1, S2 normal. No murmurs, rubs, or gallops.    ABDOMEN: Soft, nontender, nondistended. Bowel sounds present. No organomegaly or mass.  EXTREMITIES: No pedal edema, cyanosis, or clubbing.  NEUROLOGIC: Following simple commands at this time.  PSYCHIATRIC: The patient is alert and oriented x 1-2 SKIN: No obvious rash, lesion, or ulcer.    LABORATORY PANEL:   CBC Recent Labs  Lab 09/06/17 0441  WBC 15.5*  HGB 14.5  HCT 43.5  PLT 306   ------------------------------------------------------------------------------------------------------------------  Chemistries  Recent Labs  Lab 09/03/17 0403  09/06/17 0441  09/08/17 0507  NA 140   < > 137  --   --   K 3.3*   < > 4.1  --   --   CL 105   < > 105  --   --   CO2 25   < > 23  --   --   GLUCOSE 225*   < > 312*  --   --   BUN 17   < > 23*  --   --   CREATININE 0.86   < > 1.07  --   --   CALCIUM 8.1*   < > 8.4*  --   --   MG 2.1  --   --    < > 2.4  AST 40  --   --   --   --   ALT 38  --   --   --   --   ALKPHOS 57  --   --   --   --   BILITOT 0.4  --   --   --   --    < > = values in this interval not displayed.   ------------------------------------------------------------------------------------------------------------------  Cardiac Enzymes No results for input(s): TROPONINI in  the last 168 hours. ------------------------------------------------------------------------------------------------------------------  RADIOLOGY:  Dg Abd 1 View  Result Date: 09/07/2017 CLINICAL DATA:  NG tube placement EXAM: ABDOMEN - 1 VIEW COMPARISON:  None. FINDINGS: NG tube tip is in the mid stomach. Oral contrast material within nondistended colon. IMPRESSION: NG tube tip in the mid stomach. Electronically Signed   By: Rolm Baptise M.D.   On: 09/07/2017 10:38   Dg Chest Port 1 View  Result Date: 09/06/2017 CLINICAL DATA:  Hypoxia. EXAM: PORTABLE CHEST 1 VIEW COMPARISON:  09/05/2017. FINDINGS: Mediastinum and hilar structures normal. Cardiomegaly. Persistent bibasilar infiltrates. No  significant change from prior exam. No pleural effusion or pneumothorax. IMPRESSION: Persistent bibasilar infiltrates.  No change from prior exam. Electronically Signed   By: Marcello Moores  Register   On: 09/06/2017 10:01   Dg Loyce Dys Tube Plc W/fl W/rad  Result Date: 09/06/2017 CLINICAL DATA:  Feeding tube placement EXAM: NASO G TUBE PLACEMENT WITH FL AND WITH RAD CONTRAST:  None. FLUOROSCOPY TIME:  Fluoroscopy Time:  1 minutes 54 seconds Radiation Exposure Index (if provided by the fluoroscopic device): 48.6 mGy Number of Acquired Spot Images: 3 COMPARISON:  08/28/2017. FINDINGS: Feeding tube was successfully placed using fluoroscopic guidance. Tube tip was placed to the distal stomach/duodenum. There no complications. Tube was secured to the nose with tape. IMPRESSION: Successful fluoroscopically guided feeding tube placement. Electronically Signed   By: Marcello Moores  Register   On: 09/06/2017 15:19    EKG:   Orders placed or performed during the hospital encounter of 08/26/17  . EKG 12-Lead  . EKG 12-Lead  . EKG 12-Lead  . EKG 12-Lead    ASSESSMENT AND PLAN:   71 year old male with no significant family here was brought into the hospital secondary to unresponsiveness and was noted to be in status epilepticus  #1 status epilepticus-on admission requiring intubation. -New onset seizures causing acute encephalopathy -Appreciate neurology consult. 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 -Patient was lethargic with Keppra and so changed to Depakote. Follow-up levels-dose being adjusted by neurology. -More alert and oriented to self at this time  #2 acute respiratory failure-possible aspiration. Extubated currently , on nasal cannula now -Have and has a weak cough and trouble clearing Secretions -Encouraged incentive spirometry, physical activity and continue suctioning as  needed -on Fortaz and receiving Lasix as needed -Failed swallow study. Pulled out his dobhoff tube, NPO today- speech to reassess tomorrow  #3 apathy and depression-relating to his current situation. Appreciate psych consult. Started on Remeron.    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 -Will need aggressive physical therapy  #7 DVT prophylaxis-on Lovenox    Physical therapy recommended acute inpatient rehab    All the records are reviewed and case discussed with Care Management/Social Workerr. Management plans discussed with the patient, family and they are in agreement.  CODE STATUS: Full Code  TOTAL TIME TAKING CARE OF THIS PATIENT: 38 minutes.   POSSIBLE D/C IN 3-4 DAYS, DEPENDING ON CLINICAL CONDITION.   Gladstone Lighter M.D on 09/08/2017 at 8:28 AM  Between 7am to 6pm - Pager - [phone]  After 6pm go to [email or web address] - password [email or web address] [facility]  Office  [phone]  CC: Primary care physician; System, Pcp Not In

## 2017-09-08 NOTE — Progress Notes (Signed)
Pt too groggy at this time to do IS and PEP therapy , will attempt at a later time.

## 2017-09-08 NOTE — Progress Notes (Signed)
[facility] Consult   Reason for Consult: Follow-up consult 71 year old man with recent seizures and respiratory problems in the intensive care unit. Referring Physician:  Tressia Miners  09/08/2017 4:38 PM Andrew Horton  MRN:  [id number] Subjective:  On the floor.  Still difficult to talk to and does not want to write.  States he is fine.  He is depressed but denies SI.  Denies pain, denies SE.  Principal Problem: Adjustment disorder with mixed anxiety and depressed mood Diagnosis:   Patient Active Problem List   Diagnosis Date Noted  . Seizure ([place]) [R56.9]   . Adjustment disorder with mixed anxiety and depressed mood [F43.23] 09/04/2017  . Dysthymia [F34.1] 09/04/2017  . Acute respiratory failure ([place]) [J96.00]   . Status epilepticus ([place]) [G40.901] 08/27/2017   Total Time spent with patient: 20 minutes  Past Medical History:  Past Medical History:  Diagnosis Date  . Brain tumor (benign) ([place])     Family History: History reviewed. No pertinent family history.  Social History:  Social History   Substance and Sexual Activity  Alcohol Use No  . Frequency: Never     Social History   Substance and Sexual Activity  Drug Use No    Social History   Socioeconomic History  . Marital status: Single    Spouse name: None  . Number of children: None  . Years of education: None  . Highest education level: None  Social Needs  . Financial resource strain: None  . Food insecurity - worry: None  . Food insecurity - inability: None  . Transportation needs - medical: None  . Transportation needs - non-medical: None  Occupational History  . None  Tobacco Use  . Smoking status: Never Smoker  . Smokeless tobacco: Never Used  Substance and Sexual Activity  . Alcohol use: No    Frequency: Never  . Drug use: No  . Sexual activity: None  Other Topics Concern  . None  Social History Narrative  . None   Additional Social History:                          Current Medications: Current Facility-Administered Medications  Medication Dose Route Frequency Provider Last Rate Last Dose  . acetaminophen (TYLENOL) tablet 650 mg  650 mg Oral Q6H PRN Harrie Foreman, MD       Or  . acetaminophen (TYLENOL) suppository 650 mg  650 mg Rectal Q6H PRN Harrie Foreman, MD   650 mg at 09/05/17 2302  . Ampicillin-Sulbactam (UNASYN) 3 g in sodium chloride 0.9 % 100 mL IVPB  3 g Intravenous Q6H Nettie Elm, MD   Stopped at 09/08/17 1117  . aspirin chewable tablet 81 mg  81 mg Per Tube Daily Nettie Elm, MD      . atorvastatin (LIPITOR) tablet 80 mg  80 mg Per Tube q1800 Nettie Elm, MD   80 mg at 09/06/17 1731  . bromocriptine (PARLODEL) tablet 12.5 mg  12.5 mg Per Tube Daily Nettie Elm, MD      . chlorhexidine (PERIDEX) 0.12 % solution 15 mL  15 mL Mouth Rinse BID Fritzi Mandes, MD   15 mL at 09/08/17 1047  . enoxaparin (LOVENOX) injection 40 mg  40 mg Subcutaneous Q24H Fritzi Mandes, MD   40 mg at 09/08/17 1047  . feeding supplement (PRO-STAT SUGAR FREE 64) liquid 30 mL  30 mL Per Tube Daily Nettie Elm, MD   30 mL at 09/06/17  1731  . feeding supplement (VITAL 1.5 CAL) liquid 1,000 mL  1,000 mL Per Tube Continuous Nettie Elm, MD   Stopped at 09/07/17 0118  . hydrALAZINE (APRESOLINE) injection 10 mg  10 mg Intravenous Q2H PRN Flora Lipps, MD   10 mg at 09/04/17 0055  . insulin aspart (novoLOG) injection 0-5 Units  0-5 Units Subcutaneous QHS Gladstone Lighter, MD      . insulin aspart (novoLOG) injection 0-9 Units  0-9 Units Subcutaneous TID WC Gladstone Lighter, MD   1 Units at 09/08/17 1253  . lisinopril (PRINIVIL,ZESTRIL) tablet 20 mg  20 mg Per Tube Daily Nettie Elm, MD      . LORazepam (ATIVAN) injection 2 mg  2 mg Intravenous PRN Wilhelmina Mcardle, MD   2 mg at 09/04/17 0228  . MEDLINE mouth rinse  15 mL Mouth Rinse q12n4p Fritzi Mandes, MD   15 mL at 09/07/17 1800  . metoprolol tartrate (LOPRESSOR) injection 5 mg  5 mg Intravenous Q6H PRN  Awilda Bill, NP      . mirtazapine (REMERON SOL-TAB) disintegrating tablet 15 mg  15 mg Per NG tube QHS Clapacs, Madie Reno, MD   Stopped at 09/07/17 2110  . ondansetron (ZOFRAN) injection 4 mg  4 mg Intravenous Q6H PRN Harrie Foreman, MD      . pantoprazole (PROTONIX) injection 40 mg  40 mg Intravenous Q24H Nettie Elm, MD   40 mg at 09/08/17 1108  . valproate (DEPACON) 1,500 mg in dextrose 5 % 50 mL IVPB  1,500 mg Intravenous Q12H Alexis Goodell, MD   Stopped at 09/08/17 0440    Lab Results:  Results for orders placed or performed during the hospital encounter of 08/26/17 (from the past 48 hour(s))  Glucose, capillary     Status: Abnormal   Collection Time: 09/06/17  5:10 PM  Result Value Ref Range   Glucose-Capillary 161 (H) 65 - 99 mg/dL  Magnesium     Status: None   Collection Time: 09/06/17  5:13 PM  Result Value Ref Range   Magnesium 2.3 1.7 - 2.4 mg/dL  Phosphorus     Status: None   Collection Time: 09/06/17  5:13 PM  Result Value Ref Range   Phosphorus 2.6 2.5 - 4.6 mg/dL  Glucose, capillary     Status: Abnormal   Collection Time: 09/06/17  6:28 PM  Result Value Ref Range   Glucose-Capillary 142 (H) 65 - 99 mg/dL  Glucose, capillary     Status: Abnormal   Collection Time: 09/06/17  7:34 PM  Result Value Ref Range   Glucose-Capillary 121 (H) 65 - 99 mg/dL   Comment 1 Notify RN    Comment 2 Document in Chart   Glucose, capillary     Status: Abnormal   Collection Time: 09/06/17  8:39 PM  Result Value Ref Range   Glucose-Capillary 125 (H) 65 - 99 mg/dL   Comment 1 Notify RN    Comment 2 Document in Chart   Glucose, capillary     Status: Abnormal   Collection Time: 09/06/17  9:54 PM  Result Value Ref Range   Glucose-Capillary 130 (H) 65 - 99 mg/dL   Comment 1 Notify RN    Comment 2 Document in Chart   Glucose, capillary     Status: Abnormal   Collection Time: 09/06/17 10:57 PM  Result Value Ref Range   Glucose-Capillary 184 (H) 65 - 99 mg/dL   Comment 1  Notify RN   Glucose, capillary  Status: Abnormal   Collection Time: 09/07/17 12:01 AM  Result Value Ref Range   Glucose-Capillary 161 (H) 65 - 99 mg/dL  Glucose, capillary     Status: Abnormal   Collection Time: 09/07/17  1:05 AM  Result Value Ref Range   Glucose-Capillary 154 (H) 65 - 99 mg/dL   Comment 1 Notify RN   Glucose, capillary     Status: Abnormal   Collection Time: 09/07/17  2:10 AM  Result Value Ref Range   Glucose-Capillary 167 (H) 65 - 99 mg/dL   Comment 1 Notify RN    Comment 2 Document in Chart   Glucose, capillary     Status: Abnormal   Collection Time: 09/07/17  3:14 AM  Result Value Ref Range   Glucose-Capillary 162 (H) 65 - 99 mg/dL   Comment 1 Notify RN    Comment 2 Document in Chart   Glucose, capillary     Status: Abnormal   Collection Time: 09/07/17  4:22 AM  Result Value Ref Range   Glucose-Capillary 134 (H) 65 - 99 mg/dL  Magnesium     Status: None   Collection Time: 09/07/17  5:09 AM  Result Value Ref Range   Magnesium 2.2 1.7 - 2.4 mg/dL  Phosphorus     Status: None   Collection Time: 09/07/17  5:09 AM  Result Value Ref Range   Phosphorus 2.8 2.5 - 4.6 mg/dL  Valproic acid level     Status: None   Collection Time: 09/07/17  5:09 AM  Result Value Ref Range   Valproic Acid Lvl 62 50.0 - 100.0 ug/mL  Glucose, capillary     Status: Abnormal   Collection Time: 09/07/17  5:28 AM  Result Value Ref Range   Glucose-Capillary 119 (H) 65 - 99 mg/dL   Comment 1 Notify RN    Comment 2 Document in Chart   Glucose, capillary     Status: Abnormal   Collection Time: 09/07/17  6:59 AM  Result Value Ref Range   Glucose-Capillary 129 (H) 65 - 99 mg/dL  Glucose, capillary     Status: Abnormal   Collection Time: 09/07/17  8:02 AM  Result Value Ref Range   Glucose-Capillary 165 (H) 65 - 99 mg/dL  Glucose, capillary     Status: Abnormal   Collection Time: 09/07/17 11:58 AM  Result Value Ref Range   Glucose-Capillary 164 (H) 65 - 99 mg/dL  Glucose,  capillary     Status: Abnormal   Collection Time: 09/07/17  4:47 PM  Result Value Ref Range   Glucose-Capillary 166 (H) 65 - 99 mg/dL  Magnesium     Status: None   Collection Time: 09/07/17  4:53 PM  Result Value Ref Range   Magnesium 2.4 1.7 - 2.4 mg/dL  Phosphorus     Status: None   Collection Time: 09/07/17  4:53 PM  Result Value Ref Range   Phosphorus 3.4 2.5 - 4.6 mg/dL  Glucose, capillary     Status: Abnormal   Collection Time: 09/07/17  8:20 PM  Result Value Ref Range   Glucose-Capillary 152 (H) 65 - 99 mg/dL  Glucose, capillary     Status: Abnormal   Collection Time: 09/07/17 11:39 PM  Result Value Ref Range   Glucose-Capillary 146 (H) 65 - 99 mg/dL  Glucose, capillary     Status: Abnormal   Collection Time: 09/08/17 12:54 AM  Result Value Ref Range   Glucose-Capillary 127 (H) 65 - 99 mg/dL  Glucose, capillary  Status: Abnormal   Collection Time: 09/08/17  3:21 AM  Result Value Ref Range   Glucose-Capillary 128 (H) 65 - 99 mg/dL  Magnesium     Status: None   Collection Time: 09/08/17  5:07 AM  Result Value Ref Range   Magnesium 2.4 1.7 - 2.4 mg/dL  Phosphorus     Status: None   Collection Time: 09/08/17  5:07 AM  Result Value Ref Range   Phosphorus 2.6 2.5 - 4.6 mg/dL  Valproic acid level     Status: None   Collection Time: 09/08/17  5:07 AM  Result Value Ref Range   Valproic Acid Lvl 81 50.0 - 100.0 ug/mL  Glucose, capillary     Status: Abnormal   Collection Time: 09/08/17  8:13 AM  Result Value Ref Range   Glucose-Capillary 134 (H) 65 - 99 mg/dL   Comment 1 Notify RN    Comment 2 Document in Chart   Glucose, capillary     Status: Abnormal   Collection Time: 09/08/17 12:19 PM  Result Value Ref Range   Glucose-Capillary 136 (H) 65 - 99 mg/dL   Comment 1 Notify RN    Comment 2 Document in Chart     Blood Alcohol level:  No results found for: [facility]  Metabolic Disorder Labs: Lab Results  Component Value Date   HGBA1C 8.4 (H) 08/26/2017   MPG 194.38  08/26/2017   Lab Results  Component Value Date   PROLACTIN 112.8 (H) 08/27/2017   Lab Results  Component Value Date   TRIG 177 (H) 09/02/2017     Psychiatric Specialty Exam: Physical Exam  Respiratory: He is in respiratory distress.  Psychiatric: His affect is blunt. His speech is slurred. He is slowed. He is noncommunicative.    Review of Systems  Psychiatric/Behavioral: Positive for depression and memory loss.    Blood pressure (!) 135/49, pulse 82, temperature 98.1 F (36.7 C), temperature source Oral, resp. rate 18, height 5\' 9"  (1.753 m), weight 99.2 kg (218 lb 9.6 oz), SpO2 94 %.Body mass index is 32.28 kg/m.  General Appearance: Disheveled  Eye Contact:  Fair  Speech:  Garbled  Volume:  Decreased  Mood:  Dysphoric  Affect:  Congruent  Thought Process:  NA  Orientation:  NA  Thought Content:  NA  Suicidal Thoughts:  No  Homicidal Thoughts:  No  Memory:  Immediate;   Poor Recent;   Poor Remote;   Poor  Judgement:  Poor  Insight:  Lacking  Psychomotor Activity:  Decreased  Concentration:  Concentration: Fair and Attention Span: Fair  Recall:  [employer] of Knowledge:  Poor  Language:  NA  Akathisia:  No  Handed:  Right  AIMS (if indicated):     Assets:  Resilience  ADL's:  Impaired  Cognition:  Impaired,  Mild  Sleep:       Treatment Plan Summary: Daily contact with patient to assess and evaluate symptoms and progress in treatment, Medication management and Plan. Patient continues to be gradually improving.  He is able to communicate a little bit better but his speech is still so difficult to understand it is hard for me to know just how clearly he is thinking.  His affect is dysphoric and he seems to indicate some apathy about his situation.   Depression -continue mirtazapine 15mg  soltabs -psych will continue to follow appreciate consult -denies SI   Disposition: Patient does not meet criteria for psychiatric inpatient admission. Supportive therapy  provided about ongoing stressors.  Jolene Schimke, MD 09/08/2017, 4:38 PM

## 2017-09-08 NOTE — Care Management (Signed)
CM has spoken with ltac reps from [facility] and as of 11/30, he met criteria.  If this is a viable discharge disposition, will need to pursue this option aggressively next business day 

## 2017-09-09 ENCOUNTER — Ambulatory Visit (HOSPITAL_COMMUNITY)
Admission: AD | Admit: 2017-09-09 | Discharge: 2017-09-09 | Disposition: A | Discharge status: Home / Self Care | Payer: Medicare Other | Source: Other Acute Inpatient Hospital | Attending: Pulmonary Disease | Admitting: Pulmonary Disease

## 2017-09-09 ENCOUNTER — Inpatient Hospital Stay: Payer: Medicare Other

## 2017-09-09 DIAGNOSIS — F329 Major depressive disorder, single episode, unspecified: Secondary | ICD-10-CM | POA: Insufficient documentation

## 2017-09-09 DIAGNOSIS — F0280 Dementia in other diseases classified elsewhere without behavioral disturbance: Secondary | ICD-10-CM

## 2017-09-09 LAB — CULTURE, BLOOD (ROUTINE X 2)
CULTURE: NO GROWTH
Culture: NO GROWTH
Special Requests: ADEQUATE
Special Requests: ADEQUATE

## 2017-09-09 LAB — CBC
HEMATOCRIT: 41.9 % (ref 40.0–52.0)
HEMOGLOBIN: 13.6 g/dL (ref 13.0–18.0)
MCH: 29.3 pg (ref 26.0–34.0)
MCHC: 32.5 g/dL (ref 32.0–36.0)
MCV: 90 fL (ref 80.0–100.0)
Platelets: 320 10*3/uL (ref 150–440)
RBC: 4.66 MIL/uL (ref 4.40–5.90)
RDW: 13.7 % (ref 11.5–14.5)
WBC: 13.7 10*3/uL — ABNORMAL HIGH (ref 3.8–10.6)

## 2017-09-09 LAB — BASIC METABOLIC PANEL
ANION GAP: 10 (ref 5–15)
BUN: 18 mg/dL (ref 6–20)
CO2: 23 mmol/L (ref 22–32)
Calcium: 8.2 mg/dL — ABNORMAL LOW (ref 8.9–10.3)
Chloride: 107 mmol/L (ref 101–111)
Creatinine, Ser: 0.86 mg/dL (ref 0.61–1.24)
GFR calc Af Amer: >60 mL/min (ref >60)
GLUCOSE: 151 mg/dL — AB (ref 65–99)
POTASSIUM: 3.9 mmol/L (ref 3.5–5.1)
Sodium: 140 mmol/L (ref 135–145)

## 2017-09-09 LAB — GLUCOSE, CAPILLARY
Glucose-Capillary: 138 mg/dL — ABNORMAL HIGH (ref 65–99)
Glucose-Capillary: 137 mg/dL — ABNORMAL HIGH (ref 65–99)

## 2017-09-09 LAB — VALPROIC ACID LEVEL: Valproic Acid Lvl: 44 ug/mL — ABNORMAL LOW (ref 50.0–100.0)

## 2017-09-09 MED ORDER — PANTOPRAZOLE SODIUM 40 MG PO TBEC: 40.0000 mg | DELAYED_RELEASE_TABLET | Freq: Every day | ORAL | Status: AC

## 2017-09-09 MED ORDER — ASPIRIN 81 MG PO CHEW: 81.0000 mg | CHEWABLE_TABLET | Freq: Every day | ORAL | Status: AC

## 2017-09-09 MED ORDER — VITAL 1.5 CAL PO LIQD: 1000.0000 mL | ORAL | Status: AC

## 2017-09-09 MED ORDER — LIDOCAINE VISCOUS 2 % MT SOLN
OROMUCOSAL | Status: AC
  Filled 2017-09-09: qty 15

## 2017-09-09 MED ORDER — DIVALPROEX SODIUM 500 MG PO DR TAB: 1500.0000 mg | DELAYED_RELEASE_TABLET | Freq: Two times a day (BID) | ORAL | 2 refills | Status: AC

## 2017-09-09 MED ORDER — MIRTAZAPINE 15 MG PO TBDP
15.0000 mg | ORAL_TABLET | Freq: Every day | ORAL | 2 refills | Status: AC
Start: 2017-09-09 — End: ?

## 2017-09-09 MED ORDER — AMOXICILLIN-POT CLAVULANATE 875-125 MG PO TABS: 1.0000 | ORAL_TABLET | Freq: Two times a day (BID) | ORAL | 0 refills | Status: AC

## 2017-09-09 MED ORDER — PRO-STAT SUGAR FREE PO LIQD: 30.0000 mL | Freq: Every day | ORAL | 0 refills | Status: AC

## 2017-09-09 MED ORDER — LIDOCAINE VISCOUS 2 % MT SOLN
2.0000 mL | Freq: Once | OROMUCOSAL | Status: DC
  Filled 2017-09-09: qty 15

## 2017-09-09 MED ORDER — LISINOPRIL 20 MG PO TABS: 20.0000 mg | ORAL_TABLET | Freq: Every day | ORAL | 2 refills | Status: AC

## 2017-09-09 NOTE — Consult Note (Signed)
[facility] Consult   Reason for Consult: This is a repeat consult for this 71 year old man with a history of new status epilepticus.  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 Seizure ([place]) [R56.9]   . Adjustment disorder with mixed anxiety and depressed mood [F43.23] 09/04/2017  . Dysthymia [F34.1] 09/04/2017  . Acute respiratory failure ([place]) [J96.00]   . Status epilepticus ([place]) [G40.901] 08/27/2017    Total Time spent with patient: 1 hour  Subjective:   Andrew Horton is a 71 y.o. male patient admitted with "I do not really know".  HPI: Patient interviewed chart reviewed.  Patient familiar to me from having seen him a couple times last week.  He was seen by the psychiatrist on call over the weekend.  Today I was asked to specifically reevaluate him in terms of capacity for decision making.  71 year old man who came into the hospital with new onset seizures possibly related to a benign brain mass.  Subsequently he has had extended time in the intensive care unit because of aspiration.  Patient currently is continuing to aspirate but has been pulling out his feeding tubes repeatedly.  On interview today the patient told me at first that he thought he was in a mental hospital.  He was unable to tell me any medical details about why he was in the hospital.  For most of the interview he was very focused on a suppose that lawsuit that he is engaged in.  He is insisting that he needs to leave the hospital as soon as possible because there are many things he has to do to deal with a lawsuit that he is involved in in [place].  I tried to get him to give me some details about what he thought he needed  to do however and he was not able to tell me anything specific.  I tried to explain to him about aspiration and how he is neurologically not able to swallow safely and that is why he has a feeding tube in.  Patient did not appear to understand at all.  His follow-up questions were not appropriate to the information.  He is not reporting being depressed.  Not reporting being suicidal.  Does not appear to be hallucinating or be frankly psychotic.  He does appear to be confused and be having significant memory problems.  Social history: Patient evidently had been living alone prior to presenting to the hospital.  As far as can be told his only family is in [place] and he does not get along with him.  Medical history: New onset seizure disorder.  Respiratory problems related to aspiration  Substance abuse history: None known  Past Psychiatric History: Patient has a past history of having consulted his primary care doctor about depression but no history of hospitalizations no history of suicide attempts.  Had never been on psychiatric medicine in the past.  Initial consult last week concerned a belief that he might have suicidal ideation.  I did not believe at that time that the patient was suicidal.  I do not think he is suicidal currently either.  Risk to Self: Is patient at risk for suicide?: No Risk to Others:   Prior Inpatient Therapy:   Prior Outpatient Therapy:  Past Medical History:  Past Medical History:  Diagnosis Date  . Brain tumor (benign) ([place])    Family History: History reviewed. No pertinent family history. Family Psychiatric  History: None is known. Social History:  Social History   Substance and Sexual Activity  Alcohol Use No  . Frequency: Never     Social History   Substance and Sexual Activity  Drug Use No    Social History   Socioeconomic History  . Marital status: Single    Spouse name: None  . Number of children: None  . Years of education: None  .  Highest education level: None  Social Needs  . Financial resource strain: None  . Food insecurity - worry: None  . Food insecurity - inability: None  . Transportation needs - medical: None  . Transportation needs - non-medical: None  Occupational History  . None  Tobacco Use  . Smoking status: Never Smoker  . Smokeless tobacco: Never Used  Substance and Sexual Activity  . Alcohol use: No    Frequency: Never  . Drug use: No  . Sexual activity: None  Other Topics Concern  . None  Social History Narrative  . None   Additional Social History:    Allergies:   Allergies  Allergen Reactions  . Sulfa Antibiotics Hives    Labs:  Results for orders placed or performed during the hospital encounter of 08/26/17 (from the past 48 hour(s))  Magnesium     Status: None   Collection Time: 09/07/17  4:53 PM  Result Value Ref Range   Magnesium 2.4 1.7 - 2.4 mg/dL  Phosphorus     Status: None   Collection Time: 09/07/17  4:53 PM  Result Value Ref Range   Phosphorus 3.4 2.5 - 4.6 mg/dL  Glucose, capillary     Status: Abnormal   Collection Time: 09/07/17  8:20 PM  Result Value Ref Range   Glucose-Capillary 152 (H) 65 - 99 mg/dL  Glucose, capillary     Status: Abnormal   Collection Time: 09/07/17 11:39 PM  Result Value Ref Range   Glucose-Capillary 146 (H) 65 - 99 mg/dL  Glucose, capillary     Status: Abnormal   Collection Time: 09/08/17 12:54 AM  Result Value Ref Range   Glucose-Capillary 127 (H) 65 - 99 mg/dL  Glucose, capillary     Status: Abnormal   Collection Time: 09/08/17  3:21 AM  Result Value Ref Range   Glucose-Capillary 128 (H) 65 - 99 mg/dL  Magnesium     Status: None   Collection Time: 09/08/17  5:07 AM  Result Value Ref Range   Magnesium 2.4 1.7 - 2.4 mg/dL  Phosphorus     Status: None   Collection Time: 09/08/17  5:07 AM  Result Value Ref Range   Phosphorus 2.6 2.5 - 4.6 mg/dL  Valproic acid level     Status: None   Collection Time: 09/08/17  5:07 AM  Result  Value Ref Range   Valproic Acid Lvl 81 50.0 - 100.0 ug/mL  Glucose, capillary     Status: Abnormal   Collection Time: 09/08/17  8:13 AM  Result Value Ref Range   Glucose-Capillary 134 (H) 65 - 99 mg/dL   Comment 1 Notify RN    Comment 2 Document in Chart   Glucose, capillary     Status: Abnormal   Collection Time: 09/08/17 12:19 PM  Result Value Ref Range   Glucose-Capillary 136 (H) 65 - 99 mg/dL   Comment 1 Notify  RN    Comment 2 Document in Chart   Glucose, capillary     Status: Abnormal   Collection Time: 09/08/17  5:08 PM  Result Value Ref Range   Glucose-Capillary 149 (H) 65 - 99 mg/dL   Comment 1 Notify RN    Comment 2 Document in Chart   Glucose, capillary     Status: Abnormal   Collection Time: 09/08/17  9:00 PM  Result Value Ref Range   Glucose-Capillary 132 (H) 65 - 99 mg/dL   Comment 1 Notify RN   Basic metabolic panel     Status: Abnormal   Collection Time: 09/09/17  6:18 AM  Result Value Ref Range   Sodium 140 135 - 145 mmol/L   Potassium 3.9 3.5 - 5.1 mmol/L   Chloride 107 101 - 111 mmol/L   CO2 23 22 - 32 mmol/L   Glucose, Bld 151 (H) 65 - 99 mg/dL   BUN 18 6 - 20 mg/dL   Creatinine, Ser 0.86 0.61 - 1.24 mg/dL   Calcium 8.2 (L) 8.9 - 10.3 mg/dL   GFR calc non Af Amer >60 >60 mL/min   GFR calc Af Amer >60 >60 mL/min    Comment: (NOTE) The eGFR has been calculated using the CKD EPI equation. This calculation has not been validated in all clinical situations. eGFR's persistently <60 mL/min signify possible Chronic Kidney Disease.    Anion gap 10 5 - 15  CBC     Status: Abnormal   Collection Time: 09/09/17  6:18 AM  Result Value Ref Range   WBC 13.7 (H) 3.8 - 10.6 K/uL   RBC 4.66 4.40 - 5.90 MIL/uL   Hemoglobin 13.6 13.0 - 18.0 g/dL   HCT 41.9 40.0 - 52.0 %   MCV 90.0 80.0 - 100.0 fL   MCH 29.3 26.0 - 34.0 pg   MCHC 32.5 32.0 - 36.0 g/dL   RDW 13.7 11.5 - 14.5 %   Platelets 320 150 - 440 K/uL  Valproic acid level     Status: Abnormal   Collection  Time: 09/09/17  6:18 AM  Result Value Ref Range   Valproic Acid Lvl 44 (L) 50.0 - 100.0 ug/mL  Glucose, capillary     Status: Abnormal   Collection Time: 09/09/17  8:28 AM  Result Value Ref Range   Glucose-Capillary 138 (H) 65 - 99 mg/dL   Comment 1 Notify RN    Comment 2 Document in Chart   Glucose, capillary     Status: Abnormal   Collection Time: 09/09/17 11:50 AM  Result Value Ref Range   Glucose-Capillary 137 (H) 65 - 99 mg/dL   Comment 1 Notify RN    Comment 2 Document in Chart     Current Facility-Administered Medications  Medication Dose Route Frequency Provider Last Rate Last Dose  . acetaminophen (TYLENOL) tablet 650 mg  650 mg Oral Q6H PRN Harrie Foreman, MD       Or  . acetaminophen (TYLENOL) suppository 650 mg  650 mg Rectal Q6H PRN Harrie Foreman, MD   650 mg at 09/05/17 2302  . Ampicillin-Sulbactam (UNASYN) 3 g in sodium chloride 0.9 % 100 mL IVPB  3 g Intravenous Q6H Nettie Elm, MD   Stopped at 09/09/17 1023  . aspirin chewable tablet 81 mg  81 mg Per Tube Daily Nettie Elm, MD      . atorvastatin (LIPITOR) tablet 80 mg  80 mg Per Tube q1800 Nettie Elm, MD   80 mg at  09/06/17 1731  . bromocriptine (PARLODEL) tablet 12.5 mg  12.5 mg Per Tube Daily Nettie Elm, MD      . chlorhexidine (PERIDEX) 0.12 % solution 15 mL  15 mL Mouth Rinse BID Fritzi Mandes, MD   15 mL at 09/09/17 0907  . enoxaparin (LOVENOX) injection 40 mg  40 mg Subcutaneous Q24H Fritzi Mandes, MD   40 mg at 09/09/17 0905  . feeding supplement (PRO-STAT SUGAR FREE 64) liquid 30 mL  30 mL Per Tube Daily Nettie Elm, MD   30 mL at 09/06/17 1731  . feeding supplement (VITAL 1.5 CAL) liquid 1,000 mL  1,000 mL Per Tube Continuous Nettie Elm, MD   Stopped at 09/07/17 0118  . hydrALAZINE (APRESOLINE) injection 10 mg  10 mg Intravenous Q2H PRN Flora Lipps, MD   10 mg at 09/04/17 0055  . insulin aspart (novoLOG) injection 0-5 Units  0-5 Units Subcutaneous QHS Gladstone Lighter, MD      . insulin  aspart (novoLOG) injection 0-9 Units  0-9 Units Subcutaneous TID WC Gladstone Lighter, MD   1 Units at 09/09/17 0905  . lidocaine (XYLOCAINE) 2 % viscous mouth solution 2 mL  2 mL Mouth/Throat Once Gladstone Lighter, MD      . lidocaine (XYLOCAINE) 2 % viscous mouth solution           . lisinopril (PRINIVIL,ZESTRIL) tablet 20 mg  20 mg Per Tube Daily Nettie Elm, MD      . LORazepam (ATIVAN) injection 2 mg  2 mg Intravenous PRN Wilhelmina Mcardle, MD   2 mg at 09/04/17 0228  . MEDLINE mouth rinse  15 mL Mouth Rinse q12n4p Fritzi Mandes, MD   15 mL at 09/07/17 1800  . metoprolol tartrate (LOPRESSOR) injection 5 mg  5 mg Intravenous Q6H PRN Awilda Bill, NP      . mirtazapine (REMERON SOL-TAB) disintegrating tablet 15 mg  15 mg Per NG tube QHS Clapacs, Madie Reno, MD   15 mg at 09/08/17 2308  . ondansetron (ZOFRAN) injection 4 mg  4 mg Intravenous Q6H PRN Harrie Foreman, MD      . pantoprazole (PROTONIX) injection 40 mg  40 mg Intravenous Q24H Nettie Elm, MD   40 mg at 09/09/17 0905  . valproate (DEPACON) 1,500 mg in dextrose 5 % 50 mL IVPB  1,500 mg Intravenous Q12H Alexis Goodell, MD   Stopped at 09/09/17 [phone]    Musculoskeletal: Strength & Muscle Tone: decreased and atrophy Gait & Station: unsteady Patient leans: N/A  Psychiatric Specialty Exam: Physical Exam  Nursing note and vitals reviewed. Constitutional: He appears well-developed and well-nourished.  HENT:  Head: Normocephalic and atraumatic.  Eyes: Conjunctivae are normal. Pupils are equal, round, and reactive to light.  Neck: Normal range of motion.  Cardiovascular: Regular rhythm and normal heart sounds.  Respiratory: He is in respiratory distress.  GI: Soft.  Musculoskeletal: Normal range of motion.  Neurological: He is alert.  Skin: Skin is warm and dry.  Psychiatric: His affect is blunt. His speech is delayed, tangential and slurred. He is not agitated, not aggressive, not hyperactive and not combative. Thought  content is paranoid. Cognition and memory are impaired. He expresses impulsivity. He expresses no homicidal and no suicidal ideation. He exhibits abnormal recent memory.    Review of Systems  Constitutional: Negative.   HENT: Negative.   Eyes: Negative.   Respiratory: Negative.   Cardiovascular: Negative.   Gastrointestinal: Negative.   Musculoskeletal: Negative.   Skin: Negative.  Neurological: Negative.   Psychiatric/Behavioral: Positive for memory loss. Negative for depression, hallucinations, substance abuse and suicidal ideas. The patient is nervous/anxious. The patient does not have insomnia.     Blood pressure 132/65, pulse 86, temperature (!) 97.5 F (36.4 C), temperature source Oral, resp. rate 20, height _0  (1.753 m), weight 99.5 kg (219 lb 4.8 oz), SpO2 98 %.Body mass index is 32.38 kg/m.  General Appearance: Disheveled  Eye Contact:  Fair  Speech:  Garbled, Slow and Speech remains difficult to understand because of his problems with respiration  Volume:  Decreased  Mood:  Anxious  Affect:  Congruent  Thought Process:  Disorganized  Orientation:  Negative  Thought Content:  Illogical and Tangential  Suicidal Thoughts:  No  Homicidal Thoughts:  No  Memory:  Immediate;   Fair Recent;   Poor Remote;   Fair  Judgement:  Poor  Insight:  Lacking  Psychomotor Activity:  Decreased  Concentration:  Concentration: Poor  Recall:  Poor  Fund of Knowledge:  Fair  Language:  Fair  Akathisia:  No  Handed:  Right  AIMS (if indicated):     Assets:  Desire for Improvement Resilience  ADL's:  Impaired  Cognition:  Impaired,  Mild  Sleep:        Treatment Plan Summary: Daily contact with patient to assess and evaluate symptoms and progress in treatment, Medication management and Plan This is a 71 year old man who is reevaluated today for concerns about capacity.  The treatment plan at this point would be for discharge from the hospital to a relatively higher level care  facility for rehabilitation and longer term treatment.  The patient's understanding of his medical condition is very poor and he shows limited ability to learn and very limited interest in his medical condition.  He remains focused on a suppose of the lawsuit but is unable to even describe his concerns and understandable detail.  I think that he is clearly still having cognitive problems.  Not known how much these will ultimately improve but at this point he does not appear to have the capacity to make reasonable informed decisions about his own care.  His repeated efforts to pull out his feeding tube are a good demonstration of that.  I reviewed this with care management.  We did start him on a modest amount of mirtazapine for depression last week and I would certainly recommend continuing that.  I do not think that that is making anything worse and may be ultimately of some benefit with his mood and anxiety.  I will follow up if he remains in the hospital.  Disposition: Patient does not meet criteria for psychiatric inpatient admission. Supportive therapy provided about ongoing stressors.  Alethia Berthold, MD 09/09/2017 4:47 PM

## 2017-09-09 NOTE — Progress Notes (Signed)
OT Cancellation Note  Patient Details Name: Andrew Horton MRN: [id number] DOB: 08-19-1946   Cancelled Treatment:    Reason Eval/Treat Not Completed: Fatigue/lethargy limiting ability to participate. Upon attempt, pt very lethargic opened eyes once with verbal cues and tactile cues, and did not open them again despite additional cues and did not respond to OT. Will re-attempt at later date/time as pt is less lethargic and able to participate.   Jeni Salles, MPH, MS, OTR/L ascom [phone] 09/09/17, 9:40 AM

## 2017-09-09 NOTE — Progress Notes (Addendum)
[facility] [place] NAME: Andrew Horton    MR#:  [id number]  DATE OF BIRTH:  1946/06/28  SUBJECTIVE:  CHIEF COMPLAINT:   Chief Complaint  Patient presents with  . Seizures   - Getting more alert and stronger each day. Trying to mouth words and communicate -Pending speech therapy reevaluation today  REVIEW OF SYSTEMS:  Review of Systems  Constitutional: Positive for malaise/fatigue. Negative for chills and fever.  Respiratory: Positive for cough. Negative for shortness of breath and wheezing.   Cardiovascular: Negative for chest pain and palpitations.  Gastrointestinal: Negative for abdominal pain, constipation, diarrhea, nausea and vomiting.  Genitourinary: Negative for dysuria.  Musculoskeletal: Positive for myalgias.  Neurological: Positive for weakness. Negative for dizziness, speech change, focal weakness, seizures and headaches.  Psychiatric/Behavioral: Negative for depression.    DRUG ALLERGIES:   Allergies  Allergen Reactions  . Sulfa Antibiotics Hives    VITALS:  Blood pressure 132/65, pulse 87, temperature (!) 97.5 F (36.4 C), temperature source Oral, resp. rate 20, height 5\' 9"  (1.753 m), weight 99.5 kg (219 lb 4.8 oz), SpO2 96 %.  PHYSICAL EXAMINATION:  Physical Exam  GENERAL:  71 y.o.-year-old ill appearing patient lying in the bed with no acute distress.  EYES: Pupils equal, round, reactive to light and accommodation. No scleral icterus. Extraocular muscles intact.  HEENT: Head atraumatic, normocephalic. Oropharynx and nasopharynx clear.  NECK:  Supple, no jugular venous distention. No thyroid enlargement, no tenderness.  LUNGS: Normal breath sounds bilaterally, no wheezing, rales or crepitation. Occasional rhonchi heard. No use of accessory muscles of respiration. Decreased bibasilar breath sounds  Cough reflexes a little stronger now CARDIOVASCULAR: S1, S2 normal. No murmurs, rubs, or gallops.  ABDOMEN:  Soft, nontender, nondistended. Bowel sounds present. No organomegaly or mass.  EXTREMITIES: No pedal edema, cyanosis, or clubbing.  NEUROLOGIC: Following simple commands at this time. Able to move all extremities in bed, 5/5 in both lower extremities, however much weaker in the upper extremities at 3/5. PSYCHIATRIC: The patient is alert and oriented x 2 SKIN: No obvious rash, lesion, or ulcer.    LABORATORY PANEL:   CBC Recent Labs  Lab 09/09/17 0618  WBC 13.7*  HGB 13.6  HCT 41.9  PLT 320   ------------------------------------------------------------------------------------------------------------------  Chemistries  Recent Labs  Lab 09/03/17 0403  09/08/17 0507 09/09/17 0618  NA 140   < >  --  140  K 3.3*   < >  --  3.9  CL 105   < >  --  107  CO2 25   < >  --  23  GLUCOSE 225*   < >  --  151*  BUN 17   < >  --  18  CREATININE 0.86   < >  --  0.86  CALCIUM 8.1*   < >  --  8.2*  MG 2.1   < > 2.4  --   AST 40  --   --   --   ALT 38  --   --   --   ALKPHOS 57  --   --   --   BILITOT 0.4  --   --   --    < > = values in this interval not displayed.   ------------------------------------------------------------------------------------------------------------------  Cardiac Enzymes No results for input(s): TROPONINI in the last 168 hours. ------------------------------------------------------------------------------------------------------------------  RADIOLOGY:  No results found.  EKG:   Orders placed or performed during the hospital encounter  of 08/26/17  . EKG 12-Lead  . EKG 12-Lead  . EKG 12-Lead  . EKG 12-Lead    ASSESSMENT AND PLAN:   71 year old male with no significant family here was brought into the hospital secondary to unresponsiveness and was noted to be in status epilepticus  #1 status epilepticus-on admission requiring intubation. -New onset seizures causing acute encephalopathy -Appreciate neurology consult. 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 -Patient was lethargic with Keppra and so changed to Depakote.  -Appreciate neurology consult -More alert and oriented to self at this time  #2 acute respiratory failure-possible aspiration. Extubated currently , on nasal cannula now -Have and has a weak cough and trouble clearing Secretions-but improving slowly -Encouraged incentive spirometry, physical activity and continue suctioning as needed -on unasyn for aspiration pna and receiving Lasix as needed-stop Antibiotics after 10 days -Failed swallow study on 09/06/2017. 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 Appreciate psych consult. on Remeron.    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 -Will need aggressive physical therapy  #7 DVT prophylaxis-on Lovenox    Physical therapy recommended LTAC    All the records are reviewed and case discussed with Care Management/Social Workerr. Management plans discussed with the patient, family and they are in agreement.  CODE STATUS: Full Code  TOTAL TIME TAKING CARE OF THIS PATIENT: 38 minutes.   POSSIBLE D/C to LTAC today or tomorrow, DEPENDING ON CLINICAL CONDITION.   Gladstone Lighter M.D on 09/09/2017 at 10:33 AM  Between 7am to 6pm - Pager - [phone]  After 6pm go to [email or web address] - password [email or web address] [facility]  Office  [phone]  CC: Primary care physician; System, Pcp Not In

## 2017-09-09 NOTE — Progress Notes (Signed)
SLP Cancellation Note  Patient Details Name: Andrew Horton MRN: [id number] DOB: July 29, 1946   Cancelled treatment:       Reason Eval/Treat Not Completed: Patient not medically ready;Fatigue/lethargy limiting ability to participate(pt continues to be drowsy this PM). Per CM, pt is also discharging to LTAC this PM and Psychiatry is meeting w/ pt this afternoon for ongoing assessment.  Discussed pt's MBSS results w/ NSG and MD which indicated pt's high risk for aspiration(also indicated pt experiences silent aspiration) d/t pharyngeal phase dysphagia and the risk for negative sequelae of aspiration including pneumonia. Of note, MD agreed and had received a dobhoff for nutritional support which he has since pulled out. Discussed that pt is not ready for objective reassessment of his swallowing today d/t overall medical and Cognitive/Mental status' and recommended for pt to have his dobhoff replaced for nutritional support. SLP stated that there needs to be much more medical and Mental status improvement to support an improved swallow function. MD agreed.  ST services will f/u if pt does not discharge to [facility] today. NSG agreed.    Orinda Kenner, MS, CCC-SLP Watson,Katherine 09/09/2017, 4:40 PM

## 2017-09-09 NOTE — Progress Notes (Signed)
Failed swallow evaluation again. Silent aspiration. Chest x-ray with worsening right-sided infiltrates. -We'll need tube feeds for almost 2-3 weeks before swallow study needs to be repeated. Requested IR to place fluoroscopy guided Dobbhoff tube. -Patient might be a candidate for long-term acute care

## 2017-09-09 NOTE — Progress Notes (Signed)
Physical Therapy Treatment Patient Details Name: Andrew Horton MRN: [id number] DOB: 09/07/1946 Today's Date: 09/09/2017    History of Present Illness 71yo male with PMHx or prolactinoma, no prior seizure history, admitted with status epilepticus and acute encephalopathy. Intubated in ED on 11/19, extubated 11/20 AM. Persistent encephalopathy requiring re-intubation 11/21 early AM and self extubated on 11/24. 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 Seen for OT and PT evaluations on 11/27.    PT Comments    Pt presents with deficits in strength, transfers, mobility, gait, balance, and activity tolerance.  Pt remains lethargic and only rarely responded to questions with verbal answers but was able to follow simple commands with increased time for processing.  Pt continues to require +2 assistance with bed mobility tasks along with extensive verbal and tactile cues for sequencing.  Pt able to perform STS transfer x 3 from elevated EOB with Mod A and mod verbal and tactile cues for sequencing.  Pt able to take one small step forward at EOB but unsteady with +2 assist required for safety.  Pt sits impulsively with poor eccentric control and is a high fall risk. Pt will benefit from PT services in a SNF or LTACH setting upon discharge to safely address above deficits for decreased caregiver assistance and eventual return to PLOF.       Follow Up Recommendations  LTACH     Equipment Recommendations  Other (comment)(TBD at next venue of care)    Recommendations for Other Services       Precautions / Restrictions Precautions Precautions: Fall Restrictions Weight Bearing Restrictions: No    Mobility  Bed Mobility Overal bed mobility: Needs Assistance Bed Mobility: Rolling;Supine to Sit;Sit to Supine Rolling: +2 for physical assistance;Max assist   Supine to sit: +2 for physical assistance;Max  assist Sit to supine: Max assist;+2 for physical assistance   General bed mobility comments: Extensive cues and encouragement required  Transfers Overall transfer level: Needs assistance Equipment used: Rolling walker (2 wheeled) Transfers: Sit to/from Stand Sit to Stand: +2 physical assistance;+2 safety/equipment;From elevated surface;Mod assist         General transfer comment: Pt able to perform STS transfer x 3 from elevated EOB with Mod A and mod verbal and tactile cues for sequencing.  Pt sits impulsively with poor eccentric control.    Ambulation/Gait Ambulation/Gait assistance: Max assist;+2 physical assistance;+2 safety/equipment Ambulation Distance (Feet): 1 Feet Assistive device: Rolling walker (2 wheeled)     Gait velocity interpretation: <1.8 ft/sec, indicative of risk for recurrent falls General Gait Details: Pt took one step at EOB but buckled and returned to sitting at EOB with +2 A required to control descent    Stairs            Wheelchair Mobility    Modified Rankin (Stroke Patients Only)       Balance Overall balance assessment: Needs assistance Sitting-balance support: Feet unsupported;Feet supported;Bilateral upper extremity supported Sitting balance-Leahy Scale: Poor Sitting balance - Comments: Pt's overall sitting balance progressing but pt continues to require occasional assist as well as extensive cues to maintain sitting balance. Postural control: Right lateral lean Standing balance support: Bilateral upper extremity supported Standing balance-Leahy Scale: Poor Standing balance comment: Pt able to maintain static standing balance only briefly until requires assist to prevent posterior LOB or returns to sitting.  Cognition Arousal/Alertness: Lethargic Behavior During Therapy: Flat affect Overall Cognitive Status: No family/caregiver present to determine baseline cognitive functioning                          Following Commands: Follows one step commands with increased time              Exercises Total Joint Exercises Ankle Circles/Pumps: Strengthening;Both;10 reps Quad Sets: Strengthening;Both;10 reps Heel Slides: Both;10 reps;AAROM Hip ABduction/ADduction: AAROM;Both;10 reps Straight Leg Raises: AAROM;Both;10 reps Other Exercises Other Exercises: Resisted supine leg press x 10 L and R  Other Exercises: Static sitting at EOB for core strength and balance Other Exercises: Pt able to complete two mini squats in standing but then quickly returned to sitting with poor eccentric control.     General Comments        Pertinent Vitals/Pain Pain Assessment: No/denies pain    Home Living                      Prior Function            PT Goals (current goals can now be found in the care plan section) Progress towards PT goals: Progressing toward goals    Frequency    Min 2X/week      PT Plan Current plan remains appropriate    Co-evaluation              AM-PAC PT "6 Clicks" Daily Activity  Outcome Measure                   End of Session Equipment Utilized During Treatment: Gait belt Activity Tolerance: Patient limited by fatigue;Patient limited by lethargy Patient left: in bed;with call bell/phone within reach;with bed alarm set Nurse Communication: Mobility status PT Visit Diagnosis: Muscle weakness (generalized) (M62.81);Dizziness and giddiness (R42);Difficulty in walking, not elsewhere classified (R26.2)     Time: 1324-4010 PT Time Calculation (min) (ACUTE ONLY): 23 min  Charges:  $Therapeutic Exercise: 8-22 mins $Therapeutic Activity: 8-22 mins                    G Codes:       DRoyetta Asal PT, DPT 09/09/17, 11:17 AM

## 2017-09-09 NOTE — Care Management (Signed)
[facility] is no longer able to accept patient.  [facility] has accepted patient and can accept patient today.  Informed attending.  CM spoke with patient about transfer and he says "yes." It does not appear that psych has addressed capacity- informed attending.  CM spoke with patient's friend Jonna Munro that patient has been accepted by [facility] and he is agreeable to this transfer.  Patient pulled out his nasogastric tube and now will have dob hoff inserted.

## 2017-09-09 NOTE — Progress Notes (Signed)
Pt refused Dubhoff tube. He is being discharged to [facility] today. Pt prepared for transport.  [facility] called and transport arranged. Report called to Shalom at [place]. Informed pt's friend Annie Main of patient's transport.

## 2017-09-09 NOTE — Care Management (Signed)
Provided contact information to Marko Stai at [facility]

## 2017-09-09 NOTE — Discharge Summary (Addendum)
[facility] [place] NAME: Andrew Horton    MR#:  [id number]  DATE OF BIRTH:  1946-07-05  DATE OF ADMISSION:  08/26/2017   ADMITTING PHYSICIAN: Harrie Foreman, MD  DATE OF DISCHARGE:  09/09/17  PRIMARY CARE PHYSICIAN: System, Pcp Not In   ADMISSION DIAGNOSIS:   Status epilepticus ([place]) [G40.901] Seizure ([place]) [R56.9] Hyperglycemia [R73.9]  DISCHARGE DIAGNOSIS:   Principal Problem:   Adjustment disorder with mixed anxiety and depressed mood Active Problems:   Status epilepticus ([place])   Acute respiratory failure ([facility])   Dysthymia   Seizure ([place])   SECONDARY DIAGNOSIS:   Past Medical History:  Diagnosis Date  . Brain tumor (benign) [facility])     HOSPITAL COURSE:   71 year old male with no significant family here was brought into the hospital secondary to unresponsiveness and was noted to be in status epilepticus  #1 status epilepticus-on admission requiring intubation. -New onset seizures causing acute encephalopathy -Appreciate neurology consult. 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 -Patient was lethargic with Keppra and so changed to Depakote.  -Appreciate neurology consult -More alert and oriented to self at this time  #2 acute respiratory failure-possible aspiration. Extubated currently , on nasal cannula now -Have and has a weak cough and trouble clearing Secretions- which is slowly improving -Encouraged incentive spirometry, physical activity and continue suctioning as needed -on unasyn for aspiration pna and receiving Lasix as needed-changed her Augmentin at discharge --Failed swallow study on 09/06/2017. Pulled out his dobhoff tube, NPO for now -Repeat speech evaluation still recommending nothing by mouth as patient is a silent aspirator. IR to place Dobbhoff tube today and continue tube  feeds at least for 2-3 weeks before swallow study can be repeated. -Chest x-ray today showing worsening right lower lobe infiltrate, secondary to aspiration of his own secretions- - patient agitated during dobhoff placement and so procedure aborted. Can start TPN or retry [facility] tube at [facility]   #3 apathy and depression-relating to his current situation. Appreciate psych consult. on Remeron.   Patient is deemed incompetent to make his own medical decisions at this time by psychiatry.  #4 diabetes mellitus-with hyperglycemia,  -Appreciate diabetes coordinator's input -Sugars are improved, NPO now, so  only on sliding scale insulin in the hospital -Will be discharged on metformin as outpatient. 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 -Will need aggressive physical therapy    Physical therapy -will benefit from long-term acute care facility   DISCHARGE CONDITIONS:   Guarded  CONSULTS OBTAINED:   Treatment Team:  Alexis Goodell, MD Clapacs, Madie Reno, MD  DRUG ALLERGIES:   Allergies  Allergen Reactions  . Sulfa Antibiotics Hives   DISCHARGE MEDICATIONS:   Allergies as of 09/09/2017      Reactions   Sulfa Antibiotics Hives      Medication List    STOP taking these medications   clopidogrel 75 MG tablet Commonly known as:  PLAVIX   linagliptin 5 MG Tabs tablet Commonly known as:  TRADJENTA     TAKE these medications   amoxicillin-clavulanate 875-125 MG tablet Commonly known as:  AUGMENTIN Take 1 tablet by mouth 2 (two) times daily for 14 days.   aspirin 81 MG chewable tablet Place 1 tablet (81 mg total) into feeding tube daily. Start taking  on:  09/10/2017   atorvastatin 80 MG tablet Commonly known as:  LIPITOR Take 80 mg daily by mouth.   bromocriptine 2.5 MG tablet Commonly known as:  PARLODEL Take 12.5 mg daily by mouth.    divalproex 500 MG DR tablet Commonly known as:  DEPAKOTE Take 3 tablets (1,500 mg total) by mouth 2 (two) times daily.   feeding supplement (PRO-STAT SUGAR FREE 64) Liqd Place 30 mLs into feeding tube daily. Start taking on:  09/10/2017   feeding supplement (VITAL 1.5 CAL) Liqd Place 1,000 mLs into feeding tube continuous.   lisinopril 20 MG tablet Commonly known as:  PRINIVIL,ZESTRIL Take 1 tablet (20 mg total) by mouth daily. What changed:    medication strength  how much to take   metFORMIN 500 MG 24 hr tablet Commonly known as:  GLUCOPHAGE-XR Take 1,000 mg 2 (two) times daily by mouth.   metoprolol succinate 25 MG 24 hr tablet Commonly known as:  TOPROL-XL Take 25 mg daily by mouth.   mirtazapine 15 MG disintegrating tablet Commonly known as:  REMERON SOL-TAB Take 1 tablet (15 mg total) by mouth at bedtime.   nitroGLYCERIN 0.4 MG SL tablet Commonly known as:  NITROSTAT Place 0.4 mg every 5 (five) minutes as needed under the tongue for chest pain.   pantoprazole 40 MG tablet Commonly known as:  PROTONIX Take 1 tablet (40 mg total) by mouth daily.        DISCHARGE INSTRUCTIONS:   1. PCP f/u in 1-2 weeks   DIET:   Tube feeds  ACTIVITY:   Activity as tolerated  OXYGEN:   Home Oxygen: Yes.    Oxygen Delivery: 2 liters/min via Patient connected to nasal cannula oxygen  DISCHARGE LOCATION:   LTAC   If you experience worsening of your admission symptoms, develop shortness of breath, life threatening emergency, suicidal or homicidal thoughts you must seek medical attention immediately by calling 911 or calling your MD immediately  if symptoms less severe.  You Must read complete instructions/literature along with all the possible adverse reactions/side effects for all the Medicines you take and that have been prescribed to you. Take any new Medicines after you have completely understood and accpet all the possible adverse reactions/side effects.    Please note  You were cared for by a hospitalist during your hospital stay. If you have any questions about your discharge medications or the care you received while you were in the hospital after you are discharged, you can call the unit and asked to speak with the hospitalist on call if the hospitalist that took care of you is not available. Once you are discharged, your primary care physician will handle any further medical issues. Please note that NO REFILLS for any discharge medications will be authorized once you are discharged, as it is imperative that you return to your primary care physician (or establish a relationship with a primary care physician if you do not have one) for your aftercare needs so that they can reassess your need for medications and monitor your lab values.    On the day of Discharge:  VITAL SIGNS:   Blood pressure 132/65, pulse 86, temperature (!) 97.5 F (36.4 C), temperature source Oral, resp. rate 20, height 5\' 9"  (1.753 m), weight 99.5 kg (219 lb 4.8 oz), SpO2 98 %.  PHYSICAL EXAMINATION:    GENERAL:  71 y.o.-year-old ill appearing patient lying in the bed with no acute distress.  EYES: Pupils equal, round, reactive to light and  accommodation. No scleral icterus. Extraocular muscles intact.  HEENT: Head atraumatic, normocephalic. Oropharynx and nasopharynx clear.  NECK:  Supple, no jugular venous distention. No thyroid enlargement, no tenderness.  LUNGS: Normal breath sounds bilaterally, no wheezing, rales or crepitation. Occasional rhonchi heard. No use of accessory muscles of respiration. Decreased bibasilar breath sounds  Cough reflexes a little stronger now CARDIOVASCULAR: S1, S2 normal. No murmurs, rubs, or gallops.  ABDOMEN: Soft, nontender, nondistended. Bowel sounds present. No organomegaly or mass.  EXTREMITIES: No pedal edema, cyanosis, or clubbing.  NEUROLOGIC: Following simple commands at this time. Able to move all extremities in bed, 5/5 in  both lower extremities, however much weaker in the upper extremities at 3/5. PSYCHIATRIC: The patient is alert and oriented x 2 SKIN: No obvious rash, lesion, or ulcer.     DATA REVIEW:   CBC Recent Labs  Lab 09/09/17 0618  WBC 13.7*  HGB 13.6  HCT 41.9  PLT 320    Chemistries  Recent Labs  Lab 09/03/17 0403  09/08/17 0507 09/09/17 0618  NA 140   < >  --  140  K 3.3*   < >  --  3.9  CL 105   < >  --  107  CO2 25   < >  --  23  GLUCOSE 225*   < >  --  151*  BUN 17   < >  --  18  CREATININE 0.86   < >  --  0.86  CALCIUM 8.1*   < >  --  8.2*  MG 2.1   < > 2.4  --   AST 40  --   --   --   ALT 38  --   --   --   ALKPHOS 57  --   --   --   BILITOT 0.4  --   --   --    < > = values in this interval not displayed.     Microbiology Results  Results for orders placed or performed during the hospital encounter of 08/26/17  MRSA PCR Screening     Status: None   Collection Time: 08/27/17  3:24 AM  Result Value Ref Range Status   MRSA by PCR NEGATIVE NEGATIVE Final    Comment:        The GeneXpert MRSA Assay (FDA approved for NASAL specimens only), is one component of a comprehensive MRSA colonization surveillance program. It is not intended to diagnose MRSA infection nor to guide or monitor treatment for MRSA infections.   Culture, respiratory (NON-Expectorated)     Status: None   Collection Time: 09/01/17 12:30 AM  Result Value Ref Range Status   Specimen Description TRACHEAL ASPIRATE  Final   Special Requests NONE  Final   Gram Stain   Final    ABUNDANT WBC PRESENT, PREDOMINANTLY PMN RARE SQUAMOUS EPITHELIAL CELLS PRESENT ABUNDANT GRAM POSITIVE RODS FEW GRAM NEGATIVE RODS    Culture   Final    Consistent with normal respiratory flora. Performed at [facility], [place] [address] [place], [place] [address]    Report Status 09/03/2017 FINAL  Final  Urine Culture     Status: None   Collection Time: 09/01/17 12:39 AM  Result Value Ref Range Status    Specimen Description URINE, RANDOM  Final   Special Requests NONE  Final   Culture   Final    NO GROWTH Performed at [facility], [place],  [place] [address]    Report Status 09/02/2017 FINAL  Final  Culture, blood (Routine X 2) w Reflex to ID Panel     Status: None   Collection Time: 09/01/17 12:46 AM  Result Value Ref Range Status   Specimen Description BLOOD BLOOD LEFT HAND  Final   Special Requests   Final    BOTTLES DRAWN AEROBIC AND ANAEROBIC Blood Culture adequate volume   Culture NO GROWTH 5 DAYS  Final   Report Status 09/06/2017 FINAL  Final  Culture, blood (Routine X 2) w Reflex to ID Panel     Status: None   Collection Time: 09/01/17  1:00 AM  Result Value Ref Range Status   Specimen Description BLOOD RIGHT ANTECUBITAL  Final   Special Requests   Final    BOTTLES DRAWN AEROBIC AND ANAEROBIC Blood Culture adequate volume   Culture NO GROWTH 5 DAYS  Final   Report Status 09/06/2017 FINAL  Final  CULTURE, BLOOD (ROUTINE X 2) w Reflex to ID Panel     Status: None   Collection Time: 09/04/17  6:15 PM  Result Value Ref Range Status   Specimen Description BLOOD RIGHT HAND  Final   Special Requests   Final    BOTTLES DRAWN AEROBIC AND ANAEROBIC Blood Culture adequate volume   Culture NO GROWTH 5 DAYS  Final   Report Status 09/09/2017 FINAL  Final  CULTURE, BLOOD (ROUTINE X 2) w Reflex to ID Panel     Status: None   Collection Time: 09/04/17  6:22 PM  Result Value Ref Range Status   Specimen Description BLOOD LEFT HAND  Final   Special Requests   Final    BOTTLES DRAWN AEROBIC AND ANAEROBIC Blood Culture adequate volume   Culture NO GROWTH 5 DAYS  Final   Report Status 09/09/2017 FINAL  Final  Urine Culture     Status: Abnormal   Collection Time: 09/05/17 12:43 AM  Result Value Ref Range Status   Specimen Description URINE, RANDOM  Final   Special Requests NONE  Final   Culture >=100,000 COLONIES/mL ENTEROCOCCUS FAECALIS (A)  Final   Report Status  09/07/2017 FINAL  Final   Organism ID, Bacteria ENTEROCOCCUS FAECALIS (A)  Final      Susceptibility   Enterococcus faecalis - MIC*    AMPICILLIN <=2 SENSITIVE Sensitive     LEVOFLOXACIN 1 SENSITIVE Sensitive     NITROFURANTOIN <=16 SENSITIVE Sensitive     VANCOMYCIN 1 SENSITIVE Sensitive     * >=100,000 COLONIES/mL ENTEROCOCCUS FAECALIS  Culture, respiratory (NON-Expectorated)     Status: None   Collection Time: 09/06/17  9:00 AM  Result Value Ref Range Status   Specimen Description TRACHEAL ASPIRATE  Final   Special Requests NONE  Final   Gram Stain   Final    ABUNDANT WBC PRESENT,BOTH PMN AND MONONUCLEAR FEW GRAM VARIABLE ROD    Culture   Final    Consistent with normal respiratory flora. Performed at [facility], [place] [address], [place], [place] [address]    Report Status 09/08/2017 FINAL  Final    RADIOLOGY:  Dg Chest Port 1 View  Result Date: 09/09/2017 CLINICAL DATA:  Suspected aspiration,, follow-up study EXAM: PORTABLE CHEST 1 VIEW COMPARISON:  Portable chest x-ray of September 06, 2017 FINDINGS: The right lung is mildly hypoinflated. There is persistent increased density at the right lung base medially which is more conspicuous today. The right hemidiaphragm remains mildly elevated. The left lung is better inflated than  the right. There is persistent density in the retrocardiac region. The heart is top-normal in size. The pulmonary vascularity is normal. IMPRESSION: Worsening infiltrate or atelectasis at the right lung base. Stable left basilar atelectasis or pneumonia. Electronically Signed   By: David  Martinique M.D.   On: 09/09/2017 11:58     Management plans discussed with the patient, family and they are in agreement.  CODE STATUS:     Code Status Orders  (From admission, onward)        Start     Ordered   08/27/17 0250  Full code  Continuous     08/27/17 0249    Code Status History    Date Active Date Inactive Code Status Order ID Comments User  Context   This patient has a current code status but no historical code status.      TOTAL TIME TAKING CARE OF THIS PATIENT: 42 minutes.    Gladstone Lighter M.D on 09/09/2017 at 1:01 PM  Between 7am to 6pm - Pager - [phone]  After 6pm go to [email or web address] - Proofreader  [facility]  Hospitalists  Office  [phone]  CC: Primary care physician; System, Pcp Not In   Note: This dictation was prepared with Dragon dictation along with smaller phrase technology. Any transcriptional errors that result from this process are unintentional.

## 2017-09-09 NOTE — Care Management (Addendum)
Spoke with L[place]y with [facility] regarding- concern over "capacity". Psych has consulted this day and will document at present, patient does not have capacity. Accepting physician is Vincente Liberty and room 317.  Updated primary nurse and provided number for report.  Instructed to contact [facility] for transport.  [facility] informed CM that they will pull the discharge summary out of Epic and not need to fax. Dr Tressia Miners will enter actual discharge order shortly

## 2017-10-08 DEATH — deceased

## 2019-06-27 IMAGING — DX DG CHEST 1V PORT
1 series · 1 of 1 positions shown · non-contrast
Comparison: 09/05/2017.

CLINICAL DATA: Hypoxia.

EXAM:
PORTABLE CHEST 1 VIEW

[chest ap]
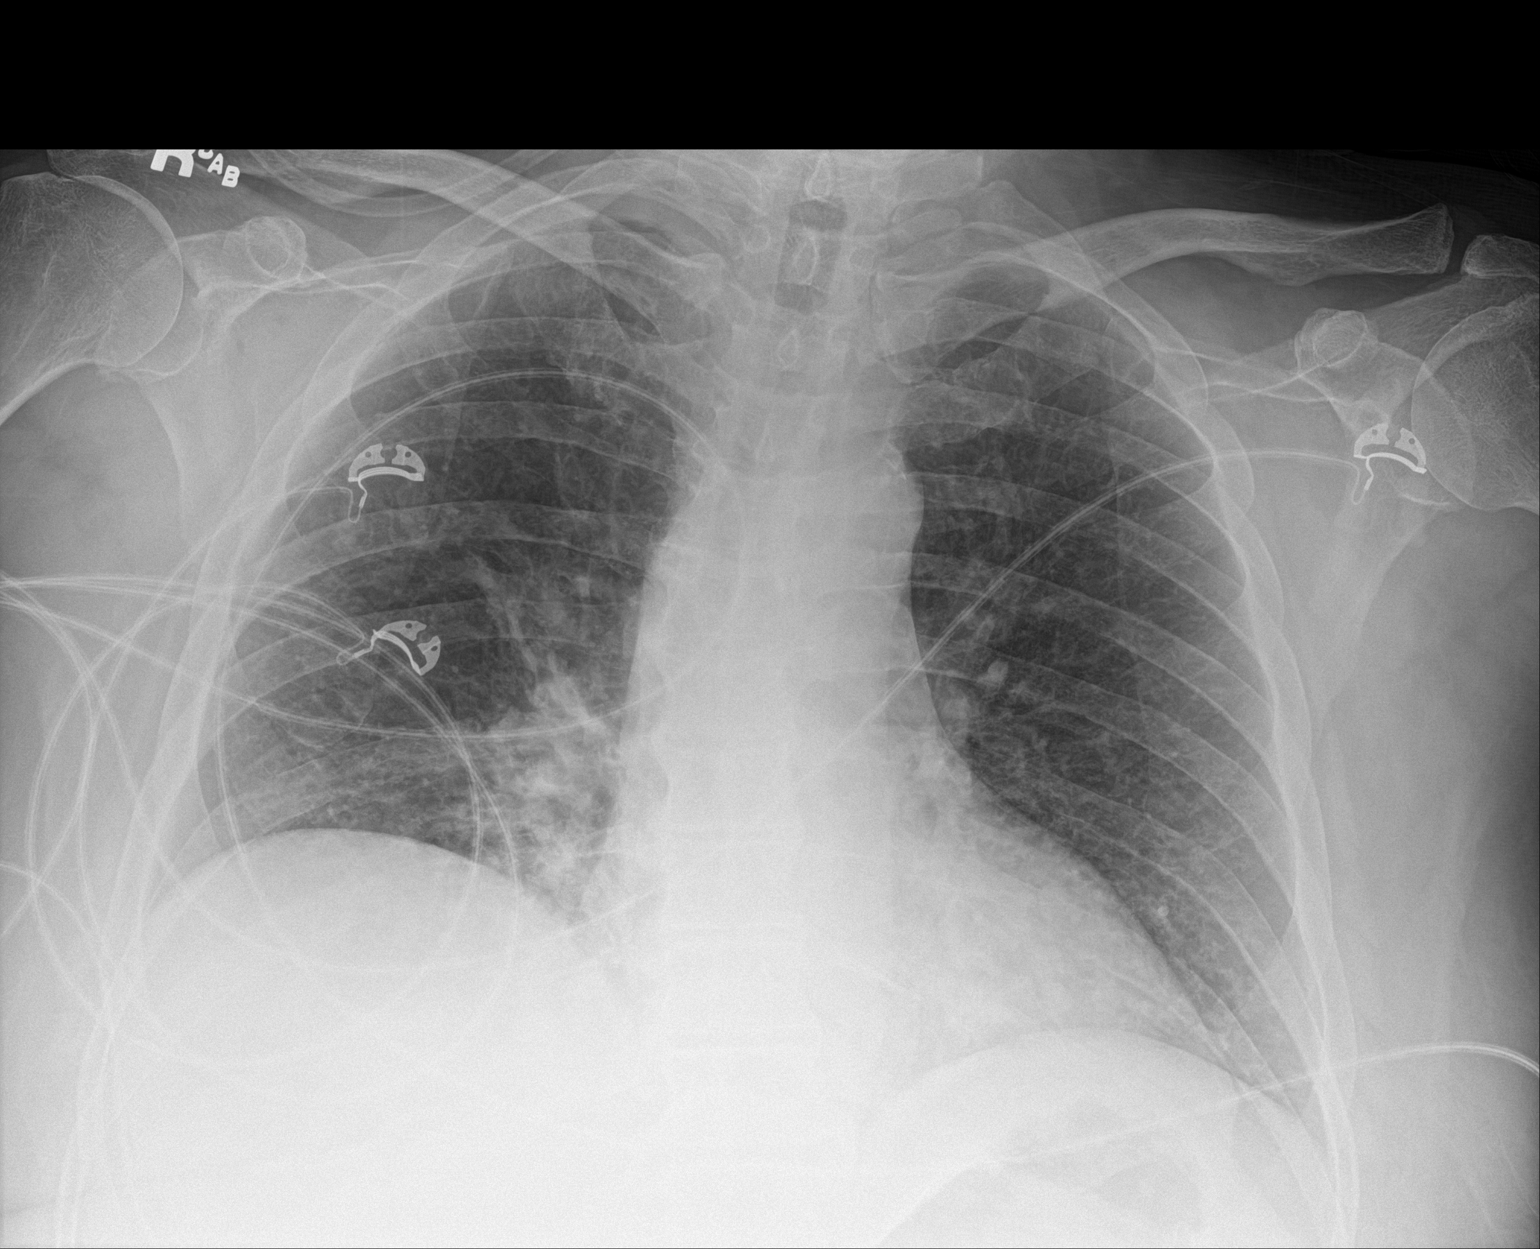

[1 of 1 positions shown; findings below may reference images not displayed]

FINDINGS: Mediastinum and hilar structures normal. Cardiomegaly. Persistent
bibasilar infiltrates. No significant change from prior exam. No
pleural effusion or pneumothorax.
IMPRESSION: Persistent bibasilar infiltrates.  No change from prior exam.

## 2019-06-28 IMAGING — DX DG ABDOMEN 1V
1 series · 1 of 1 positions shown · non-contrast
Comparison: None.

CLINICAL DATA: NG tube placement

EXAM:
ABDOMEN - 1 VIEW

[abdomen kub]
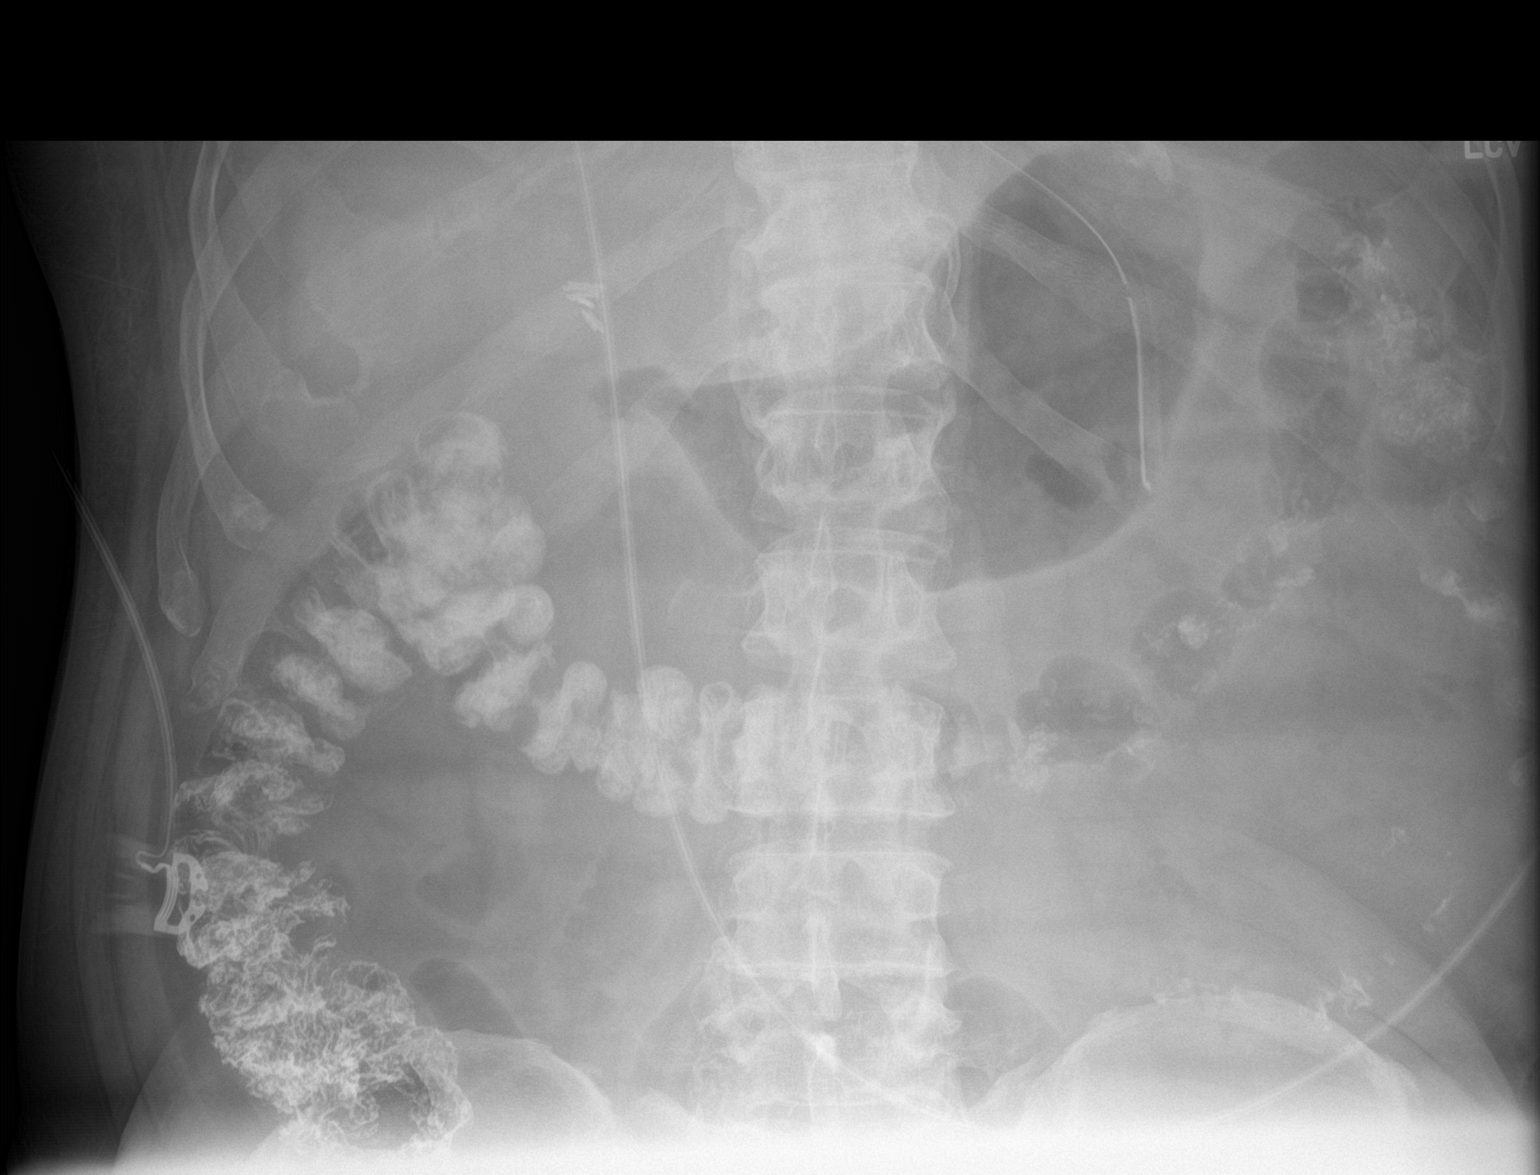

[1 of 1 positions shown; findings below may reference images not displayed]

FINDINGS: NG tube tip is in the mid stomach. Oral contrast material within
nondistended colon.
IMPRESSION: NG tube tip in the mid stomach.
